# Patient Record
Sex: Male | Born: 1955
Health system: Southern US, Community
[De-identification: ages and names within clinical notes are randomized; demographics above are authoritative.]

## PROBLEM LIST (undated history)

## (undated) DIAGNOSIS — J449 Chronic obstructive pulmonary disease, unspecified: Secondary | ICD-10-CM

## (undated) DIAGNOSIS — R7303 Prediabetes: Secondary | ICD-10-CM

## (undated) DIAGNOSIS — J189 Pneumonia, unspecified organism: Secondary | ICD-10-CM

## (undated) DIAGNOSIS — Z87442 Personal history of urinary calculi: Secondary | ICD-10-CM

## (undated) DIAGNOSIS — N200 Calculus of kidney: Secondary | ICD-10-CM

## (undated) DIAGNOSIS — T7840XA Allergy, unspecified, initial encounter: Secondary | ICD-10-CM

## (undated) DIAGNOSIS — G4733 Obstructive sleep apnea (adult) (pediatric): Secondary | ICD-10-CM

## (undated) DIAGNOSIS — D649 Anemia, unspecified: Secondary | ICD-10-CM

## (undated) DIAGNOSIS — G4730 Sleep apnea, unspecified: Secondary | ICD-10-CM

## (undated) DIAGNOSIS — N281 Cyst of kidney, acquired: Secondary | ICD-10-CM

## (undated) HISTORY — DX: Anemia, unspecified: D64.9

## (undated) HISTORY — DX: Allergy, unspecified, initial encounter: T78.40XA

## (undated) HISTORY — DX: Sleep apnea, unspecified: G47.30

## (undated) HISTORY — PX: TONSILLECTOMY: SUR1361

---

## 1994-04-28 HISTORY — PX: UVULOPALATOPHARYNGOPLASTY: SHX827

## 1998-04-07 ENCOUNTER — Ambulatory Visit: Admission: RE | Admit: 1998-04-07 | Discharge: 1998-04-07 | Payer: Self-pay | Admitting: *Deleted

## 1998-06-08 ENCOUNTER — Ambulatory Visit (HOSPITAL_COMMUNITY): Admission: RE | Admit: 1998-06-08 | Discharge: 1998-06-09 | Payer: Self-pay | Admitting: *Deleted

## 1998-09-29 ENCOUNTER — Ambulatory Visit: Admission: RE | Admit: 1998-09-29 | Discharge: 1998-09-29 | Payer: Self-pay | Admitting: *Deleted

## 1999-09-02 ENCOUNTER — Encounter: Payer: Self-pay | Admitting: Family Medicine

## 1999-09-02 ENCOUNTER — Encounter: Admission: RE | Admit: 1999-09-02 | Discharge: 1999-09-02 | Payer: Self-pay | Admitting: Family Medicine

## 2000-01-27 HISTORY — PX: OTHER SURGICAL HISTORY: SHX169

## 2000-02-24 ENCOUNTER — Encounter: Payer: Self-pay | Admitting: Emergency Medicine

## 2000-02-24 ENCOUNTER — Observation Stay (HOSPITAL_COMMUNITY): Admission: EM | Admit: 2000-02-24 | Discharge: 2000-02-25 | Payer: Self-pay | Admitting: Urology

## 2000-04-19 ENCOUNTER — Emergency Department (HOSPITAL_COMMUNITY): Admission: EM | Admit: 2000-04-19 | Discharge: 2000-04-19 | Payer: Self-pay | Admitting: Emergency Medicine

## 2000-04-20 ENCOUNTER — Emergency Department (HOSPITAL_COMMUNITY): Admission: EM | Admit: 2000-04-20 | Discharge: 2000-04-20 | Payer: Self-pay | Admitting: Emergency Medicine

## 2002-07-06 ENCOUNTER — Encounter: Payer: Self-pay | Admitting: Family Medicine

## 2002-07-06 ENCOUNTER — Encounter: Admission: RE | Admit: 2002-07-06 | Discharge: 2002-07-06 | Payer: Self-pay | Admitting: Family Medicine

## 2011-02-24 ENCOUNTER — Encounter: Payer: 59 | Attending: Family Medicine | Admitting: *Deleted

## 2011-02-24 ENCOUNTER — Encounter: Payer: Self-pay | Admitting: *Deleted

## 2011-02-24 DIAGNOSIS — Z713 Dietary counseling and surveillance: Secondary | ICD-10-CM | POA: Insufficient documentation

## 2011-02-24 NOTE — Patient Instructions (Addendum)
Goals:  1500-1600 calories per day choosing more whole grains, lean protein, low fat dairy, and fruits/veggies.  Eat 3 meals and 2 snacks a day, Avoid meal skipping.   Increase protein rich foods to all meals/snacks.   Aim for >30 min of physical activity daily (as able).  Limit sugar-sweetened beverages, concentrated sweets, and high fat/fried foods.

## 2011-02-24 NOTE — Progress Notes (Signed)
  Medical Nutrition Therapy:  Appt start time: 1100 end time:  1200.  Assessment:  Obesity/Wt Mgmt.  Pt works night shift 3 days/wk and reports daily meal skipping d/t decreased appetite since starting Nexium.  Getting over pneumonia and slight wheezing noted in office.  Pt reports many recent dietary changes, though still consume excessive CHO as sweet tea and cranberry juice on a daily basis.  Excessive sodium intake also noted through frozen/processed foods and meals away from home.  Reports previous exercise of 20-30 min daily, but none since developing pneumonia.  Some pain (2/5) noted at this time.  MEDICATIONS: Dulera, Nexium, Nasonex   DIETARY INTAKE:  Usual eating pattern includes 2 meals and 1 snack per day.  Avoids spicy foods and very high fat/fried foods d/t GERD.   24-hr recall:  B (8-8:30 AM): Wheetabix cereal w/ 2% milk, small banana; sweet tea or cj Snk ( AM): None - sleeping  L ( PM): Lean Cuisine OR pb sandwiches, (prev = 12" steak and cheese - [employer] Snk ( PM): graham crackers; swt tea, water, or cj w/ water D ( PM): Brown rice cup Snk ( PM): none Beverages: cranberry juice w/ water, sweet tea   Usual physical activity: None  Estimated energy needs: 1500-1600 calories 185-200 g carbohydrates 110-120 g protein 35 g fat  Progress Towards Goal(s):  In progress.   Nutritional Diagnosis:  [place]-3.3 Morbid obesity related to history of excessive CHO and fat intake as evidenced by patient reported dietary intake history, 24-hr recall, and a BMI of 41.9 kg/m^2.    Intervention/Goals:  1500-1600 calories per day choosing more whole grains, lean protein, low fat dairy, and fruits/veggies.  Eat 3 meals and 2 snacks a day, Avoid meal skipping.   Increase protein rich foods to all meals/snacks.   Aim for >30 min of physical activity daily (as able).  Limit sugar-sweetened beverages, concentrated sweets, and high fat/fried foods.  Monitoring/Evaluation:  Dietary intake,  exercise, and body weight in 3 month(s).

## 2011-02-25 ENCOUNTER — Encounter: Payer: Self-pay | Admitting: *Deleted

## 2011-03-11 ENCOUNTER — Encounter (HOSPITAL_COMMUNITY): Payer: Self-pay | Admitting: Emergency Medicine

## 2011-03-11 ENCOUNTER — Emergency Department (HOSPITAL_COMMUNITY): Payer: 59

## 2011-03-11 ENCOUNTER — Emergency Department (HOSPITAL_COMMUNITY)
Admission: EM | Admit: 2011-03-11 | Discharge: 2011-03-11 | Disposition: A | Discharge status: Home / Self Care | Payer: 59 | Attending: Emergency Medicine | Admitting: Emergency Medicine

## 2011-03-11 DIAGNOSIS — J4489 Other specified chronic obstructive pulmonary disease: Secondary | ICD-10-CM | POA: Insufficient documentation

## 2011-03-11 DIAGNOSIS — R109 Unspecified abdominal pain: Secondary | ICD-10-CM | POA: Insufficient documentation

## 2011-03-11 DIAGNOSIS — J984 Other disorders of lung: Secondary | ICD-10-CM | POA: Insufficient documentation

## 2011-03-11 DIAGNOSIS — N200 Calculus of kidney: Secondary | ICD-10-CM | POA: Insufficient documentation

## 2011-03-11 DIAGNOSIS — G4730 Sleep apnea, unspecified: Secondary | ICD-10-CM | POA: Insufficient documentation

## 2011-03-11 DIAGNOSIS — R911 Solitary pulmonary nodule: Secondary | ICD-10-CM

## 2011-03-11 DIAGNOSIS — J449 Chronic obstructive pulmonary disease, unspecified: Secondary | ICD-10-CM | POA: Insufficient documentation

## 2011-03-11 DIAGNOSIS — R110 Nausea: Secondary | ICD-10-CM | POA: Insufficient documentation

## 2011-03-11 DIAGNOSIS — Z87442 Personal history of urinary calculi: Secondary | ICD-10-CM | POA: Insufficient documentation

## 2011-03-11 LAB — URINALYSIS, ROUTINE W REFLEX MICROSCOPIC
Bilirubin Urine: NEGATIVE
Glucose, UA: NEGATIVE mg/dL
Ketones, ur: NEGATIVE mg/dL
Leukocytes, UA: NEGATIVE
Nitrite: NEGATIVE
Protein, ur: NEGATIVE mg/dL
Specific Gravity, Urine: 1.02 (ref 1.005–1.030)
Urobilinogen, UA: 0.2 mg/dL (ref 0.0–1.0)
pH: 5.5 (ref 5.0–8.0)

## 2011-03-11 LAB — URINE MICROSCOPIC-ADD ON

## 2011-03-11 LAB — POCT I-STAT, CHEM 8
BUN: 13 mg/dL (ref 6–23)
Calcium, Ion: 1.11 mmol/L — ABNORMAL LOW (ref 1.12–1.32)
Chloride: 109 mEq/L (ref 96–112)

## 2011-03-11 MED ORDER — ONDANSETRON HCL 4 MG/2ML IJ SOLN
4.0000 mg | Freq: Once | INTRAMUSCULAR | Status: AC
  Administered 2011-03-11: 4 mg via INTRAVENOUS
  Filled 2011-03-11: qty 2

## 2011-03-11 MED ORDER — OXYCODONE-ACETAMINOPHEN 5-325 MG PO TABS
2.0000 | ORAL_TABLET | Freq: Once | ORAL | Status: AC
  Administered 2011-03-11: 2 via ORAL
  Filled 2011-03-11: qty 2

## 2011-03-11 MED ORDER — OXYCODONE-ACETAMINOPHEN 5-325 MG PO TABS: 2.0000 | ORAL_TABLET | ORAL | Status: AC | PRN

## 2011-03-11 MED ORDER — KETOROLAC TROMETHAMINE 30 MG/ML IJ SOLN
30.0000 mg | Freq: Once | INTRAMUSCULAR | Status: AC
  Administered 2011-03-11: 30 mg via INTRAVENOUS
  Filled 2011-03-11: qty 1

## 2011-03-11 MED ORDER — TAMSULOSIN HCL 0.4 MG PO CAPS: 0.4000 mg | ORAL_CAPSULE | Freq: Every day | ORAL | Status: DC

## 2011-03-11 NOTE — ED Provider Notes (Signed)
History     CSN: [id number] Arrival date & time: 03/11/2011  1:41 AM   First MD Initiated Contact with Patient 03/11/11 0153      Chief Complaint  Patient presents with  . Flank Pain   HPI History provided by the patient. Patient presents with complaints of acute onset left lower flank and groin pain similar to previous kidney stone symptoms. 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 Patient works as a Buyer, retail. Pain then became acutely more severe about 30 minutes prior to coming into the ED. She reports slight nausea but denies any vomiting. No fever, chills, sweats, constipation, diarrhea. Patient denies any dysuria or hematuria. Patient has no other significant medical history.   Past Medical History  Diagnosis Date  . Sleep apnea   . Obesity, Class III, BMI 40-49.9 (morbid obesity)   . COPD (chronic obstructive pulmonary disease)   . Kidney stones   . Sleep apnea     Past Surgical History  Procedure Date  . Rhinoplasty     Family History  Problem Relation Age of Onset  . Cancer Other   . Obesity Other     History  Substance Use Topics  . Smoking status: Former Smoker    Types: Cigarettes  . Smokeless tobacco: Not on file  . Alcohol Use: No      Review of Systems  Constitutional: Negative for fever and chills.  Respiratory: Negative for cough and shortness of breath.   Cardiovascular: Negative for chest pain.  Gastrointestinal: Negative for nausea, vomiting, abdominal pain, diarrhea and constipation.  Genitourinary: Positive for flank pain. Negative for dysuria, frequency and hematuria.  Skin: Negative for rash.  All other systems reviewed and are negative.    Allergies  Review of patient's allergies indicates no known allergies.  Home Medications   Current Outpatient Rx  Name Route Sig Dispense Refill  . ESOMEPRAZOLE MAGNESIUM 40 MG PO CPDR Oral Take 40 mg by mouth daily before breakfast.      . MOMETASONE FUROATE 50 MCG/ACT NA SUSP Nasal Place 2 sprays into the nose daily.     . MOMETASONE FURO-FORMOTEROL FUM 100-5 MCG/ACT IN AERO Inhalation Inhale 2 puffs into the lungs 2 (two) times daily.       BP 111/56  Pulse 63  Temp(Src) 98.5 F (36.9 C) (Oral)  Resp 18  SpO2 98%  Physical Exam  Nursing note and vitals reviewed. Constitutional: He is oriented to person, place, and time. He appears well-developed and well-nourished. No distress.       Uncomfortable appearing  HENT:  Head: Normocephalic and atraumatic.  Cardiovascular: Normal rate and regular rhythm.   No murmur heard. Pulmonary/Chest: Effort normal and breath sounds normal. He has no wheezes. He has no rales.  Abdominal: Soft. Bowel sounds are normal. There is tenderness in the left lower quadrant. There is no rebound, no guarding and no CVA tenderness.  Neurological: He is alert and oriented to person, place, and time.  Skin: Skin is warm. No rash noted.  Psychiatric: He has a normal mood and affect.    ED Course  Procedures (including critical care time)  Labs Reviewed  POCT I-STAT, CHEM 8 - Abnormal; Notable for the following:    Glucose, Bld 157 (*)    Calcium, Ion 1.11 (*)    All other components within normal limits  URINALYSIS, ROUTINE W REFLEX MICROSCOPIC  I-STAT, CHEM 8    Results for orders placed during  the hospital encounter of 03/11/11  URINALYSIS, ROUTINE W REFLEX MICROSCOPIC      Component Value Range   Color, Urine YELLOW  YELLOW    Appearance CLEAR  CLEAR    Specific Gravity, Urine 1.020  1.005 - 1.030    pH 5.5  5.0 - 8.0    Glucose, UA NEGATIVE  NEGATIVE (mg/dL)   Hgb urine dipstick LARGE (*) NEGATIVE    Bilirubin Urine NEGATIVE  NEGATIVE    Ketones, ur NEGATIVE  NEGATIVE (mg/dL)   Protein, ur NEGATIVE  NEGATIVE (mg/dL)   Urobilinogen, UA 0.2  0.0 - 1.0 (mg/dL)   Nitrite NEGATIVE  NEGATIVE    Leukocytes, UA NEGATIVE  NEGATIVE   POCT I-STAT, CHEM 8      Component Value Range    Sodium 144  135 - 145 (mEq/L)   Potassium 3.7  3.5 - 5.1 (mEq/L)   Chloride 109  96 - 112 (mEq/L)   BUN 13  6 - 23 (mg/dL)   Creatinine, Ser 1.61  0.50 - 1.35 (mg/dL)   Glucose, Bld 096 (*) 70 - 99 (mg/dL)   Calcium, Ion 0.45 (*) 1.12 - 1.32 (mmol/L)   TCO2 25  0 - 100 (mmol/L)   Hemoglobin 13.6  13.0 - 17.0 (g/dL)   HCT 40.9  81.1 - 91.4 (%)  URINE MICROSCOPIC-ADD ON      Component Value Range   Squamous Epithelial / LPF FEW (*) RARE    WBC, UA 0-2  <3 (WBC/hpf)   RBC / HPF 21-50  <3 (RBC/hpf)   Bacteria, UA FEW (*) RARE    Urine-Other MUCOUS PRESENT       Ct Abdomen Pelvis Wo Contrast  03/11/2011  *RADIOLOGY REPORT*  Clinical Data: Left-sided flank pain; difficulty urinating.  CT ABDOMEN AND PELVIS WITHOUT CONTRAST  Technique:  Multidetector CT imaging of the abdomen and pelvis was performed following the standard protocol without intravenous contrast.  Comparison: None.  Findings: Minimal bibasilar atelectasis is noted.  Scattered small peripheral nodules are noted at the left lung base; these measure up to 7 mm in size (images 1, 3 and 12 of 25).  Some of this may reflect nodular atelectasis.  The liver and spleen are unremarkable in appearance.  The gallbladder is within normal limits.  The pancreas and adrenal glands are unremarkable.  There is mild left-sided hydronephrosis, with left-sided perinephric stranding and fluid, and prominence of the left ureter to the level of an obstructing 6 x 3 mm stone in the distal left ureter, 3 cm proximal to the left vesicoureteral junction.  Diffuse stranding is noted along the course of the left ureter.  Given the degree of soft tissue stranding, underlying pyelonephritis cannot be excluded.  A 2.1 cm hypodensity within the interpole region of the right kidney likely reflects a cyst.  The right kidney is otherwise unremarkable in appearance.  No nonobstructing renal stones are seen on either side.  No free fluid is identified.  The small bowel is  unremarkable in appearance.  The stomach is within normal limits.  No acute vascular abnormalities are seen.  There is nonspecific mild diffuse haziness within the proximal mesentery; no significant mesenteric lymphadenopathy is seen.  The appendix is normal in caliber, without evidence for appendicitis.  The colon is relatively decompressed and is unremarkable in appearance.  The bladder is mildly distended; mild soft tissue stranding about the bladder raises question for cystitis.  The prostate remains normal in size, with minimal calcification.  No inguinal  lymphadenopathy is seen.  No acute osseous abnormalities are identified.  IMPRESSION:  1.  Mild left-sided hydronephrosis, with left-sided perinephric stranding and fluid, and an obstructing 6 x 3 mm stone in the distal left ureter, 3 cm proximal to the left vesicoureteral junction. 2.  Given the degree of soft tissue stranding along the left ureter and about the kidney, underlying pyelonephritis and ureteritis cannot be excluded. 3.  Suspect cystitis, given mild soft tissue stranding about the bladder. 4.  Right renal cyst noted. 5.  Nonspecific mild diffuse haziness within the proximal mesentery; no evidence of lymphadenopathy or mass. 6.  Small peripheral nodules noted at the left lung base, measuring up to 7 mm in size.  If the patient is at high risk for bronchogenic carcinoma, follow-up chest CT at 3-6 months is recommended.  If the patient is at low risk for bronchogenic carcinoma, follow-up chest CT at 6-12 months is recommended.  This recommendation follows the consensus statement: Guidelines for Management of Small Pulmonary Nodules Detected on CT Scans: A Statement from the Fleischner Society as published in Radiology 2005; 237:395-400. Online at: DietDisorder.cz.  Original Report Authenticated By: Tonia Ghent, M.D.     1. Kidney stone   2. Lung nodule       MDM  Patient seen and evaluated. In no  acute distress. Will begin workup to rule out possible kidney stone.  Patient feeling better after Toradol. He has been resting comfortably and asleep. CT scan shows mild left hydronephrosis. There is a distal 6 x 3 mm stone in the left ureter.     Angus Seller, [place] 03/11/11 [phone]

## 2011-03-11 NOTE — ED Notes (Signed)
Alert, interactive, calm, intermittantly resting, "pain returning", pain med given, urine sent. Pending disposition & urine results.

## 2011-03-11 NOTE — ED Provider Notes (Signed)
Medical screening examination/treatment/procedure(s) were performed by non-physician practitioner and as supervising physician I was immediately available for consultation/collaboration.   Lyanne Co, MD 03/11/11 [phone]

## 2011-03-11 NOTE — ED Notes (Signed)
Back from CT

## 2011-03-11 NOTE — ED Notes (Signed)
PT. REPORTS LEFT FLANK PAIN WITH NAUSEA ONSET THIS EVENING , DENIES DYSURIA OR HEMATURIA .  NO DIARRHEA ,FEVER OR CHILLS.

## 2011-03-13 LAB — URINE CULTURE: Culture  Setup Time: 201211140735

## 2011-05-27 ENCOUNTER — Ambulatory Visit: Payer: 59 | Admitting: *Deleted

## 2011-12-04 ENCOUNTER — Encounter (HOSPITAL_COMMUNITY): Payer: Self-pay

## 2011-12-04 ENCOUNTER — Emergency Department (INDEPENDENT_AMBULATORY_CARE_PROVIDER_SITE_OTHER)
Admission: EM | Admit: 2011-12-04 | Discharge: 2011-12-04 | Disposition: A | Discharge status: Home / Self Care | Payer: 59 | Source: Home / Self Care | Attending: Emergency Medicine | Admitting: Emergency Medicine

## 2011-12-04 DIAGNOSIS — J40 Bronchitis, not specified as acute or chronic: Secondary | ICD-10-CM

## 2011-12-04 DIAGNOSIS — H109 Unspecified conjunctivitis: Secondary | ICD-10-CM

## 2011-12-04 MED ORDER — MOMETASONE FUROATE 50 MCG/ACT NA SUSP: 2.0000 | Freq: Every day | NASAL | Status: DC

## 2011-12-04 MED ORDER — DOXYCYCLINE HYCLATE 100 MG PO CAPS: 100.0000 mg | ORAL_CAPSULE | Freq: Two times a day (BID) | ORAL | Status: AC

## 2011-12-04 MED ORDER — TOBRAMYCIN 0.3 % OP SOLN: 1.0000 [drp] | Freq: Four times a day (QID) | OPHTHALMIC | Status: AC

## 2011-12-04 NOTE — ED Provider Notes (Signed)
History     CSN: [id number]  Arrival date & time 12/04/11  1101   First MD Initiated Contact with Patient 12/04/11 1106      Chief Complaint  Patient presents with  . Sore Throat  . URI  . Eye Problem    (Consider location/radiation/quality/duration/timing/severity/associated sxs/prior treatment) HPI Comments: Patient presents urgent care this morning complaining of a sore throat nasal congestion drainage and redness of both of his eyes he is been having a productive cough of green yellowish sputum for several days now. Also experiencing some nasal congestion and minimal pressure on both maxillary sinuses secretions from his nose have also look greenish in character. Have been taking Zyrtec without any improvement.  Patient is a 56 y.o. male presenting with pharyngitis, URI, and eye problem. The history is provided by the patient.  Sore Throat This is a new problem. The current episode started more than 2 days ago. The problem occurs constantly. The problem has been gradually worsening. Pertinent negatives include no chest pain, no abdominal pain, no headaches and no shortness of breath. He has tried nothing for the symptoms. The treatment provided no relief.  URI The primary symptoms include sore throat and cough. Primary symptoms do not include fever, fatigue, headaches, abdominal pain, myalgias, arthralgias or rash.  The sore throat is not accompanied by trouble swallowing.  Symptoms associated with the illness include congestion. The illness is not associated with chills or sinus pressure.  Eye Problem  Pertinent negatives include no weakness.    Past Medical History  Diagnosis Date  . Sleep apnea   . Obesity, Class III, BMI 40-49.9 (morbid obesity)   . COPD (chronic obstructive pulmonary disease)   . Kidney stones   . Sleep apnea     Past Surgical History  Procedure Date  . Rhinoplasty     Family History  Problem Relation Age of Onset  . Cancer Other   . Obesity  Other     History  Substance Use Topics  . Smoking status: Former Smoker    Types: Cigarettes  . Smokeless tobacco: Not on file  . Alcohol Use: No      Review of Systems  Constitutional: Negative for fever, chills, activity change, appetite change and fatigue.  HENT: Positive for congestion and sore throat. Negative for mouth sores, trouble swallowing, dental problem, voice change and sinus pressure.   Respiratory: Positive for cough. Negative for apnea and shortness of breath.   Cardiovascular: Negative for chest pain.  Gastrointestinal: Negative for abdominal pain.  Musculoskeletal: Negative for myalgias and arthralgias.  Skin: Negative for rash.  Neurological: Negative for weakness and headaches.    Allergies  Review of patient's allergies indicates no known allergies.  Home Medications   Current Outpatient Rx  Name Route Sig Dispense Refill  . MOMETASONE FURO-FORMOTEROL FUM 100-5 MCG/ACT IN AERO Inhalation Inhale 2 puffs into the lungs 2 (two) times daily.     Marland Kitchen DOXYCYCLINE HYCLATE 100 MG PO CAPS Oral Take 1 capsule (100 mg total) by mouth 2 (two) times daily. 20 capsule 0  . ESOMEPRAZOLE MAGNESIUM 40 MG PO CPDR Oral Take 40 mg by mouth daily before breakfast.     . MOMETASONE FUROATE 50 MCG/ACT NA SUSP Nasal Place 2 sprays into the nose daily. 17 g 1  . TAMSULOSIN HCL 0.4 MG PO CAPS Oral Take 1 capsule (0.4 mg total) by mouth daily. 15 capsule 0  . TOBRAMYCIN SULFATE 0.3 % OP SOLN Both Eyes Place 1 drop into  both eyes every 6 (six) hours. 5 mL 0    BP 130/82  Pulse 88  Temp 98.7 F (37.1 C) (Oral)  Resp 20  SpO2 97%  Physical Exam  Vitals reviewed. Constitutional: He appears well-developed and well-nourished.  HENT:  Head: Normocephalic.  Mouth/Throat: Uvula is midline. Posterior oropharyngeal erythema present. No oropharyngeal exudate, posterior oropharyngeal edema or tonsillar abscesses.  Eyes: EOM are normal. Pupils are equal, round, and reactive to light.  No foreign bodies found. Right eye exhibits no discharge. Left eye exhibits no discharge. Right conjunctiva is injected. Right conjunctiva has no hemorrhage. Left conjunctiva is injected. Left conjunctiva has no hemorrhage. No scleral icterus.  Cardiovascular: Normal rate and regular rhythm.   Pulmonary/Chest: Effort normal and breath sounds normal. No respiratory distress. He has no wheezes. He has no rales. He exhibits no tenderness.    ED Course  Procedures (including critical care time)  Labs Reviewed - No data to display No results found.   1. Conjunctivitis   2. Bronchitis       MDM  Patient describes that he has been told by his primary care Dr. that he has mild COPD. Reports productive phlegm and with a clinical CONJUNCTIVITIS. Patient has been prescribed ophthalmic tobramycin along with a doxycycline prescription patient also requested a mometasone nasal spray refill. Advise patient about symptoms that should warrant further evaluation or she is to return        Jimmie Molly, MD 12/04/11 2046

## 2011-12-04 NOTE — ED Notes (Signed)
C/o sore throat, nasal congestion, redness and drainage both eyes and productive cough of green sputum.  Also reports yellowish-green nasal secretions.  Taking zyrtec without improvement.

## 2012-05-06 ENCOUNTER — Other Ambulatory Visit: Payer: Self-pay | Admitting: Physician Assistant

## 2012-05-06 NOTE — Telephone Encounter (Signed)
Chart pulled at [facility] [phone]

## 2012-05-10 ENCOUNTER — Other Ambulatory Visit: Payer: Self-pay | Admitting: Physician Assistant

## 2012-11-11 ENCOUNTER — Ambulatory Visit (INDEPENDENT_AMBULATORY_CARE_PROVIDER_SITE_OTHER): Payer: 59 | Admitting: Emergency Medicine

## 2012-11-11 VITALS — BP 107/75 | HR 73 | Temp 98.2°F | Resp 18 | Ht 69.5 in | Wt 292.0 lb

## 2012-11-11 DIAGNOSIS — N200 Calculus of kidney: Secondary | ICD-10-CM

## 2012-11-11 DIAGNOSIS — N3090 Cystitis, unspecified without hematuria: Secondary | ICD-10-CM

## 2012-11-11 DIAGNOSIS — N3000 Acute cystitis without hematuria: Secondary | ICD-10-CM

## 2012-11-11 LAB — POCT URINALYSIS DIPSTICK
Bilirubin, UA: NEGATIVE
Ketones, UA: NEGATIVE
pH, UA: 7

## 2012-11-11 LAB — POCT UA - MICROSCOPIC ONLY

## 2012-11-11 MED ORDER — PHENAZOPYRIDINE HCL 200 MG PO TABS: 200.0000 mg | ORAL_TABLET | Freq: Three times a day (TID) | ORAL | Status: DC | PRN

## 2012-11-11 MED ORDER — TAMSULOSIN HCL 0.4 MG PO CAPS
0.4000 mg | ORAL_CAPSULE | Freq: Every day | ORAL | Status: DC
Start: 2012-11-11 — End: 2014-07-29

## 2012-11-11 MED ORDER — CIPROFLOXACIN HCL 500 MG PO TABS: 500.0000 mg | ORAL_TABLET | Freq: Two times a day (BID) | ORAL | Status: DC

## 2012-11-11 NOTE — Progress Notes (Signed)
[facility] [address] [place] [address] [phone]  Date:  11/11/2012   Name:  Zachary Jacobs   DOB:  [birth year]   MRN:  [id number]  PCP:  No PCP Per Patient    Chief Complaint: Abdominal Pain and Urinary Retention   History of Present Illness:  Zachary Jacobs is a 57 y.o. very pleasant male patient who presents with the following:  History of kidney stones two times in the past.  Developed pain in the left flank Saturday that has been persistent until Tuesday when he passed two stones.  Now has persistent pain and aches and dark urine.  No fever or chills today.  Nauseated.  Concerned that he might have an infection.  No improvement with over the counter medications or other home remedies. Denies other complaint or health concern today.   There are no active problems to display for this patient.   Past Medical History  Diagnosis Date  . Sleep apnea   . Obesity, Class III, BMI 40-49.9 (morbid obesity)   . COPD (chronic obstructive pulmonary disease)   . Kidney stones   . Sleep apnea   . Sleep apnea     Past Surgical History  Procedure Laterality Date  . Rhinoplasty      History  Substance Use Topics  . Smoking status: Former Smoker    Types: Cigarettes  . Smokeless tobacco: Not on file  . Alcohol Use: No    Family History  Problem Relation Age of Onset  . Cancer Other   . Obesity Other   . Cancer Mother   . Heart disease Father     No Known Allergies  Medication list has been reviewed and updated.  Current Outpatient Prescriptions on File Prior to Visit  Medication Sig Dispense Refill  . esomeprazole (NEXIUM) 40 MG capsule Take 40 mg by mouth daily before breakfast.       . mometasone (NASONEX) 50 MCG/ACT nasal spray Place 2 sprays into the nose daily.  17 g  1  . mometasone-formoterol (DULERA) 100-5 MCG/ACT AERO Inhale 2 puffs into the lungs 2 (two) times daily.       . Tamsulosin HCl (FLOMAX) 0.4 MG CAPS Take 1  capsule (0.4 mg total) by mouth daily.  15 capsule  0   No current facility-administered medications on file prior to visit.    Review of Systems:  As per HPI, otherwise negative.    Physical Examination: Filed Vitals:   11/11/12 1421  BP: 107/75  Pulse: 73  Temp: 98.2 F (36.8 C)  Resp: 18   Filed Vitals:   11/11/12 1421  Height: 5' 9.5" (1.765 m)  Weight: 292 lb (132.45 kg)   Body mass index is 42.52 kg/(m^2). Ideal Body Weight: Weight in (lb) to have BMI = 25: 171.4  GEN: WDWN, NAD, Non-toxic, A & O x 3 HEENT: Atraumatic, Normocephalic. Neck supple. No masses, No LAD. Ears and Nose: No external deformity. CV: RRR, No M/G/R. No JVD. No thrill. No extra heart sounds. PULM: CTA B, no wheezes, crackles, rhonchi. No retractions. No resp. distress. No accessory muscle use. ABD: S, NT, ND, +BS. No rebound. No HSM. EXTR: No c/c/e NEURO Normal gait.  PSYCH: Normally interactive. Conversant. Not depressed or anxious appearing.  Calm demeanor.    Assessment and Plan: Cystitis cipro Pyridium   Signed,  Phillips Odor, MD   Results for orders placed in visit on 11/11/12  POCT URINALYSIS DIPSTICK  Result Value Range   Color, UA orange     Clarity, UA turbin     Glucose, UA neg     Bilirubin, UA neg     Ketones, UA neg     Spec Grav, UA 1.020     Blood, UA mod     pH, UA 7.0     Protein, UA 100     Urobilinogen, UA 0.2     Nitrite, UA neg     Leukocytes, UA large (3+)    POCT UA - MICROSCOPIC ONLY      Result Value Range   WBC, Ur, HPF, POC tntc     RBC, urine, microscopic tntc     Bacteria, U Microscopic 2+     Mucus, UA mod     Epithelial cells, urine per micros 0-2     Crystals, Ur, HPF, POC neg     Casts, Ur, LPF, POC neg     Yeast, UA neg

## 2012-11-11 NOTE — Addendum Note (Signed)
Addended by: Carmelina Dane on: 11/11/2012 03:17 PM   Modules accepted: Orders

## 2012-11-11 NOTE — Patient Instructions (Addendum)
Urinary Tract Infection  Urinary tract infections (UTIs) can develop anywhere along your urinary tract. Your urinary tract is your body's drainage system for removing wastes and extra water. Your urinary tract includes two kidneys, two ureters, a bladder, and a urethra. Your kidneys are a pair of bean-shaped organs. Each kidney is about the size of your fist. They are located below your ribs, one on each side of your spine.  CAUSES  Infections are caused by microbes, which are microscopic organisms, including fungi, viruses, and bacteria. These organisms are so small that they can only be seen through a microscope. Bacteria are the microbes that most commonly cause UTIs.  SYMPTOMS   Symptoms of UTIs may vary by age and gender of the patient and by the location of the infection. Symptoms in young women typically include a frequent and intense urge to urinate and a painful, burning feeling in the bladder or urethra during urination. Older women and men are more likely to be tired, shaky, and weak and have muscle aches and abdominal pain. A fever may mean the infection is in your kidneys. Other symptoms of a kidney infection include pain in your back or sides below the ribs, nausea, and vomiting.  DIAGNOSIS  To diagnose a UTI, your caregiver will ask you about your symptoms. Your caregiver also will ask to provide a urine sample. The urine sample will be tested for bacteria and white blood cells. White blood cells are made by your body to help fight infection.  TREATMENT   Typically, UTIs can be treated with medication. Because most UTIs are caused by a bacterial infection, they usually can be treated with the use of antibiotics. The choice of antibiotic and length of treatment depend on your symptoms and the type of bacteria causing your infection.  HOME CARE INSTRUCTIONS   If you were prescribed antibiotics, take them exactly as your caregiver instructs you. Finish the medication even if you feel better after you  have only taken some of the medication.   Drink enough water and fluids to keep your urine clear or pale yellow.   Avoid caffeine, tea, and carbonated beverages. They tend to irritate your bladder.   Empty your bladder often. Avoid holding urine for long periods of time.   Empty your bladder before and after sexual intercourse.   After a bowel movement, women should cleanse from front to back. Use each tissue only once.  SEEK MEDICAL CARE IF:    You have back pain.   You develop a fever.   Your symptoms do not begin to resolve within 3 days.  SEEK IMMEDIATE MEDICAL CARE IF:    You have severe back pain or lower abdominal pain.   You develop chills.   You have nausea or vomiting.   You have continued burning or discomfort with urination.  MAKE SURE YOU:    Understand these instructions.   Will watch your condition.   Will get help right away if you are not doing well or get worse.  Document Released: 01/22/2005 Document Revised: 10/14/2011 Document Reviewed: 05/23/2011  [employer] Patient Information 2014 [employer]

## 2013-06-26 ENCOUNTER — Ambulatory Visit (INDEPENDENT_AMBULATORY_CARE_PROVIDER_SITE_OTHER): Payer: 59 | Admitting: Emergency Medicine

## 2013-06-26 VITALS — BP 136/74 | HR 68 | Temp 98.1°F | Resp 18 | Ht 69.0 in | Wt 300.6 lb

## 2013-06-26 DIAGNOSIS — H66009 Acute suppurative otitis media without spontaneous rupture of ear drum, unspecified ear: Secondary | ICD-10-CM

## 2013-06-26 MED ORDER — AMOXICILLIN-POT CLAVULANATE 875-125 MG PO TABS: 1.0000 | ORAL_TABLET | Freq: Two times a day (BID) | ORAL | Status: DC

## 2013-06-26 MED ORDER — ESOMEPRAZOLE MAGNESIUM 40 MG PO CPDR: 40.0000 mg | DELAYED_RELEASE_CAPSULE | Freq: Every day | ORAL | Status: DC

## 2013-06-26 MED ORDER — PSEUDOEPHEDRINE-GUAIFENESIN ER 60-600 MG PO TB12: 1.0000 | ORAL_TABLET | Freq: Two times a day (BID) | ORAL | Status: DC

## 2013-06-26 MED ORDER — MOMETASONE FURO-FORMOTEROL FUM 100-5 MCG/ACT IN AERO: 2.0000 | INHALATION_SPRAY | Freq: Two times a day (BID) | RESPIRATORY_TRACT | Status: DC

## 2013-06-26 MED ORDER — PSEUDOEPHEDRINE-GUAIFENESIN ER 60-600 MG PO TB12: 1.0000 | ORAL_TABLET | Freq: Two times a day (BID) | ORAL | Status: AC

## 2013-06-26 MED ORDER — ACETAMINOPHEN-CODEINE #3 300-30 MG PO TABS: 1.0000 | ORAL_TABLET | ORAL | Status: DC | PRN

## 2013-06-26 NOTE — Patient Instructions (Signed)
Otitis Media With Effusion Otitis media with effusion is the presence of fluid in the middle ear. This is a common problem in children, which often follows ear infections. It may be present for weeks or longer after the infection. Unlike an acute ear infection, otitis media with effusion refers only to fluid behind the ear drum and not infection. Children with repeated ear and sinus infections and allergy problems are the most likely to get otitis media with effusion. CAUSES  The most frequent cause of the fluid buildup is dysfunction of the eustachian tubes. These are the tubes that drain fluid in the ears to the to the back of the nose (nasopharynx). SYMPTOMS   The main symptom of this condition is hearing loss. As a result, you or your child may:  Listen to the TV at a loud volume.  Not respond to questions.  Ask "what" often when spoken to.  Mistake or confuse on sound or word for another.  There may be a sensation of fullness or pressure but usually not pain. DIAGNOSIS   Your health care provider will diagnose this condition by examining you or your child's ears.  Your health care provider may test the pressure in you or your child's ear with a tympanometer.  A hearing test may be conducted if the problem persists. TREATMENT   Treatment depends on the duration and the effects of the effusion.  Antibiotics, decongestants, nose drops, and cortisone-type drugs (tablets or nasal spray) may not be helpful.  Children with persistent ear effusions may have delayed language or behavioral problems. Children at risk for developmental delays in hearing, learning, and speech may require referral to a specialist earlier than children not at risk.  You or your child's health care provider may suggest a referral to an ear, nose, and throat surgeon for treatment. The following may help restore normal hearing:  Drainage of fluid.  Placement of ear tubes (tympanostomy tubes).  Removal of  adenoids (adenoidectomy). HOME CARE INSTRUCTIONS   Avoid second hand smoke.  Infants who are breast fed are less likely to have this condition.  Avoid feeding infants while laying flat.  Avoid known environmental allergens.  Avoid people who are sick. SEEK MEDICAL CARE IF:   Hearing is not better in 3 months.  Hearing is worse.  Ear pain.  Drainage from the ear.  Dizziness. MAKE SURE YOU:   Understand these instructions.  Will watch your condition.  Will get help right away if you are not doing well or get worse. Document Released: 05/22/2004 Document Revised: 02/02/2013 Document Reviewed: 11/09/2012 Exit[employer] Patient Information 2014 [employer].  

## 2013-06-26 NOTE — Progress Notes (Signed)
[facility] [address], [place] [address] [phone]  Date:  06/26/2013   Name:  Zachary Jacobs   DOB:  17-Jun-1955   MRN:  [id number]  PCP:  No PCP Per Patient    Chief Complaint: Otalgia   History of Present Illness:  Zachary Jacobs is a 58 y.o. very pleasant male patient who presents with the following:  Ill with nasal congestion clear in nature.  No fever or chills.  No cough, wheezing or shortness of breath.  Yesterday developed pain in the left ear.  No improvement with over the counter medications or other home remedies. Denies other complaint or health concern today.   There are no active problems to display for this patient.   Past Medical History  Diagnosis Date  . Sleep apnea   . Obesity, Class III, BMI 40-49.9 (morbid obesity)   . COPD (chronic obstructive pulmonary disease)   . Kidney stones   . Sleep apnea   . Sleep apnea     Past Surgical History  Procedure Laterality Date  . Rhinoplasty      History  Substance Use Topics  . Smoking status: Former Smoker    Types: Cigarettes  . Smokeless tobacco: Not on file  . Alcohol Use: No    Family History  Problem Relation Age of Onset  . Cancer Other   . Obesity Other   . Cancer Mother   . Heart disease Father     No Known Allergies  Medication list has been reviewed and updated.  Current Outpatient Prescriptions on File Prior to Visit  Medication Sig Dispense Refill  . esomeprazole (NEXIUM) 40 MG capsule Take 40 mg by mouth daily before breakfast.       . mometasone (NASONEX) 50 MCG/ACT nasal spray Place 2 sprays into the nose daily.  17 g  1  . mometasone-formoterol (DULERA) 100-5 MCG/ACT AERO Inhale 2 puffs into the lungs 2 (two) times daily.       . tamsulosin (FLOMAX) 0.4 MG CAPS Take 1 capsule (0.4 mg total) by mouth daily.  15 capsule  0   No current facility-administered medications on file prior to visit.    Review of Systems:  As per HPI, otherwise  negative.    Physical Examination: Filed Vitals:   06/26/13 1343  BP: 136/74  Pulse: 68  Temp: 98.1 F (36.7 C)  Resp: 18   Filed Vitals:   06/26/13 1343  Height: 5\' 9"  (1.753 m)  Weight: 300 lb 9.6 oz (136.351 kg)   Body mass index is 44.37 kg/(m^2). Ideal Body Weight: Weight in (lb) to have BMI = 25: 168.9  GEN: WDWN, NAD, Non-toxic, A & O x 3 HEENT: Atraumatic, Normocephalic. Neck supple. No masses, No LAD. Ears and Nose: No external deformity.  Right OM CV: RRR, No M/G/R. No JVD. No thrill. No extra heart sounds. PULM: CTA B, no wheezes, crackles, rhonchi. No retractions. No resp. distress. No accessory muscle use. ABD: S, NT, ND, +BS. No rebound. No HSM. EXTR: No c/c/e NEURO Normal gait.  PSYCH: Normally interactive. Conversant. Not depressed or anxious appearing.  Calm demeanor.    Assessment and Plan: Right acute otitis medica augmentin muicnex d tyl #3  Signed,  Ellison Carwin, MD

## 2013-12-27 ENCOUNTER — Ambulatory Visit (INDEPENDENT_AMBULATORY_CARE_PROVIDER_SITE_OTHER): Payer: 59 | Admitting: Internal Medicine

## 2013-12-27 ENCOUNTER — Ambulatory Visit (INDEPENDENT_AMBULATORY_CARE_PROVIDER_SITE_OTHER): Payer: 59

## 2013-12-27 VITALS — BP 130/76 | HR 64 | Temp 97.9°F | Resp 16 | Ht 69.0 in | Wt 294.8 lb

## 2013-12-27 DIAGNOSIS — L259 Unspecified contact dermatitis, unspecified cause: Secondary | ICD-10-CM

## 2013-12-27 DIAGNOSIS — S96819A Strain of other specified muscles and tendons at ankle and foot level, unspecified foot, initial encounter: Secondary | ICD-10-CM

## 2013-12-27 DIAGNOSIS — M79672 Pain in left foot: Secondary | ICD-10-CM

## 2013-12-27 DIAGNOSIS — L03114 Cellulitis of left upper limb: Secondary | ICD-10-CM

## 2013-12-27 DIAGNOSIS — M79609 Pain in unspecified limb: Secondary | ICD-10-CM

## 2013-12-27 DIAGNOSIS — S93499A Sprain of other ligament of unspecified ankle, initial encounter: Secondary | ICD-10-CM

## 2013-12-27 DIAGNOSIS — S86012A Strain of left Achilles tendon, initial encounter: Secondary | ICD-10-CM

## 2013-12-27 DIAGNOSIS — IMO0002 Reserved for concepts with insufficient information to code with codable children: Secondary | ICD-10-CM

## 2013-12-27 LAB — POCT CBC
Granulocyte percent: 62.6 %G (ref 37–80)
HEMATOCRIT: 45.1 % (ref 43.5–53.7)
HEMOGLOBIN: 14.7 g/dL (ref 14.1–18.1)
LYMPH, POC: 2.5 (ref 0.6–3.4)
MCH: 30.7 pg (ref 27–31.2)
MCHC: 32.7 g/dL (ref 31.8–35.4)
MCV: 94.1 fL (ref 80–97)
MID (cbc): 1.1 — AB (ref 0–0.9)
MPV: 7.3 fL (ref 0–99.8)
POC Granulocyte: 6 (ref 2–6.9)
POC LYMPH PERCENT: 26.2 %L (ref 10–50)
POC MID %: 11.2 %M (ref 0–12)
Platelet Count, POC: 262 10*3/uL (ref 142–424)
RBC: 4.79 M/uL (ref 4.69–6.13)
RDW, POC: 14 %
WBC: 9.6 10*3/uL (ref 4.6–10.2)

## 2013-12-27 MED ORDER — DOXYCYCLINE HYCLATE 100 MG PO TABS: 100.0000 mg | ORAL_TABLET | Freq: Two times a day (BID) | ORAL | Status: DC

## 2013-12-27 MED ORDER — CLOBETASOL PROPIONATE 0.05 % EX OINT: TOPICAL_OINTMENT | CUTANEOUS | Status: DC

## 2013-12-27 MED ORDER — IBUPROFEN 600 MG PO TABS: 600.0000 mg | ORAL_TABLET | Freq: Three times a day (TID) | ORAL | Status: DC | PRN

## 2013-12-27 NOTE — Patient Instructions (Signed)
Achilles Tendinitis Achilles tendinitis is inflammation of the tough, cord-like band that attaches the lower muscles of your leg to your heel (Achilles tendon). It is usually caused by overusing the tendon and joint involved.  CAUSES Achilles tendinitis can happen because of:  A sudden increase in exercise or activity (such as running).  Doing the same exercises or activities (such as jumping) over and over.  Not warming up calf muscles before exercising.  Exercising in shoes that are worn out or not made for exercise.  Having arthritis or a bone growth on the back of the heel bone. This can rub against the tendon and hurt the tendon. SIGNS AND SYMPTOMS The most common symptoms are:  Pain in the back of the leg, just above the heel. The pain usually gets worse with exercise and better with rest.  Stiffness or soreness in the back of the leg, especially in the morning.  Swelling of the skin over the Achilles tendon.  Trouble standing on tiptoe. Sometimes, an Achilles tendon tears (ruptures). Symptoms of an Achilles tendon rupture can include:  Sudden, severe pain in the back of the leg.  Trouble putting weight on the foot or walking normally. DIAGNOSIS Achilles tendinitis will be diagnosed based on symptoms and a physical examination. An X-ray may be done to check if another condition is causing your symptoms. An MRI may be ordered if your health care provider suspects you may have completely torn your tendon, which is called an Achilles tendon rupture.  TREATMENT  Achilles tendinitis usually gets better over time. It can take weeks to months to heal completely. Treatment focuses on treating the symptoms and helping the injury heal. HOME CARE INSTRUCTIONS   Rest your Achilles tendon and avoid activities that cause pain.  Apply ice to the injured area:  Put ice in a plastic bag.  Place a towel between your skin and the bag.  Leave the ice on for 20 minutes, 2-3 times a  day  Try to avoid using the tendon (other than gentle range of motion) while the tendon is painful. Do not resume use until instructed by your health care provider. Then begin use gradually. Do not increase use to the point of pain. If pain does develop, decrease use and continue the above measures. Gradually increase activities that do not cause discomfort until you achieve normal use.  Do exercises to make your calf muscles stronger and more flexible. Your health care provider or physical therapist can recommend exercises for you to do.  Wrap your ankle with an elastic bandage or other wrap. This can help keep your tendon from moving too much. Your health care provider will show you how to wrap your ankle correctly.  Only take over-the-counter or prescription medicines for pain, discomfort, or fever as directed by your health care provider. SEEK MEDICAL CARE IF:   Your pain and swelling increase or pain is uncontrolled with medicines.  You develop new, unexplained symptoms or your symptoms get worse.  You are unable to move your toes or foot.  You develop warmth and swelling in your foot.  You have an unexplained temperature. MAKE SURE YOU:   Understand these instructions.  Will watch your condition.  Will get help right away if you are not doing well or get worse. Document Released: 01/22/2005 Document Revised: 02/02/2013 Document Reviewed: 11/24/2012 Exit[employer] Patient Information 2015 [employer]. This information is not intended to replace advice given to you by your health care provider. Make sure you discuss   any questions you have with your health care provider. Contact Dermatitis Contact dermatitis is a reaction to certain substances that touch the skin. Contact dermatitis can be either irritant contact dermatitis or allergic contact dermatitis. Irritant contact dermatitis does not require previous exposure to the substance for a reaction to occur.Allergic contact dermatitis  only occurs if you have been exposed to the substance before. Upon a repeat exposure, your body reacts to the substance.  CAUSES  Many substances can cause contact dermatitis. Irritant dermatitis is most commonly caused by repeated exposure to mildly irritating substances, such as:  Makeup.  Soaps.  Detergents.  Bleaches.  Acids.  Metal salts, such as nickel. Allergic contact dermatitis is most commonly caused by exposure to:  Poisonous plants.  Chemicals (deodorants, shampoos).  Jewelry.  Latex.  Neomycin in triple antibiotic cream.  Preservatives in products, including clothing. SYMPTOMS  The area of skin that is exposed may develop:  Dryness or flaking.  Redness.  Cracks.  Itching.  Pain or a burning sensation.  Blisters. With allergic contact dermatitis, there may also be swelling in areas such as the eyelids, mouth, or genitals.  DIAGNOSIS  Your caregiver can usually tell what the problem is by doing a physical exam. In cases where the cause is uncertain and an allergic contact dermatitis is suspected, a patch skin test may be performed to help determine the cause of your dermatitis. TREATMENT Treatment includes protecting the skin from further contact with the irritating substance by avoiding that substance if possible. Barrier creams, powders, and gloves may be helpful. Your caregiver may also recommend:  Steroid creams or ointments applied 2 times daily. For best results, soak the rash area in cool water for 20 minutes. Then apply the medicine. Cover the area with a plastic wrap. You can store the steroid cream in the refrigerator for a "chilly" effect on your rash. That may decrease itching. Oral steroid medicines may be needed in more severe cases.  Antibiotics or antibacterial ointments if a skin infection is present.  Antihistamine lotion or an antihistamine taken by mouth to ease itching.  Lubricants to keep moisture in your skin.  Burow's solution  to reduce redness and soreness or to dry a weeping rash. Mix one packet or tablet of solution in 2 cups cool water. Dip a clean washcloth in the mixture, wring it out a bit, and put it on the affected area. Leave the cloth in place for 30 minutes. Do this as often as possible throughout the day.  Taking several cornstarch or baking soda baths daily if the area is too large to cover with a washcloth. Harsh chemicals, such as alkalis or acids, can cause skin damage that is like a burn. You should flush your skin for 15 to 20 minutes with cold water after such an exposure. You should also seek immediate medical care after exposure. Bandages (dressings), antibiotics, and pain medicine may be needed for severely irritated skin.  HOME CARE INSTRUCTIONS  Avoid the substance that caused your reaction.  Keep the area of skin that is affected away from hot water, soap, sunlight, chemicals, acidic substances, or anything else that would irritate your skin.  Do not scratch the rash. Scratching may cause the rash to become infected.  You may take cool baths to help stop the itching.  Only take over-the-counter or prescription medicines as directed by your caregiver.  See your caregiver for follow-up care as directed to make sure your skin is healing properly. [place]  IF:   Your condition is not better after 3 days of treatment.  You seem to be getting worse.  You see signs of infection such as swelling, tenderness, redness, soreness, or warmth in the affected area.  You have any problems related to your medicines. Document Released: 04/11/2000 Document Revised: 07/07/2011 Document Reviewed: 09/17/2010 [facility] Patient Information 2015 [place], [place]. This information is not intended to replace advice given to you by your health care provider. Make sure you discuss any questions you have with your health care provider. Cellulitis Cellulitis is an infection of the skin and the tissue beneath  it. The infected area is usually red and tender. Cellulitis occurs most often in the arms and lower legs.  CAUSES  Cellulitis is caused by bacteria that enter the skin through cracks or cuts in the skin. The most common types of bacteria that cause cellulitis are staphylococci and streptococci. SIGNS AND SYMPTOMS   Redness and warmth.  Swelling.  Tenderness or pain.  Fever. DIAGNOSIS  Your health care provider can usually determine what is wrong based on a physical exam. Blood tests may also be done. TREATMENT  Treatment usually involves taking an antibiotic medicine. HOME CARE INSTRUCTIONS   Take your antibiotic medicine as directed by your health care provider. Finish the antibiotic even if you start to feel better.  Keep the infected arm or leg elevated to reduce swelling.  Apply a warm cloth to the affected area up to 4 times per day to relieve pain.  Take medicines only as directed by your health care provider.  Keep all follow-up visits as directed by your health care provider. SEEK MEDICAL CARE IF:   You notice red streaks coming from the infected area.  Your red area gets larger or turns dark in color.  Your bone or joint underneath the infected area becomes painful after the skin has healed.  Your infection returns in the same area or another area.  You notice a swollen bump in the infected area.  You develop new symptoms.  You have a fever. SEEK IMMEDIATE MEDICAL CARE IF:   You feel very sleepy.  You develop vomiting or diarrhea.  You have a general ill feeling (malaise) with muscle aches and pains. MAKE SURE YOU:   Understand these instructions.  Will watch your condition.  Will get help right away if you are not doing well or get worse. Document Released: 01/22/2005 Document Revised: 08/29/2013 Document Reviewed: 06/30/2011 [facility] Patient Information 2015 [place], [place]. This information is not intended to replace advice given to you by your  health care provider. Make sure you discuss any questions you have with your health care provider.

## 2013-12-27 NOTE — Progress Notes (Signed)
   Subjective:    [name], male    DOB: [birth year], 57 y.o.   MRN: [id number]  HPI    Review of Systems     Objective:   Physical Exam        Assessment & Plan:

## 2013-12-27 NOTE — Progress Notes (Signed)
Subjective:    [name], male    DOB: 05-Apr-1956, 58 y.o.   MRN: [id number]  HPI 58 year old male complains of rash on left arm. He has had the rash for about of week . He has tried calamine lotion and benadrly with no improvement. He also complains of knot in back of left foot. Not too painful unless it hits against something. Knot appeared after injury, still has full rom. Rash on left arm at antecubital fossa is itchy and painful, also itchy rash on neck. Started after working in yard. Has felt feverish  Review of Systems     Objective:   Physical Exam  Constitutional: He is oriented to person, place, and time. He appears well-nourished. No distress.  HENT:  Head: Normocephalic and atraumatic.  Eyes: EOM are normal. Pupils are equal, round, and reactive to light. No scleral icterus.  Neck: Normal range of motion.  Pulmonary/Chest: Effort normal.  Musculoskeletal:       Left foot: He exhibits tenderness, bony tenderness and swelling. He exhibits normal range of motion, normal capillary refill, no crepitus, no deformity and no laceration.       Feet:  Painful to toe walk  Neurological: He is alert and oriented to person, place, and time. He exhibits normal muscle tone. Coordination normal.  Skin: Rash noted. No abrasion, no ecchymosis, no lesion and no petechiae noted. Rash is maculopapular. Rash is not pustular. There is erythema.     Rash has become indurated and tender at elbow  Psychiatric: He has a normal mood and affect. His behavior is normal. Judgment and thought content normal.   [facility] reading (PRIMARY) by  Dr.Lanny Lipkin.large heel spurs, massive osteophytes  CBC normal Results for orders placed in visit on 12/27/13  POCT CBC      Result Value Ref Range   WBC 9.6  4.6 - 10.2 K/uL   Lymph, poc 2.5  0.6 - 3.4   POC LYMPH PERCENT 26.2  10 - 50 %L   MID (cbc) 1.1 (*) 0 - 0.9   POC MID % 11.2  0 - 12 %M   POC Granulocyte 6.0  2 - 6.9   Granulocyte  percent 62.6  37 - 80 %G   RBC 4.79  4.69 - 6.13 M/uL   Hemoglobin 14.7  14.1 - 18.1 g/dL   HCT, POC 36.6  44.0 - 53.7 %   MCV 94.1  80 - 97 fL   MCH, POC 30.7  27 - 31.2 pg   MCHC 32.7  31.8 - 35.4 g/dL   RDW, POC 34.7     Platelet Count, POC 262  142 - 424 K/uL   MPV 7.3  0 - 99.8 fL          Assessment & Plan:  Contact dermatitis with early cellulitis Clobetasol/doxycycline bid  Achilles strain with tendonitis and heel spurs Camwalker

## 2014-07-29 ENCOUNTER — Ambulatory Visit (INDEPENDENT_AMBULATORY_CARE_PROVIDER_SITE_OTHER): Payer: 59

## 2014-07-29 ENCOUNTER — Ambulatory Visit (INDEPENDENT_AMBULATORY_CARE_PROVIDER_SITE_OTHER): Payer: 59 | Admitting: Family Medicine

## 2014-07-29 VITALS — BP 140/74 | HR 66 | Temp 99.3°F | Resp 16 | Ht 70.0 in | Wt 291.0 lb

## 2014-07-29 DIAGNOSIS — J9801 Acute bronchospasm: Secondary | ICD-10-CM

## 2014-07-29 DIAGNOSIS — R509 Fever, unspecified: Secondary | ICD-10-CM

## 2014-07-29 DIAGNOSIS — J441 Chronic obstructive pulmonary disease with (acute) exacerbation: Secondary | ICD-10-CM

## 2014-07-29 LAB — POCT INFLUENZA A/B
Influenza A, POC: NEGATIVE
Influenza B, POC: NEGATIVE

## 2014-07-29 MED ORDER — ALBUTEROL SULFATE (2.5 MG/3ML) 0.083% IN NEBU
2.5000 mg | INHALATION_SOLUTION | Freq: Once | RESPIRATORY_TRACT | Status: AC
  Administered 2014-07-29: 2.5 mg via RESPIRATORY_TRACT

## 2014-07-29 MED ORDER — IPRATROPIUM BROMIDE 0.02 % IN SOLN
0.5000 mg | Freq: Once | RESPIRATORY_TRACT | Status: AC
  Administered 2014-07-29: 0.5 mg via RESPIRATORY_TRACT

## 2014-07-29 MED ORDER — DOXYCYCLINE HYCLATE 100 MG PO TABS: 100.0000 mg | ORAL_TABLET | Freq: Two times a day (BID) | ORAL | Status: DC

## 2014-07-29 MED ORDER — ALBUTEROL SULFATE HFA 108 (90 BASE) MCG/ACT IN AERS: 2.0000 | INHALATION_SPRAY | Freq: Four times a day (QID) | RESPIRATORY_TRACT | Status: DC | PRN

## 2014-07-29 MED ORDER — MOMETASONE FURO-FORMOTEROL FUM 100-5 MCG/ACT IN AERO: 2.0000 | INHALATION_SPRAY | Freq: Two times a day (BID) | RESPIRATORY_TRACT | Status: DC

## 2014-07-29 MED ORDER — PREDNISONE 20 MG PO TABS: ORAL_TABLET | ORAL | Status: DC

## 2014-07-29 NOTE — Progress Notes (Signed)
[facility] [address] [place] [address] [phone]  Date:  07/29/2014   Name:  Zachary Jacobs   DOB:  11-19-1955   MRN:  [id number]  PCP:  No PCP Per Patient    Chief Complaint: Wheezing and Cough   History of Present Illness:  Zachary Jacobs is a 59 y.o. very pleasant male patient who presents with the following:  Here today with illness.  He was at work about 4 days ago- he noted sudden on set of wheezing.  They gave him an albuterol and atrovent treatment and he felt better.  However since then he has noted peroeds of wheezing, bronchospasm. He has not felt achy or flu like at home. Did take ibuprofen last night  He has had some cough- mostly productive of thin, white mucus.  He is staying well hydrated.    He uses dulera but been out for about one month.  He does have COPD He is a former smoker- quit in 1985.      There are no active problems to display for this patient.   Past Medical History  Diagnosis Date  . Sleep apnea   . Obesity, Class III, BMI 40-49.9 (morbid obesity)   . COPD (chronic obstructive pulmonary disease)   . Kidney stones   . Sleep apnea   . Sleep apnea     Past Surgical History  Procedure Laterality Date  . Rhinoplasty      History  Substance Use Topics  . Smoking status: Former Smoker    Types: Cigarettes  . Smokeless tobacco: Not on file  . Alcohol Use: No    Family History  Problem Relation Age of Onset  . Cancer Other   . Obesity Other   . Cancer Mother   . Heart disease Father     No Known Allergies  Medication list has been reviewed and updated.  Current Outpatient Prescriptions on File Prior to Visit  Medication Sig Dispense Refill  . esomeprazole (NEXIUM) 40 MG capsule Take 1 capsule (40 mg total) by mouth daily before breakfast. 30 capsule 5  . ibuprofen (ADVIL,MOTRIN) 600 MG tablet Take 1 tablet (600 mg total) by mouth every 8 (eight) hours as needed. 30 tablet 0  . mometasone  (NASONEX) 50 MCG/ACT nasal spray Place 2 sprays into the nose daily. 17 g 1  . mometasone-formoterol (DULERA) 100-5 MCG/ACT AERO Inhale 2 puffs into the lungs 2 (two) times daily. 13 g 12   No current facility-administered medications on file prior to visit.    Review of Systems:  As per HPI- otherwise negative. Using some albuterol that he had at home- admits it is about 58 years old  Physical Examination: Filed Vitals:   07/29/14 1439  BP: 140/74  Pulse: 66  Temp: 99.3 F (37.4 C)  Resp: 16   Filed Vitals:   07/29/14 1439  Height: 5\' 10"  (1.778 m)  Weight: 291 lb (131.997 kg)   Body mass index is 41.75 kg/(m^2). Ideal Body Weight: Weight in (lb) to have BMI = 25: 173.9  GEN: WDWN, NAD, Non-toxic, A & O x 3, obese HEENT: Atraumatic, Normocephalic. Neck supple. No masses, No LAD. Bilateral TM wnl, oropharynx normal.  PEERL,EOMI.   Ears and Nose: No external deformity. CV: RRR, No M/G/R. No JVD. No thrill. No extra heart sounds. PULM:  No retractions. No resp. distress. No accessory muscle use.  Bilateral wheezes and decreased air movement EXTR: No c/c/e NEURO Normal  gait.  PSYCH: Normally interactive. Conversant. Not depressed or anxious appearing.  Calm demeanor.   Duoneb:improved his wheezing and air flow  [facility] reading (PRIMARY) by  Dr. Lorelei Pont. CXR: scarring lower lungs  Results for orders placed or performed in visit on 07/29/14  POCT Influenza A/B  Result Value Ref Range   Influenza A, POC Negative    Influenza B, POC Negative     Assessment and Plan: Bronchospasm - Plan: DG Chest 2 View, albuterol (PROVENTIL) (2.5 MG/3ML) 0.083% nebulizer solution 2.5 mg, ipratropium (ATROVENT) nebulizer solution 0.5 mg, albuterol (PROVENTIL HFA;VENTOLIN HFA) 108 (90 BASE) MCG/ACT inhaler  Low grade fever - Plan: DG Chest 2 View, POCT Influenza A/B  COPD exacerbation - Plan: mometasone-formoterol (DULERA) 100-5 MCG/ACT AERO, doxycycline (VIBRA-TABS) 100 MG tablet, predniSONE  (DELTASONE) 20 MG tablet  Treat for asthma/ COPD excerebration as above.  He will seek care if not better soon  Signed Lamar Blinks, MD

## 2014-07-29 NOTE — Patient Instructions (Signed)
Go back on your dulera Start doxycycline (abx) twice a day Prednisone for 6 days Albuterol as needed Let us know if you are not better soon!

## 2014-07-30 MED ORDER — MOMETASONE FURO-FORMOTEROL FUM 100-5 MCG/ACT IN AERO: 2.0000 | INHALATION_SPRAY | Freq: Two times a day (BID) | RESPIRATORY_TRACT | Status: DC

## 2014-07-30 NOTE — Addendum Note (Signed)
Addended by: Orion Crook on: 07/30/2014 11:52 AM   Modules accepted: Orders

## 2014-07-31 ENCOUNTER — Telehealth: Payer: Self-pay

## 2014-07-31 NOTE — Telephone Encounter (Signed)
PA for [facility] sent to [facility].

## 2014-08-09 ENCOUNTER — Telehealth: Payer: Self-pay | Admitting: Family Medicine

## 2014-08-09 MED ORDER — BUDESONIDE-FORMOTEROL FUMARATE 80-4.5 MCG/ACT IN AERO: 2.0000 | INHALATION_SPRAY | Freq: Two times a day (BID) | RESPIRATORY_TRACT | Status: DC

## 2014-08-09 NOTE — Telephone Encounter (Signed)
Spoke with pt, advised Symbicort is ok to take. He needs a Rx for Symbicort since his insurance will pay for this.

## 2014-08-09 NOTE — Telephone Encounter (Signed)
Meds ordered this encounter  Medications  . budesonide-formoterol (SYMBICORT) 80-4.5 MCG/ACT inhaler    Sig: Inhale 2 puffs into the lungs 2 (two) times daily.    Dispense:  1 Inhaler    Refill:  12    Order Specific Question:  Supervising Provider    Answer:  DOOLITTLE, ROBERT P [[id number]

## 2014-08-09 NOTE — Telephone Encounter (Signed)
Patient states that he is switching from [place] to [place] due to his insurance. Patient wants to know if this is a good choice in medication (he was given 4 to choose from and he picked Symbacort). Please advise.   [phone]

## 2014-08-09 NOTE — Telephone Encounter (Signed)
Pt.notified

## 2014-10-08 ENCOUNTER — Ambulatory Visit (INDEPENDENT_AMBULATORY_CARE_PROVIDER_SITE_OTHER): Payer: 59 | Admitting: Family Medicine

## 2014-10-08 VITALS — BP 140/82 | HR 75 | Temp 99.2°F | Resp 18 | Ht 70.0 in | Wt 292.4 lb

## 2014-10-08 DIAGNOSIS — L0201 Cutaneous abscess of face: Secondary | ICD-10-CM

## 2014-10-08 DIAGNOSIS — L03211 Cellulitis of face: Secondary | ICD-10-CM | POA: Diagnosis not present

## 2014-10-08 MED ORDER — AMOXICILLIN-POT CLAVULANATE 875-125 MG PO TABS: 1.0000 | ORAL_TABLET | Freq: Two times a day (BID) | ORAL | Status: DC

## 2014-10-08 NOTE — Progress Notes (Signed)
[name], male   DOB: 1955-10-15, 59 y.o.   MRN: [id number]   This chart was scribed for Robyn Haber, MD by [facility], medical scribe at [facility].The patient was seen in exam room 09 and the patient's care was started at 4:10 PM.  [name] MRN: [id number], DOB: [birth year], 59 y.o. Date of Encounter: 10/08/2014  Primary Physician: No PCP Per Patient  Chief Complaint:  Chief Complaint  Patient presents with   Facial Swelling    Right side jaw, inside mouth gum swelling   HPI:  Zachary Jacobs is a 59 y.o. male who presents to [facility] complaining of right sided facial swelling, onset two days ago. The area is very tender. He is working at the [employer] in [employer].  Past Medical History  Diagnosis Date   Sleep apnea    Obesity, Class III, BMI 40-49.9 (morbid obesity)    COPD (chronic obstructive pulmonary disease)    Kidney stones    Sleep apnea    Sleep apnea     Home Meds: Prior to Admission medications   Medication Sig Start Date End Date Taking? Authorizing Provider  albuterol (PROVENTIL HFA;VENTOLIN HFA) 108 (90 BASE) MCG/ACT inhaler Inhale 2 puffs into the lungs every 6 (six) hours as needed for wheezing or shortness of breath. 07/29/14  Yes Gay Filler Copland, MD  budesonide-formoterol (SYMBICORT) 80-4.5 MCG/ACT inhaler Inhale 2 puffs into the lungs 2 (two) times daily. 08/09/14  Yes Chelle Jeffery, PA-C  fluticasone (FLONASE) 50 MCG/ACT nasal spray Place 2 sprays into both nostrils daily.   Yes Historical Provider, MD  mometasone (NASONEX) 50 MCG/ACT nasal spray Place 2 sprays into the nose daily. 12/04/11  Yes Rosana Hoes, MD  doxycycline (VIBRA-TABS) 100 MG tablet Take 1 tablet (100 mg total) by mouth 2 (two) times daily. Patient not taking: Reported on 10/08/2014 07/29/14   Darreld Mclean, MD  esomeprazole (NEXIUM) 40 MG capsule Take 1 capsule (40 mg total) by mouth daily  before breakfast. Patient not taking: Reported on 10/08/2014 06/26/13   Roselee Culver, MD  ibuprofen (ADVIL,MOTRIN) 600 MG tablet Take 1 tablet (600 mg total) by mouth every 8 (eight) hours as needed. Patient not taking: Reported on 10/08/2014 12/27/13   Orma Flaming, MD  predniSONE (DELTASONE) 20 MG tablet Take 2 pills a day for 3 days, then 1 pill a day for 3 days Patient not taking: Reported on 10/08/2014 07/29/14   Darreld Mclean, MD   Allergies: No Known Allergies  History   Social History   Marital Status: Married    Spouse Name: N/A   Number of Children: N/A   Years of Education: N/A   Occupational History   Not on file.   Social History Main Topics   Smoking status: Former Smoker    Types: Cigarettes   Smokeless tobacco: Not on file   Alcohol Use: No   Drug Use: No   Sexual Activity: Yes   Other Topics Concern   Not on file   Social History Narrative   Review of Systems: Constitutional: negative for chills, fever, night sweats, weight changes, or fatigue  HEENT: negative for vision changes, hearing loss, congestion, rhinorrhea, ST, epistaxis, or sinus pressure. Positive for facial swelling. Cardiovascular: negative for chest pain or palpitations Respiratory: negative for hemoptysis, wheezing, shortness of breath, or cough Abdominal: negative for abdominal pain, nausea, vomiting, diarrhea, or constipation Dermatological:  negative for rash Neurologic: negative for headache, dizziness, or syncope All other systems reviewed and are otherwise negative with the exception to those above and in the HPI.  Physical Exam: Blood pressure 140/82, pulse 75, temperature 99.2 F (37.3 C), temperature source Oral, resp. rate 18, height 5\' 10"  (1.778 m), weight 292 lb 6.4 oz (132.632 kg), SpO2 97 %., Body mass index is 41.96 kg/(m^2). General: Well developed, well nourished, in no acute distress. Head: Normocephalic, atraumatic, eyes without discharge, sclera  non-icteric, nares are without discharge. Bilateral auditory canals clear, TM's are without perforation, pearly grey and translucent with reflective cone of light bilaterally. Oral cavity moist, posterior pharynx without exudate, erythema, peritonsillar abscess, or post nasal drip. 1 cm area of inudartion, erythema and tenderness. In the cheek of the right side just lateral to the 2 nd lower molar. Neck: Supple. No thyromegaly. Full ROM. No lymphadenopathy. Lungs: Clear bilaterally to auscultation without wheezes, rales, or rhonchi. Breathing is unlabored. Heart: RRR with S1 S2. No murmurs, rubs, or gallops appreciated. Abdomen: Soft, non-tender, non-distended with normoactive bowel sounds. No hepatomegaly. No rebound/guarding. No obvious abdominal masses. Msk:  Strength and tone normal for age. Extremities/Skin: Warm and dry. No clubbing or cyanosis. No edema. No rashes or suspicious lesions. Neuro: Alert and oriented X 3. Moves all extremities spontaneously. Gait is normal. CNII-XII grossly in tact. Psych:  Responds to questions appropriately with a normal affect.   Labs:  ASSESSMENT AND PLAN:  59 y.o. year old male with   This chart was scribed in my presence and reviewed by me personally.    ICD-9-CM ICD-10-CM   1. Cellulitis and abscess of face 682.0 L03.211 amoxicillin-clavulanate (AUGMENTIN) 875-125 MG per tablet    L02.01      Signed, Robyn Haber, MD   Signed, Robyn Haber, MD 10/08/2014 4:10 PM

## 2014-10-08 NOTE — Patient Instructions (Signed)

## 2015-03-02 ENCOUNTER — Ambulatory Visit (INDEPENDENT_AMBULATORY_CARE_PROVIDER_SITE_OTHER): Payer: 59 | Admitting: Internal Medicine

## 2015-03-02 VITALS — BP 114/66 | HR 81 | Temp 99.5°F | Resp 16 | Ht 69.0 in | Wt 286.6 lb

## 2015-03-02 DIAGNOSIS — N390 Urinary tract infection, site not specified: Secondary | ICD-10-CM

## 2015-03-02 DIAGNOSIS — R8281 Pyuria: Secondary | ICD-10-CM

## 2015-03-02 DIAGNOSIS — R509 Fever, unspecified: Secondary | ICD-10-CM | POA: Diagnosis not present

## 2015-03-02 DIAGNOSIS — R319 Hematuria, unspecified: Secondary | ICD-10-CM

## 2015-03-02 DIAGNOSIS — D72829 Elevated white blood cell count, unspecified: Secondary | ICD-10-CM

## 2015-03-02 DIAGNOSIS — R109 Unspecified abdominal pain: Secondary | ICD-10-CM

## 2015-03-02 LAB — POCT URINALYSIS DIP (MANUAL ENTRY)
Bilirubin, UA: NEGATIVE
GLUCOSE UA: NEGATIVE
Ketones, POC UA: NEGATIVE
Nitrite, UA: NEGATIVE
Spec Grav, UA: 1.025
UROBILINOGEN UA: 0.2
pH, UA: 6

## 2015-03-02 LAB — POC MICROSCOPIC URINALYSIS (UMFC)

## 2015-03-02 LAB — POCT CBC
Granulocyte percent: 83.5 %G — AB (ref 37–80)
HCT, POC: 42.6 % — AB (ref 43.5–53.7)
Hemoglobin: 14.8 g/dL (ref 14.1–18.1)
LYMPH, POC: 2.1 (ref 0.6–3.4)
MCH, POC: 31.7 pg — AB (ref 27–31.2)
MCHC: 34.8 g/dL (ref 31.8–35.4)
MCV: 91 fL (ref 80–97)
MID (cbc): 0.5 (ref 0–0.9)
MPV: 7.1 fL (ref 0–99.8)
POC GRANULOCYTE: 13.3 — AB (ref 2–6.9)
POC LYMPH %: 13.3 % (ref 10–50)
POC MID %: 3.2 % (ref 0–12)
Platelet Count, POC: 241 10*3/uL (ref 142–424)
RBC: 4.68 M/uL — AB (ref 4.69–6.13)
RDW, POC: 13.8 %
WBC: 15.9 10*3/uL — AB (ref 4.6–10.2)

## 2015-03-02 LAB — COMPREHENSIVE METABOLIC PANEL
ALK PHOS: 80 U/L (ref 40–115)
ALT: 16 U/L (ref 9–46)
AST: 17 U/L (ref 10–35)
Albumin: 3.8 g/dL (ref 3.6–5.1)
BUN: 15 mg/dL (ref 7–25)
CO2: 26 mmol/L (ref 20–31)
Calcium: 8.7 mg/dL (ref 8.6–10.3)
Chloride: 103 mmol/L (ref 98–110)
Creat: 1.71 mg/dL — ABNORMAL HIGH (ref 0.70–1.33)
Glucose, Bld: 99 mg/dL (ref 65–99)
POTASSIUM: 4.3 mmol/L (ref 3.5–5.3)
Sodium: 141 mmol/L (ref 135–146)
TOTAL PROTEIN: 7.6 g/dL (ref 6.1–8.1)
Total Bilirubin: 1.4 mg/dL — ABNORMAL HIGH (ref 0.2–1.2)

## 2015-03-02 MED ORDER — TAMSULOSIN HCL 0.4 MG PO CAPS: 0.4000 mg | ORAL_CAPSULE | Freq: Every day | ORAL | Status: DC

## 2015-03-02 MED ORDER — SULFAMETHOXAZOLE-TRIMETHOPRIM 800-160 MG PO TABS: 1.0000 | ORAL_TABLET | Freq: Two times a day (BID) | ORAL | Status: DC

## 2015-03-02 MED ORDER — CEFTRIAXONE SODIUM 1 G IJ SOLR
1.0000 g | Freq: Once | INTRAMUSCULAR | Status: AC
  Administered 2015-03-02: 1 g via INTRAMUSCULAR

## 2015-03-02 NOTE — Progress Notes (Signed)
Subjective:  This chart was scribed for Zachary Lin, MD by Thea Alken, ED Scribe. This patient was seen in room 12 and the patient's care was started at 8:43 AM.   [name], male    DOB: 1956/04/20, 59 y.o.   MRN: [id number]  HPI   Chief Complaint  Patient presents with  . Fever    102.5 on 03/01/15  . very mild lower left side pain    03/01/15  . dark cloudy urine    x 3 days   HPI Comments: AADYN BUCHHEIT is a 59 y.o. male who presents to the [facility] complaining of mild low side pain and fever. Pt states symptoms started yesterday with fever and arthralgias. He's also had mild congestion, loose stools and cloudy urine. Also mentions difficulty restarting urine stream once stopped.   He has hx of kidney stones. No hx of gout.  He denies cough,emesis, dysuria, and diarrhea.  There are no active problems to display for this patient. doesn't have routine care  Past Medical History  Diagnosis Date  . Sleep apnea   . Obesity, Class III, BMI 40-49.9 (morbid obesity) ([place])   . COPD (chronic obstructive pulmonary disease) ([place])   . Kidney stones   . Sleep apnea   . Sleep apnea    Prior to Admission medications   Medication Sig Start Date End Date Taking? Authorizing Provider  albuterol (PROVENTIL HFA;VENTOLIN HFA) 108 (90 BASE) MCG/ACT inhaler Inhale 2 puffs into the lungs every 6 (six) hours as needed for wheezing or shortness of breath. 07/29/14  Yes Gay Filler Copland, MD  budesonide-formoterol (SYMBICORT) 80-4.5 MCG/ACT inhaler Inhale 2 puffs into the lungs 2 (two) times daily. 08/09/14  Yes Chelle Jeffery, PA-C  fluticasone (FLONASE) 50 MCG/ACT nasal spray Place 2 sprays into both nostrils daily.   Yes Historical Provider, MD  ibuprofen (ADVIL,MOTRIN) 600 MG tablet Take 1 tablet (600 mg total) by mouth every 8 (eight) hours as needed. 12/27/13  Yes Orma Flaming, MD  mometasone (NASONEX) 50 MCG/ACT nasal spray Place 2 sprays into the  nose daily. 12/04/11  Yes Rosana Hoes, MD  esomeprazole (NEXIUM) 40 MG capsule Take 1 capsule (40 mg total) by mouth daily before breakfast. Patient not taking: Reported on 10/08/2014 06/26/13   Roselee Culver, MD    Review of Systems  Constitutional: Positive for fever and chills.  HENT: Positive for congestion.   Respiratory: Negative for cough.   Gastrointestinal: Negative for vomiting and diarrhea.  Genitourinary: Positive for flank pain. Negative for dysuria.  Musculoskeletal: Positive for myalgias and arthralgias.   Objective:   Physical Exam  Constitutional: He is oriented to person, place, and time. He appears well-developed and well-nourished. No distress.  Appears uncomfortable  HENT:  Head: Normocephalic and atraumatic.  Eyes: Conjunctivae and EOM are normal. Pupils are equal, round, and reactive to light.  Neck: Neck supple.  Cardiovascular: Normal rate, regular rhythm, normal heart sounds and intact distal pulses.   No murmur heard. Pulmonary/Chest: Effort normal and breath sounds normal.  Abdominal: Soft. Bowel sounds are normal. He exhibits no mass.  Obese abdomen that is non tender to palpation. Mildly tender in left flank.   Genitourinary:  Prostate small, soft and symmetrical  Musculoskeletal: Normal range of motion. He exhibits no edema.  Neurological: He is alert and oriented to person, place, and time. No cranial nerve deficit.  Skin: Skin is warm and dry.  Psychiatric: He has a  normal mood and affect. His behavior is normal.  Nursing note and vitals reviewed.  Filed Vitals:   03/02/15 0826  BP: 114/66  Pulse: 81  Temp: 99.5 F (37.5 C)  TempSrc: Oral  Resp: 16  Height: 5\' 9"  (1.753 m)  Weight: 286 lb 9.6 oz (130.001 kg)  SpO2: 98%   Results for orders placed or performed in visit on 03/02/15  POCT urinalysis dipstick  Result Value Ref Range   Color, UA orange (A) yellow   Clarity, UA cloudy (A) clear   Glucose, UA negative negative   Bilirubin,  UA negative negative   Ketones, POC UA negative negative   Spec Grav, UA 1.025    Blood, UA moderate (A) negative   pH, UA 6.0    Protein Ur, POC =100 (A) negative   Urobilinogen, UA 0.2    Nitrite, UA Negative Negative   Leukocytes, UA small (1+) (A) Negative  POCT Microscopic Urinalysis ([facility])  Result Value Ref Range   WBC,UR,HPF,POC Many (A) None WBC/hpf   RBC,UR,HPF,POC Many (A) None RBC/hpf   Bacteria Many (A) None, Too numerous to count   Mucus Present (A) Absent   Epithelial Cells, UR Per Microscopy None None, Too numerous to count cells/hpf  POCT CBC  Result Value Ref Range   WBC 15.9 (A) 4.6 - 10.2 K/uL   Lymph, poc 2.1 0.6 - 3.4   POC LYMPH PERCENT 13.3 10 - 50 %L   MID (cbc) 0.5 0 - 0.9   POC MID % 3.2 0 - 12 %M   POC Granulocyte 13.3 (A) 2 - 6.9   Granulocyte percent 83.5 (A) 37 - 80 %G   RBC 4.68 (A) 4.69 - 6.13 M/uL   Hemoglobin 14.8 14.1 - 18.1 g/dL   HCT, POC 42.6 (A) 43.5 - 53.7 %   MCV 91.0 80 - 97 fL   MCH, POC 31.7 (A) 27 - 31.2 pg   MCHC 34.8 31.8 - 35.4 g/dL   RDW, POC 13.8 %   Platelet Count, POC 241 142 - 424 K/uL   MPV 7.1 0 - 99.8 fL    Assessment & Plan:   1. Flank pain   2. Pyuria   3. Hematuria   4. Fever, unspecified fever cause    this process suggests early acute pyelonephritis  Orders Placed This Encounter  Procedures  . Urine culture  . PSA  . Comprehensive metabolic panel  . POCT urinalysis dipstick  . POCT Microscopic Urinalysis ([facility])  . POCT CBC    Meds ordered this encounter  Medications  . cefTRIAXone (ROCEPHIN) injection 1 g    Sig:     Order Specific Question:  Antibiotic Indication:    Answer:  UTI  . tamsulosin (FLOMAX) 0.4 MG CAPS capsule    Sig: Take 1 capsule (0.4 mg total) by mouth daily.    Dispense:  15 capsule    Refill:  0  . sulfamethoxazole-trimethoprim (BACTRIM DS,SEPTRA DS) 800-160 MG tablet    Sig: Take 1 tablet by mouth 2 (two) times daily.    Dispense:  20 tablet    Refill:  0  close  f/u  By signing my name below, I, Raven Small, attest that this documentation has been prepared under the direction and in the presence of Zachary Lin, MD.  Electronically Signed: Thea Alken, ED Scribe. 03/02/2015. 9:24 AM.  I have completed the patient encounter in its entirety as documented by the scribe, with editing by me where necessary. Robert P.  Laney Pastor, M.D.

## 2015-03-03 LAB — PSA: PSA: 0.34 ng/mL (ref <=4.00)

## 2015-03-04 LAB — URINE CULTURE: Colony Count: 50000

## 2015-03-07 ENCOUNTER — Other Ambulatory Visit: Payer: Self-pay | Admitting: Family Medicine

## 2015-03-07 ENCOUNTER — Ambulatory Visit (INDEPENDENT_AMBULATORY_CARE_PROVIDER_SITE_OTHER): Payer: 59 | Admitting: Emergency Medicine

## 2015-03-07 ENCOUNTER — Other Ambulatory Visit: Payer: Self-pay | Admitting: Urology

## 2015-03-07 ENCOUNTER — Other Ambulatory Visit: Payer: Self-pay | Admitting: Radiology

## 2015-03-07 ENCOUNTER — Other Ambulatory Visit: Payer: Self-pay | Admitting: Emergency Medicine

## 2015-03-07 ENCOUNTER — Ambulatory Visit
Admission: RE | Admit: 2015-03-07 | Discharge: 2015-03-07 | Disposition: A | Discharge status: Home / Self Care | Payer: 59 | Source: Ambulatory Visit | Attending: Emergency Medicine | Admitting: Emergency Medicine

## 2015-03-07 VITALS — BP 108/64 | HR 67 | Temp 98.3°F | Resp 16 | Ht 69.0 in | Wt 281.6 lb

## 2015-03-07 DIAGNOSIS — N1330 Unspecified hydronephrosis: Secondary | ICD-10-CM

## 2015-03-07 DIAGNOSIS — N10 Acute tubulo-interstitial nephritis: Secondary | ICD-10-CM | POA: Diagnosis not present

## 2015-03-07 DIAGNOSIS — R109 Unspecified abdominal pain: Secondary | ICD-10-CM

## 2015-03-07 DIAGNOSIS — N200 Calculus of kidney: Secondary | ICD-10-CM

## 2015-03-07 LAB — POCT CBC
Granulocyte percent: 82.6 %G — AB (ref 37–80)
HCT, POC: 42.5 % — AB (ref 43.5–53.7)
HEMOGLOBIN: 14.4 g/dL (ref 14.1–18.1)
LYMPH, POC: 2.5 (ref 0.6–3.4)
MCH, POC: 31.1 pg (ref 27–31.2)
MCHC: 33.9 g/dL (ref 31.8–35.4)
MCV: 91.9 fL (ref 80–97)
MID (CBC): 0.4 (ref 0–0.9)
MPV: 8.3 fL (ref 0–99.8)
POC Granulocyte: 14 — AB (ref 2–6.9)
POC LYMPH PERCENT: 14.9 %L (ref 10–50)
POC MID %: 2.5 % (ref 0–12)
Platelet Count, POC: 237 10*3/uL (ref 142–424)
RBC: 4.62 M/uL — AB (ref 4.69–6.13)
RDW, POC: 13.7 %
WBC: 16.9 10*3/uL — AB (ref 4.6–10.2)

## 2015-03-07 NOTE — Progress Notes (Signed)
Subjective:  [name], male    DOB: 1955/08/24  Age: 59 y.o. MRN: [id number]  CC: Fever; Chills; Anorexia; and Flank Pain   HPI Zachary Jacobs presents  with fever and chills. He was seen Friday for what was presumed to be a pyelonephritis and treated with Septra. He's continued to have fever chills and fatigue and poor appetite. He has no nausea vomiting or stool change. He said that his urine has in fact improved and that was very cloudy before and now is clear. He has no visible blood in his urine. He does have a history of kidney stones. He said on Friday the pain and flank felt like kidney stone but now it does not been taking his medication conscientiously and his had no meaningful improvement  History Zachary Jacobs has a past medical history of Sleep apnea; Obesity, Class III, BMI 40-49.9 (morbid obesity) ([place]); COPD (chronic obstructive pulmonary disease) ([place]); Kidney stones; Sleep apnea; and Sleep apnea.   He has past surgical history that includes Rhinoplasty.   His  family history includes Cancer in his mother and other; Heart disease in his father; Obesity in his other.  He   reports that he has quit smoking. His smoking use included Cigarettes. He does not have any smokeless tobacco history on file. He reports that he does not drink alcohol or use illicit drugs.  Outpatient Prescriptions Prior to Visit  Medication Sig Dispense Refill  . albuterol (PROVENTIL HFA;VENTOLIN HFA) 108 (90 BASE) MCG/ACT inhaler Inhale 2 puffs into the lungs every 6 (six) hours as needed for wheezing or shortness of breath. 1 Inhaler 6  . budesonide-formoterol (SYMBICORT) 80-4.5 MCG/ACT inhaler Inhale 2 puffs into the lungs 2 (two) times daily. 1 Inhaler 12  . fluticasone (FLONASE) 50 MCG/ACT nasal spray Place 2 sprays into both nostrils daily.    Marland Kitchen ibuprofen (ADVIL,MOTRIN) 600 MG tablet Take 1 tablet (600 mg total) by mouth every 8 (eight) hours as needed. 30 tablet 0  .  mometasone (NASONEX) 50 MCG/ACT nasal spray Place 2 sprays into the nose daily. 17 g 1  . sulfamethoxazole-trimethoprim (BACTRIM DS,SEPTRA DS) 800-160 MG tablet Take 1 tablet by mouth 2 (two) times daily. 20 tablet 0  . tamsulosin (FLOMAX) 0.4 MG CAPS capsule Take 1 capsule (0.4 mg total) by mouth daily. 15 capsule 0  . esomeprazole (NEXIUM) 40 MG capsule Take 1 capsule (40 mg total) by mouth daily before breakfast. (Patient not taking: Reported on 10/08/2014) 30 capsule 5   No facility-administered medications prior to visit.    Social History   Social History  . Marital Status: Married    Spouse Name: N/A  . Number of Children: N/A  . Years of Education: N/A   Social History Main Topics  . Smoking status: Former Smoker    Types: Cigarettes  . Smokeless tobacco: None  . Alcohol Use: No  . Drug Use: No  . Sexual Activity: Yes   Other Topics Concern  . None   Social History Narrative     Review of Systems  Constitutional: Positive for fever and chills. Negative for appetite change.  HENT: Negative for congestion, ear pain, postnasal drip, sinus pressure and sore throat.   Eyes: Negative for pain and redness.  Respiratory: Negative for cough, shortness of breath and wheezing.   Cardiovascular: Negative for leg swelling.  Gastrointestinal: Negative for nausea, vomiting, abdominal pain, diarrhea, constipation and blood in stool.  Endocrine: Negative for polyuria.  Genitourinary: Positive for  dysuria. Negative for urgency, frequency and flank pain.  Musculoskeletal: Negative for gait problem.  Skin: Negative for rash.  Neurological: Negative for weakness and headaches.  Psychiatric/Behavioral: Negative for confusion and decreased concentration. The patient is not nervous/anxious.     Objective:  BP 108/64 mmHg  Pulse 67  Temp(Src) 98.3 F (36.8 C) (Oral)  Resp 16  Ht 5\' 9"  (1.753 m)  Wt 281 lb 9.6 oz (127.733 kg)  BMI 41.57 kg/m2  SpO2 97%  Physical Exam    Constitutional: He is oriented to person, place, and time. He appears well-developed and well-nourished. No distress.  HENT:  Head: Normocephalic and atraumatic.  Right Ear: External ear normal.  Left Ear: External ear normal.  Nose: Nose normal.  Eyes: Conjunctivae and EOM are normal. Pupils are equal, round, and reactive to light. No scleral icterus.  Neck: Normal range of motion. Neck supple. No tracheal deviation present.  Cardiovascular: Normal rate, regular rhythm and normal heart sounds.   Pulmonary/Chest: Effort normal. No respiratory distress. He has no wheezes. He has no rales.  Abdominal: He exhibits no mass. There is no tenderness. There is no rebound and no guarding.  Musculoskeletal: He exhibits no edema.  Lymphadenopathy:    He has no cervical adenopathy.  Neurological: He is alert and oriented to person, place, and time. Coordination normal.  Skin: Skin is warm and dry. No rash noted.  Psychiatric: He has a normal mood and affect. His behavior is normal.      Assessment & Plan:   Zachary Jacobs was seen today for fever, chills, anorexia and flank pain.  Diagnoses and all orders for this visit:  Acute pyelonephritis -     POCT CBC -     Cancel: CT RENAL STONE STUDY; Future -     CT Abdomen Pelvis Wo Contrast; Future  Flank pain -     Cancel: CT RENAL STONE STUDY; Future -     CT Abdomen Pelvis Wo Contrast; Future  Renal stone   I have discontinued Zachary Jacobs's esomeprazole. I am also having him maintain his mometasone, ibuprofen, fluticasone, albuterol, budesonide-formoterol, tamsulosin, and sulfamethoxazole-trimethoprim.  No orders of the defined types were placed in this encounter.   His CT scan showed he had 2.3 cm stone in his right collecting system and proximal ureter area and he also has multiple other smaller stones. Given the elevation in his white count and an fail to improve I spoke with her urologist and he was advised to go to [facility]  directly from the CAT scan her for immediate follow-up  Appropriate red flag conditions were discussed with the patient as well as actions that should be taken.  Patient expressed his understanding.  Follow-up: Return if symptoms worsen or fail to improve.  Roselee Culver, MD   Results for orders placed or performed in visit on 03/07/15  POCT CBC  Result Value Ref Range   WBC 16.9 (A) 4.6 - 10.2 K/uL   Lymph, poc 2.5 0.6 - 3.4   POC LYMPH PERCENT 14.9 10 - 50 %L   MID (cbc) 0.4 0 - 0.9   POC MID % 2.5 0 - 12 %M   POC Granulocyte 14.0 (A) 2 - 6.9   Granulocyte percent 82.6 (A) 37 - 80 %G   RBC 4.62 (A) 4.69 - 6.13 M/uL   Hemoglobin 14.4 14.1 - 18.1 g/dL   HCT, POC 42.5 (A) 43.5 - 53.7 %   MCV 91.9 80 - 97 fL  MCH, POC 31.1 27 - 31.2 pg   MCHC 33.9 31.8 - 35.4 g/dL   RDW, POC 13.7 %   Platelet Count, POC 237 142 - 424 K/uL   MPV 8.3 0 - 99.8 fL

## 2015-03-07 NOTE — Patient Instructions (Signed)

## 2015-03-08 ENCOUNTER — Ambulatory Visit (HOSPITAL_COMMUNITY)
Admission: RE | Admit: 2015-03-08 | Discharge: 2015-03-08 | Disposition: A | Discharge status: Home / Self Care | Payer: 59 | Source: Ambulatory Visit | Attending: Interventional Radiology | Admitting: Interventional Radiology

## 2015-03-08 ENCOUNTER — Other Ambulatory Visit: Payer: Self-pay | Admitting: Urology

## 2015-03-08 ENCOUNTER — Ambulatory Visit (HOSPITAL_COMMUNITY)
Admission: RE | Admit: 2015-03-08 | Discharge: 2015-03-08 | Disposition: A | Discharge status: Home / Self Care | Payer: 59 | Source: Ambulatory Visit | Attending: Urology | Admitting: Urology

## 2015-03-08 ENCOUNTER — Encounter (HOSPITAL_COMMUNITY): Payer: Self-pay

## 2015-03-08 DIAGNOSIS — N132 Hydronephrosis with renal and ureteral calculous obstruction: Secondary | ICD-10-CM | POA: Diagnosis present

## 2015-03-08 DIAGNOSIS — Z6841 Body Mass Index (BMI) 40.0 and over, adult: Secondary | ICD-10-CM | POA: Insufficient documentation

## 2015-03-08 DIAGNOSIS — K760 Fatty (change of) liver, not elsewhere classified: Secondary | ICD-10-CM | POA: Diagnosis not present

## 2015-03-08 DIAGNOSIS — J449 Chronic obstructive pulmonary disease, unspecified: Secondary | ICD-10-CM | POA: Diagnosis not present

## 2015-03-08 DIAGNOSIS — N1330 Unspecified hydronephrosis: Secondary | ICD-10-CM

## 2015-03-08 DIAGNOSIS — Z87891 Personal history of nicotine dependence: Secondary | ICD-10-CM | POA: Diagnosis not present

## 2015-03-08 LAB — CBC WITH DIFFERENTIAL/PLATELET
Basophils Absolute: 0 10*3/uL (ref 0.0–0.1)
Basophils Relative: 0 %
EOS ABS: 0.1 10*3/uL (ref 0.0–0.7)
EOS PCT: 1 %
HCT: 38.9 % — ABNORMAL LOW (ref 39.0–52.0)
Hemoglobin: 13.1 g/dL (ref 13.0–17.0)
Lymphocytes Relative: 10 %
Lymphs Abs: 1.4 10*3/uL (ref 0.7–4.0)
MCH: 31.4 pg (ref 26.0–34.0)
MCHC: 33.7 g/dL (ref 30.0–36.0)
MCV: 93.3 fL (ref 78.0–100.0)
MONO ABS: 2.1 10*3/uL — AB (ref 0.1–1.0)
Monocytes Relative: 15 %
NEUTROS PCT: 74 %
Neutro Abs: 10.3 10*3/uL — ABNORMAL HIGH (ref 1.7–7.7)
PLATELETS: 319 10*3/uL (ref 150–400)
RBC: 4.17 MIL/uL — AB (ref 4.22–5.81)
RDW: 13.3 % (ref 11.5–15.5)
WBC: 13.9 10*3/uL — AB (ref 4.0–10.5)

## 2015-03-08 LAB — BASIC METABOLIC PANEL
Anion gap: 11 (ref 5–15)
BUN: 16 mg/dL (ref 6–20)
CALCIUM: 8.4 mg/dL — AB (ref 8.9–10.3)
CHLORIDE: 102 mmol/L (ref 101–111)
CO2: 21 mmol/L — ABNORMAL LOW (ref 22–32)
CREATININE: 1.64 mg/dL — AB (ref 0.61–1.24)
GFR calc non Af Amer: 45 mL/min — ABNORMAL LOW (ref >60)
GFR, EST AFRICAN AMERICAN: 52 mL/min — AB (ref >60)
Glucose, Bld: 114 mg/dL — ABNORMAL HIGH (ref 65–99)
Potassium: 3.9 mmol/L (ref 3.5–5.1)
SODIUM: 134 mmol/L — AB (ref 135–145)

## 2015-03-08 LAB — PROTIME-INR
INR: 1.18 (ref 0.00–1.49)
PROTHROMBIN TIME: 15.2 s (ref 11.6–15.2)

## 2015-03-08 MED ORDER — MEPERIDINE HCL 25 MG/ML IJ SOLN
INTRAMUSCULAR | Status: AC | PRN
  Administered 2015-03-08: 50 mg via INTRAVENOUS

## 2015-03-08 MED ORDER — MIDAZOLAM HCL 2 MG/2ML IJ SOLN
INTRAMUSCULAR | Status: AC
  Filled 2015-03-08: qty 6

## 2015-03-08 MED ORDER — CIPROFLOXACIN IN D5W 400 MG/200ML IV SOLN
INTRAVENOUS | Status: AC
  Administered 2015-03-08: 400 mg via INTRAVENOUS
  Filled 2015-03-08: qty 200

## 2015-03-08 MED ORDER — MEPERIDINE HCL 100 MG/ML IJ SOLN
INTRAMUSCULAR | Status: AC
  Filled 2015-03-08: qty 1

## 2015-03-08 MED ORDER — HYDROCODONE-ACETAMINOPHEN 5-325 MG PO TABS
1.0000 | ORAL_TABLET | ORAL | Status: DC | PRN
  Administered 2015-03-08: 1 via ORAL
  Filled 2015-03-08: qty 1

## 2015-03-08 MED ORDER — MIDAZOLAM HCL 2 MG/2ML IJ SOLN
INTRAMUSCULAR | Status: AC | PRN
  Administered 2015-03-08 (×5): 1 mg via INTRAVENOUS

## 2015-03-08 MED ORDER — FENTANYL CITRATE (PF) 100 MCG/2ML IJ SOLN
INTRAMUSCULAR | Status: AC | PRN
  Administered 2015-03-08: 25 ug via INTRAVENOUS
  Administered 2015-03-08: 50 ug via INTRAVENOUS

## 2015-03-08 MED ORDER — LIDOCAINE HCL 1 % IJ SOLN
INTRAMUSCULAR | Status: AC
  Filled 2015-03-08: qty 20

## 2015-03-08 MED ORDER — IOHEXOL 300 MG/ML  SOLN
20.0000 mL | Freq: Once | INTRAMUSCULAR | Status: DC | PRN
  Administered 2015-03-08: 20 mL
  Filled 2015-03-08: qty 20

## 2015-03-08 MED ORDER — CIPROFLOXACIN IN D5W 400 MG/200ML IV SOLN
400.0000 mg | Freq: Once | INTRAVENOUS | Status: AC
  Administered 2015-03-08: 400 mg via INTRAVENOUS

## 2015-03-08 MED ORDER — SODIUM CHLORIDE 0.9 % IV SOLN
INTRAVENOUS | Status: DC
  Administered 2015-03-08: 08:00:00 via INTRAVENOUS

## 2015-03-08 MED ORDER — FENTANYL CITRATE (PF) 100 MCG/2ML IJ SOLN
INTRAMUSCULAR | Status: AC
  Filled 2015-03-08: qty 4

## 2015-03-08 NOTE — H&P (Signed)
Chief Complaint: Patient was seen in consultation today for placement of left nephrostomy tube at the request of [place]  Referring Physician(s): MacDiarmid,Scott  History of Present Illness: Zachary Jacobs is a 59 y.o. male with left flank pain. He is found to have a large left renal stone causing obstructive uropathy and hydronephrosis. He was given a few days of po abx as he has had an elevated WBC and some chills. He was seen by urology and is referred for (L)PCN, with plans for eventual treatment of the stone. He feels ok, though he still reports some chills at least once a day. Has been NPO this am PMHx, meds, labs, imaging reviewed  Past Medical History  Diagnosis Date  . Sleep apnea   . Obesity, Class III, BMI 40-49.9 (morbid obesity) ([place])   . COPD (chronic obstructive pulmonary disease) ([place])   . Kidney stones   . Sleep apnea   . Sleep apnea     Past Surgical History  Procedure Laterality Date  . Rhinoplasty    . Cystoscopy w/ ureteral stent placement  2005    Allergies: Review of patient's allergies indicates no known allergies.  Medications: Prior to Admission medications   Medication Sig Start Date End Date Taking? Authorizing Provider  budesonide-formoterol (SYMBICORT) 80-4.5 MCG/ACT inhaler Inhale 2 puffs into the lungs 2 (two) times daily. 08/09/14  Yes Chelle Jeffery, PA-C  ibuprofen (ADVIL,MOTRIN) 600 MG tablet Take 1 tablet (600 mg total) by mouth every 8 (eight) hours as needed. 12/27/13  Yes Orma Flaming, MD  mometasone (NASONEX) 50 MCG/ACT nasal spray Place 2 sprays into the nose daily. 12/04/11  Yes Rosana Hoes, MD  sulfamethoxazole-trimethoprim (BACTRIM DS,SEPTRA DS) 800-160 MG tablet Take 1 tablet by mouth 2 (two) times daily. 03/02/15  Yes Leandrew Koyanagi, MD  tamsulosin (FLOMAX) 0.4 MG CAPS capsule Take 1 capsule (0.4 mg total) by mouth daily. 03/02/15  Yes Leandrew Koyanagi, MD  albuterol (PROVENTIL HFA;VENTOLIN HFA) 108 (90 BASE)  MCG/ACT inhaler Inhale 2 puffs into the lungs every 6 (six) hours as needed for wheezing or shortness of breath. 07/29/14   Gay Filler Copland, MD  fluticasone (FLONASE) 50 MCG/ACT nasal spray Place 2 sprays into both nostrils daily.    Historical Provider, MD     Family History  Problem Relation Age of Onset  . Cancer Other   . Obesity Other   . Cancer Mother   . Heart disease Father     Social History   Social History  . Marital Status: Married    Spouse Name: N/A  . Number of Children: N/A  . Years of Education: N/A   Social History Main Topics  . Smoking status: Former Smoker    Types: Cigarettes  . Smokeless tobacco: None  . Alcohol Use: No  . Drug Use: No  . Sexual Activity: Yes   Other Topics Concern  . None   Social History Narrative     Review of Systems: A 12 point ROS discussed and pertinent positives are indicated in the HPI above.  All other systems are negative.  Review of Systems  Vital Signs: BP 134/83 mmHg  Pulse 70  Temp(Src) 99 F (37.2 C) (Oral)  Resp 18  Ht 5\' 9"  (1.753 m)  Wt 281 lb 9 oz (127.716 kg)  BMI 41.56 kg/m2  SpO2 98%  Physical Exam  Constitutional: He is oriented to person, place, and time. He appears well-developed and well-nourished. No distress.  HENT:  Head:  Normocephalic.  Mouth/Throat: Oropharynx is clear and moist.  Neck: Normal range of motion. No tracheal deviation present.  Cardiovascular: Normal rate, regular rhythm and normal heart sounds.   Pulmonary/Chest: Breath sounds normal. No respiratory distress.  Abdominal: Soft. He exhibits no distension and no mass. There is no tenderness.  Neurological: He is alert and oriented to person, place, and time.  Skin: Skin is warm and dry. He is not diaphoretic.  Psychiatric: He has a normal mood and affect. Judgment normal.    Mallampati Score:  MD Evaluation Airway: WNL Heart: WNL Abdomen: WNL Chest/ Lungs: WNL ASA  Classification: 2 Mallampati/Airway Score:  Two  Imaging: Ct Abdomen Pelvis Wo Contrast  03/07/2015  CLINICAL DATA:  Patient with left lower quadrant pain for 1 week. Fever. EXAM: CT ABDOMEN AND PELVIS WITHOUT CONTRAST TECHNIQUE: Multidetector CT imaging of the abdomen and pelvis was performed following the standard protocol without IV contrast. COMPARISON:  CT abdomen pelvis 03/11/2011 FINDINGS: Lower chest: Normal heart size. No consolidative or nodular pulmonary opacities. No pleural effusion. Hepatobiliary: Liver is diffusely low in attenuation. Gallbladder is unremarkable. Unchanged prominent porta hepatic lymph node. Pancreas: Unremarkable Spleen: Unremarkable Adrenals/Urinary Tract: Normal adrenal glands. The left kidney is enlarged and there is fullness of the renal collecting system. Within the left renal collecting system/proximal ureter there is a 2.3 cm stone (image 33; series 3). Additionally there are multiple adjacent stones within the inferior aspect of the left renal collecting system measuring up to 1.4 cm. There is a small amount of left perinephric fat stranding. The ureters are normal in caliber. There is gas within the urinary bladder. There is a 2.4 cm cyst within the inferior pole of the right kidney. Stomach/Bowel: No abnormal bowel wall thickening or evidence for bowel obstruction. No free fluid or free intraperitoneal air. Vascular/Lymphatic: Normal caliber abdominal aorta. Multiple prominent left periaortic lymph nodes. Other: Central dystrophic calcifications in the prostate. Musculoskeletal: No aggressive or acute appearing osseous lesions. Small fat containing ventral abdominal wall hernia (image 59; series 3). Lumbar spine degenerative changes. IMPRESSION: There is a large stone within the left renal collecting system resulting in fullness of the left renal collecting system as well as enlargement of the left kidney and surrounding perinephric fat stranding. This stone likely results in a mild obstructive process.  Superimposed upstream infectious process is not excluded. Given overall appearance of the left kidney, consider dedicated evaluation with both pre and post contrast-enhanced imaging to exclude the possibility of underlying renal mass. Multiple prominent sub cm left para-aortic lymph nodes are nonspecific however likely reactive in etiology given the process involving the left kidney. Additional stones within the inferior pole of the left kidney. Small amount of gas within the urinary bladder, likely secondary to prior instrumentation. Hepatic steatosis. Electronically Signed   By: Lovey Newcomer M.D.   On: 03/07/2015 12:03    Labs:  CBC:  Recent Labs  03/02/15 0941 03/07/15 0941 03/08/15 0750  WBC 15.9* 16.9* 13.9*  HGB 14.8 14.4 13.1  HCT 42.6* 42.5* 38.9*  PLT  --   --  319    COAGS:  Recent Labs  03/08/15 0750  INR 1.18    BMP:  Recent Labs  03/02/15 0940 03/08/15 0750  NA 141 134*  K 4.3 3.9  CL 103 102  CO2 26 21*  GLUCOSE 99 114*  BUN 15 16  CALCIUM 8.7 8.4*  CREATININE 1.71* 1.64*  GFRNONAA  --  45*  GFRAA  --  52*  LIVER FUNCTION TESTS:  Recent Labs  03/02/15 0940  BILITOT 1.4*  AST 17  ALT 16  ALKPHOS 80  PROT 7.6  ALBUMIN 3.8    Assessment and Plan: (L)hydronephrosis from obstructing renal stone For (L)PCN Risks and Benefits discussed with the patient including, but not limited to infection, bleeding, significant bleeding causing loss or decrease in renal function or damage to adjacent structures.  Discussed possibility that procedure may induce fevers, chills, transient sepsis syndrome which may require admission for IV abx and observation. All of the patient's questions were answered, patient is agreeable to proceed. Consent signed and in chart.   Thank you for this interesting consult.  I greatly enjoyed meeting ERICSON EBRON and look forward to participating in their care.  A copy of this report was sent to the requesting provider on  this date.  SignedAscencion Dike 03/08/2015, 8:55 AM   I spent a total of 20 minutes in face to face in clinical consultation, greater than 50% of which was counseling/coordinating care for (L) percutaneous nephrostomy tube placement

## 2015-03-08 NOTE — Discharge Instructions (Signed)
Percutaneous Nephrostomy, Care After  Call [facility] [phone] for any questions or problems. Refer to this sheet in the next few weeks. These instructions provide you with information on caring for yourself after your procedure. Your health care provider may also give you more specific instructions. Your treatment has been planned according to current medical practices, but problems sometimes occur. Call your health care provider if you have any problems or questions after your procedure. WHAT TO EXPECT AFTER THE PROCEDURE You will need to remain lying down for several hours. HOME CARE INSTRUCTIONS  Your nephrostomy tube is connected to a leg bag or bedside drainage bag. Always keep the tubing, the leg bag, or the bedside drainage bags below the level of the kidney so that the urine drains freely.  During the day, if you are connecting the nephrostomy tube to a leg bag, be sure there are no kinks in the tubing and that the urine is draining freely.  At night, you may want to connect the nephrostomy tube or the leg bag to a larger bedside drainage bag.  Change the dressing as often as directed by your health care provider, or if it becomes wet.  Gently remove the tapes and dressing from around the nephrostomy tube. Be careful not to pull on the tube while removing the dressing.  Wash the skin around the tube, rinse well, and dry.  Place two split drain sponges in and around the tube exit site.  Place tape around edge of the dressing.  Secure the nephrostomy tubing. Remember to make certain that the nephrostomy tube does not kink or become pinched closed. It can be useful to wrap any exposed tubing going from the nephrostomy tube to any of the connecting tubes to either the leg bag or drainage bag with an elastic bandage.  Every three weeks, replace the leg bag, drainage bag, and any extension tubing connected to your nephrostomy tube. Your health care provider will explain how  to change the drainage bag and extension tubing. SEEK MEDICAL CARE IF:  You experience any problems with any of the valves or tubing.  You have persistent pain or soreness in your back.  You have a fever or chills. SEEK IMMEDIATE MEDICAL CARE IF:  You have abdominal pain during the first week.  You have a new appearance of blood in your urine.  You have back pain that is not relieved by your medicine.  You have drainage, redness, swelling, or pain at the tube insertion site.  You have decreased urine output.  Your nephrostomy tube comes out.   This information is not intended to replace advice given to you by your health care provider. Make sure you discuss any questions you have with your health care provider.   Document Released: 12/06/2003 Document Revised: 05/05/2014 Document Reviewed: 12/09/2012 Elsevier Interactive Patient Education 2016 Elsevier Inc.    Moderate Conscious Sedation, Adult Sedation is the use of medicines to promote relaxation and relieve discomfort and anxiety. Moderate conscious sedation is a type of sedation. Under moderate conscious sedation you are less alert than normal but are still able to respond to instructions or stimulation. Moderate conscious sedation is used during short medical and dental procedures. It is milder than deep sedation or general anesthesia and allows you to return to your regular activities sooner. LET [facility] CARE PROVIDER KNOW ABOUT:   Any allergies you have.  All medicines you are taking, including vitamins, herbs, eye drops, creams, and over-the-counter medicines.  Use  of steroids (by mouth or creams).  Previous problems you or members of your family have had with the use of anesthetics.  Any blood disorders you have.  Previous surgeries you have had.  Medical conditions you have.  Possibility of pregnancy, if this applies.  Use of cigarettes, alcohol, or illegal drugs. RISKS AND COMPLICATIONS Generally,  this is a safe procedure. However, as with any procedure, problems can occur. Possible problems include:  Oversedation.  Trouble breathing on your own. You may need to have a breathing tube until you are awake and breathing on your own.  Allergic reaction to any of the medicines used for the procedure. BEFORE THE PROCEDURE  You may have blood tests done. These tests can help show how well your kidneys and liver are working. They can also show how well your blood clots.  A physical exam will be done.  Only take medicines as directed by your health care provider. You may need to stop taking medicines (such as blood thinners, aspirin, or nonsteroidal anti-inflammatory drugs) before the procedure.   Do not eat or drink at least 6 hours before the procedure or as directed by your health care provider.  Arrange for a responsible adult, family member, or friend to take you home after the procedure. He or she should stay with you for at least 24 hours after the procedure, until the medicine has worn off. PROCEDURE   An intravenous (IV) catheter will be inserted into one of your veins. Medicine will be able to flow directly into your body through this catheter. You may be given medicine through this tube to help prevent pain and help you relax.  The medical or dental procedure will be done. AFTER THE PROCEDURE  You will stay in a recovery area until the medicine has worn off. Your blood pressure and pulse will be checked.   Depending on the procedure you had, you may be allowed to go home when you can tolerate liquids and your pain is under control.   This information is not intended to replace advice given to you by your health care provider. Make sure you discuss any questions you have with your health care provider.   Document Released: 01/07/2001 Document Revised: 05/05/2014 Document Reviewed: 12/20/2012 Elsevier Interactive Patient Education [employer].

## 2015-03-08 NOTE — Progress Notes (Signed)
Hep B titers have been low, so have been being go ing through series of vaccinations.

## 2015-03-08 NOTE — Progress Notes (Signed)
Ascencion Dike PA in to assess patient prior to discharge home. Prescription given to wife for pain meds. Patient had one vicodin here with relief prior to d/c home. All instructions reviewed regarding dressing care and flushing of tube. Wife demonstrated how to flush tube. Supplies given. VSS. MD phone number given to patient for any problems or concerns. D/c to car via wheelchair.

## 2015-03-08 NOTE — Procedures (Signed)
L perc nephrostomy placed under fluoro Purulent aspiration from collecting system, sample sent for GS, C&S No complication Min blood loss. See complete dictation in [facility].

## 2015-03-09 ENCOUNTER — Other Ambulatory Visit: Payer: Self-pay | Admitting: Urology

## 2015-03-11 LAB — URINE CULTURE

## 2015-03-13 ENCOUNTER — Other Ambulatory Visit: Payer: Self-pay | Admitting: Urology

## 2015-03-13 DIAGNOSIS — N1330 Unspecified hydronephrosis: Secondary | ICD-10-CM

## 2015-03-13 NOTE — Patient Instructions (Addendum)
Zachary Jacobs  03/13/2015   Your procedure is scheduled on: 03/20/2015    Report to [facility] Main  Entrance take [place]  elevators to 3rd floor to  [place] at    [place] AM.  Call this number if you have problems the morning of surgery [phone]   Remember: ONLY 1 PERSON MAY GO WITH YOU TO SHORT STAY TO GET  READY MORNING OF YOUR SURGERY.  Do not eat food or drink liquids :After Midnight.     Take these medicines the morning of surgery with A SIP OF WATER:   Albuterol Inhaler and bring, Symbicort Inhaler and bring, Flomax                                You may not have any metal on your body including hair pins and              piercings  Do not wear jewelry, lotions, powders or perfumes, deodorant                      Men may shave face and neck.   Do not bring valuables to the hospital. [place].  Contacts, dentures or bridgework may not be worn into surgery.  Leave suitcase in the car. After surgery it may be brought to your room.         Special Instructions: coughing and deep breathing exercises, leg exercises               Please read over the following fact sheets you were given: _____________________________________________________________________             [facility] - Preparing for Surgery Before surgery, you can play an important role.  Because skin is not sterile, your skin needs to be as free of germs as possible.  You can reduce the number of germs on your skin by washing with CHG (chlorahexidine gluconate) soap before surgery.  CHG is an antiseptic cleaner which kills germs and bonds with the skin to continue killing germs even after washing. Please DO NOT use if you have an allergy to CHG or antibacterial soaps.  If your skin becomes reddened/irritated stop using the CHG and inform your nurse when you arrive at Short Stay. Do not shave (including legs and underarms) for  at least 48 hours prior to the first CHG shower.  You may shave your face/neck. Please follow these instructions carefully:  1.  Shower with CHG Soap the night before surgery and the  morning of Surgery.  2.  If you choose to wash your hair, wash your hair first as usual with your  normal  shampoo.  3.  After you shampoo, rinse your hair and body thoroughly to remove the  shampoo.                           4.  Use CHG as you would any other liquid soap.  You can apply chg directly  to the skin and wash                       Gently with a scrungie or  clean washcloth.  5.  Apply the CHG Soap to your body ONLY FROM THE NECK DOWN.   Do not use on face/ open                           Wound or open sores. Avoid contact with eyes, ears mouth and genitals (private parts).                       Wash face,  Genitals (private parts) with your normal soap.             6.  Wash thoroughly, paying special attention to the area where your surgery  will be performed.  7.  Thoroughly rinse your body with warm water from the neck down.  8.  DO NOT shower/wash with your normal soap after using and rinsing off  the CHG Soap.                9.  Pat yourself dry with a clean towel.            10.  Wear clean pajamas.            11.  Place clean sheets on your bed the night of your first shower and do not  sleep with pets. Day of Surgery : Do not apply any lotions/deodorants the morning of surgery.  Please wear clean clothes to the hospital/surgery center.  FAILURE TO FOLLOW THESE INSTRUCTIONS MAY RESULT IN THE CANCELLATION OF YOUR SURGERY PATIENT SIGNATURE_________________________________  NURSE SIGNATURE__________________________________  ________________________________________________________________________  WHAT IS A BLOOD TRANSFUSION? Blood Transfusion Information  A transfusion is the replacement of blood or some of its parts. Blood is made up of multiple cells which provide different functions.  Red  blood cells carry oxygen and are used for blood loss replacement.  White blood cells fight against infection.  Platelets control bleeding.  Plasma helps clot blood.  Other blood products are available for specialized needs, such as hemophilia or other clotting disorders. BEFORE THE TRANSFUSION  Who gives blood for transfusions?   Healthy volunteers who are fully evaluated to make sure their blood is safe. This is blood bank blood. Transfusion therapy is the safest it has ever been in the practice of medicine. Before blood is taken from a donor, a complete history is taken to make sure that person has no history of diseases nor engages in risky social behavior (examples are intravenous drug use or sexual activity with multiple partners). The donor's travel history is screened to minimize risk of transmitting infections, such as malaria. The donated blood is tested for signs of infectious diseases, such as HIV and hepatitis. The blood is then tested to be sure it is compatible with you in order to minimize the chance of a transfusion reaction. If you or a relative donates blood, this is often done in anticipation of surgery and is not appropriate for emergency situations. It takes many days to process the donated blood. RISKS AND COMPLICATIONS Although transfusion therapy is very safe and saves many lives, the main dangers of transfusion include:  1. Getting an infectious disease. 2. Developing a transfusion reaction. This is an allergic reaction to something in the blood you were given. Every precaution is taken to prevent this. The decision to have a blood transfusion has been considered carefully by your caregiver before blood is given. Blood is not given unless the benefits outweigh the risks. AFTER THE TRANSFUSION  Right after receiving a blood transfusion, you will usually feel much better and more energetic. This is especially true if your red blood cells have gotten low (anemic). The  transfusion raises the level of the red blood cells which carry oxygen, and this usually causes an energy increase.  The nurse administering the transfusion will monitor you carefully for complications. HOME CARE INSTRUCTIONS  No special instructions are needed after a transfusion. You may find your energy is better. Speak with your caregiver about any limitations on activity for underlying diseases you may have. SEEK MEDICAL CARE IF:   Your condition is not improving after your transfusion.  You develop redness or irritation at the intravenous (IV) site. SEEK IMMEDIATE MEDICAL CARE IF:  Any of the following symptoms occur over the next 12 hours:  Shaking chills.  You have a temperature by mouth above 102 F (38.9 C), not controlled by medicine.  Chest, back, or muscle pain.  People around you feel you are not acting correctly or are confused.  Shortness of breath or difficulty breathing.  Dizziness and fainting.  You get a rash or develop hives.  You have a decrease in urine output.  Your urine turns a dark color or changes to pink, red, or brown. Any of the following symptoms occur over the next 10 days:  You have a temperature by mouth above 102 F (38.9 C), not controlled by medicine.  Shortness of breath.  Weakness after normal activity.  The white part of the eye turns yellow (jaundice).  You have a decrease in the amount of urine or are urinating less often.  Your urine turns a dark color or changes to pink, red, or brown. Document Released: 04/11/2000 Document Revised: 07/07/2011 Document Reviewed: 11/29/2007 ExitCare Patient Information 2014 [place].  _______________________________________________________________________  Incentive Spirometer  An incentive spirometer is a tool that can help keep your lungs clear and active. This tool measures how well you are filling your lungs with each breath. Taking long deep breaths may help reverse or  decrease the chance of developing breathing (pulmonary) problems (especially infection) following:  A long period of time when you are unable to move or be active. BEFORE THE PROCEDURE   If the spirometer includes an indicator to show your best effort, your nurse or respiratory therapist will set it to a desired goal.  If possible, sit up straight or lean slightly forward. Try not to slouch.  Hold the incentive spirometer in an upright position. INSTRUCTIONS FOR USE  3. Sit on the edge of your bed if possible, or sit up as far as you can in bed or on a chair. 4. Hold the incentive spirometer in an upright position. 5. Breathe out normally. 6. Place the mouthpiece in your mouth and seal your lips tightly around it. 7. Breathe in slowly and as deeply as possible, raising the piston or the ball toward the top of the column. 8. Hold your breath for 3-5 seconds or for as long as possible. Allow the piston or ball to fall to the bottom of the column. 9. Remove the mouthpiece from your mouth and breathe out normally. 10. Rest for a few seconds and repeat Steps 1 through 7 at least 10 times every 1-2 hours when you are awake. Take your time and take a few normal breaths between deep breaths. 11. The spirometer may include an indicator to show your best effort. Use the indicator as a goal to work toward during each repetition. 12. After  each set of 10 deep breaths, practice coughing to be sure your lungs are clear. If you have an incision (the cut made at the time of surgery), support your incision when coughing by placing a pillow or rolled up towels firmly against it. Once you are able to get out of bed, walk around indoors and cough well. You may stop using the incentive spirometer when instructed by your caregiver.  RISKS AND COMPLICATIONS  Take your time so you do not get dizzy or light-headed.  If you are in pain, you may need to take or ask for pain medication before doing incentive  spirometry. It is harder to take a deep breath if you are having pain. AFTER USE  Rest and breathe slowly and easily.  It can be helpful to keep track of a log of your progress. Your caregiver can provide you with a simple table to help with this. If you are using the spirometer at home, follow these instructions: [place] IF:   You are having difficultly using the spirometer.  You have trouble using the spirometer as often as instructed.  Your pain medication is not giving enough relief while using the spirometer.  You develop fever of 100.5 F (38.1 C) or higher. SEEK IMMEDIATE MEDICAL CARE IF:   You cough up bloody sputum that had not been present before.  You develop fever of 102 F (38.9 C) or greater.  You develop worsening pain at or near the incision site. MAKE SURE YOU:   Understand these instructions.  Will watch your condition.  Will get help right away if you are not doing well or get worse. Document Released: 08/25/2006 Document Revised: 07/07/2011 Document Reviewed: 10/26/2006 [facility] Patient Information 2014 [place], [place].   ________________________________________________________________________

## 2015-03-15 ENCOUNTER — Encounter (HOSPITAL_COMMUNITY)
Admission: RE | Admit: 2015-03-15 | Discharge: 2015-03-15 | Disposition: A | Discharge status: Home / Self Care | Payer: 59 | Source: Ambulatory Visit | Attending: Urology | Admitting: Urology

## 2015-03-15 ENCOUNTER — Encounter (HOSPITAL_COMMUNITY): Payer: Self-pay

## 2015-03-15 DIAGNOSIS — Z01812 Encounter for preprocedural laboratory examination: Secondary | ICD-10-CM | POA: Diagnosis present

## 2015-03-15 HISTORY — DX: Pneumonia, unspecified organism: J18.9

## 2015-03-15 LAB — CBC
HCT: 42.6 % (ref 39.0–52.0)
Hemoglobin: 13.9 g/dL (ref 13.0–17.0)
MCH: 31 pg (ref 26.0–34.0)
MCHC: 32.6 g/dL (ref 30.0–36.0)
MCV: 94.9 fL (ref 78.0–100.0)
PLATELETS: 450 10*3/uL — AB (ref 150–400)
RBC: 4.49 MIL/uL (ref 4.22–5.81)
RDW: 13.3 % (ref 11.5–15.5)
WBC: 11.6 10*3/uL — AB (ref 4.0–10.5)

## 2015-03-15 LAB — COMPREHENSIVE METABOLIC PANEL
ALBUMIN: 3.2 g/dL — AB (ref 3.5–5.0)
ALT: 27 U/L (ref 17–63)
AST: 26 U/L (ref 15–41)
Alkaline Phosphatase: 115 U/L (ref 38–126)
Anion gap: 9 (ref 5–15)
BUN: 14 mg/dL (ref 6–20)
CHLORIDE: 103 mmol/L (ref 101–111)
CO2: 25 mmol/L (ref 22–32)
CREATININE: 1.06 mg/dL (ref 0.61–1.24)
Calcium: 8.7 mg/dL — ABNORMAL LOW (ref 8.9–10.3)
GFR calc non Af Amer: >60 mL/min (ref >60)
Glucose, Bld: 94 mg/dL (ref 65–99)
Potassium: 4.2 mmol/L (ref 3.5–5.1)
SODIUM: 137 mmol/L (ref 135–145)
Total Bilirubin: 0.4 mg/dL (ref 0.3–1.2)
Total Protein: 8.6 g/dL — ABNORMAL HIGH (ref 6.5–8.1)

## 2015-03-19 MED ORDER — CEFAZOLIN SODIUM 10 G IJ SOLR
3.0000 g | INTRAMUSCULAR | Status: AC
  Administered 2015-03-20: 3 g via INTRAVENOUS
  Filled 2015-03-19 (×2): qty 3000

## 2015-03-20 ENCOUNTER — Ambulatory Visit (HOSPITAL_COMMUNITY)
Admission: RE | Admit: 2015-03-20 | Discharge: 2015-03-21 | Disposition: A | Discharge status: Home / Self Care | Payer: 59 | Source: Ambulatory Visit | Attending: Urology | Admitting: Urology

## 2015-03-20 ENCOUNTER — Ambulatory Visit (HOSPITAL_COMMUNITY): Payer: 59

## 2015-03-20 ENCOUNTER — Ambulatory Visit (HOSPITAL_COMMUNITY): Payer: 59 | Admitting: Certified Registered"

## 2015-03-20 ENCOUNTER — Encounter (HOSPITAL_COMMUNITY)
Admission: RE | Disposition: A | Discharge status: Home / Self Care | Payer: Self-pay | Source: Ambulatory Visit | Attending: Urology

## 2015-03-20 ENCOUNTER — Encounter (HOSPITAL_COMMUNITY): Payer: Self-pay

## 2015-03-20 DIAGNOSIS — J449 Chronic obstructive pulmonary disease, unspecified: Secondary | ICD-10-CM | POA: Insufficient documentation

## 2015-03-20 DIAGNOSIS — G4730 Sleep apnea, unspecified: Secondary | ICD-10-CM | POA: Insufficient documentation

## 2015-03-20 DIAGNOSIS — N200 Calculus of kidney: Secondary | ICD-10-CM | POA: Diagnosis present

## 2015-03-20 DIAGNOSIS — Z87442 Personal history of urinary calculi: Secondary | ICD-10-CM | POA: Insufficient documentation

## 2015-03-20 DIAGNOSIS — N281 Cyst of kidney, acquired: Secondary | ICD-10-CM | POA: Insufficient documentation

## 2015-03-20 DIAGNOSIS — K219 Gastro-esophageal reflux disease without esophagitis: Secondary | ICD-10-CM | POA: Insufficient documentation

## 2015-03-20 DIAGNOSIS — E039 Hypothyroidism, unspecified: Secondary | ICD-10-CM | POA: Diagnosis not present

## 2015-03-20 DIAGNOSIS — Z87891 Personal history of nicotine dependence: Secondary | ICD-10-CM | POA: Insufficient documentation

## 2015-03-20 HISTORY — PX: NEPHROLITHOTOMY: SHX5134

## 2015-03-20 SURGERY — NEPHROLITHOTOMY PERCUTANEOUS
Anesthesia: General | Site: Flank | Laterality: Left

## 2015-03-20 MED ORDER — ONDANSETRON HCL 4 MG/2ML IJ SOLN
INTRAMUSCULAR | Status: AC
  Filled 2015-03-20: qty 2

## 2015-03-20 MED ORDER — ONDANSETRON HCL 4 MG/2ML IJ SOLN: 4.0000 mg | Freq: Once | INTRAMUSCULAR | Status: DC | PRN

## 2015-03-20 MED ORDER — BACITRACIN ZINC 500 UNIT/GM EX OINT
TOPICAL_OINTMENT | CUTANEOUS | Status: AC
  Filled 2015-03-20: qty 28.35

## 2015-03-20 MED ORDER — LIDOCAINE HCL (CARDIAC) 20 MG/ML IV SOLN
INTRAVENOUS | Status: AC
  Filled 2015-03-20: qty 5

## 2015-03-20 MED ORDER — LIDOCAINE HCL (CARDIAC) 20 MG/ML IV SOLN
INTRAVENOUS | Status: DC | PRN
  Administered 2015-03-20: 50 mg via INTRAVENOUS

## 2015-03-20 MED ORDER — LACTATED RINGERS IV SOLN: INTRAVENOUS | Status: DC

## 2015-03-20 MED ORDER — ZOLPIDEM TARTRATE 5 MG PO TABS: 5.0000 mg | ORAL_TABLET | Freq: Every evening | ORAL | Status: DC | PRN

## 2015-03-20 MED ORDER — ROCURONIUM BROMIDE 100 MG/10ML IV SOLN
INTRAVENOUS | Status: AC
  Filled 2015-03-20: qty 1

## 2015-03-20 MED ORDER — BACITRACIN ZINC 500 UNIT/GM EX OINT
TOPICAL_OINTMENT | CUTANEOUS | Status: DC | PRN
  Administered 2015-03-20: 1 via TOPICAL

## 2015-03-20 MED ORDER — HYDROMORPHONE HCL 1 MG/ML IJ SOLN: 0.2500 mg | INTRAMUSCULAR | Status: DC | PRN

## 2015-03-20 MED ORDER — BELLADONNA ALKALOIDS-OPIUM 16.2-60 MG RE SUPP: 1.0000 | Freq: Four times a day (QID) | RECTAL | Status: DC | PRN

## 2015-03-20 MED ORDER — CEFAZOLIN SODIUM-DEXTROSE 2-3 GM-% IV SOLR
2.0000 g | Freq: Three times a day (TID) | INTRAVENOUS | Status: AC
  Administered 2015-03-20 – 2015-03-21 (×2): 2 g via INTRAVENOUS
  Filled 2015-03-20 (×3): qty 50

## 2015-03-20 MED ORDER — FENTANYL CITRATE (PF) 250 MCG/5ML IJ SOLN
INTRAMUSCULAR | Status: AC
  Filled 2015-03-20: qty 5

## 2015-03-20 MED ORDER — DEXAMETHASONE SODIUM PHOSPHATE 10 MG/ML IJ SOLN
INTRAMUSCULAR | Status: DC | PRN
  Administered 2015-03-20: 10 mg via INTRAVENOUS

## 2015-03-20 MED ORDER — MIDAZOLAM HCL 5 MG/5ML IJ SOLN
INTRAMUSCULAR | Status: DC | PRN
  Administered 2015-03-20: 2 mg via INTRAVENOUS

## 2015-03-20 MED ORDER — SUGAMMADEX SODIUM 500 MG/5ML IV SOLN
INTRAVENOUS | Status: AC
  Filled 2015-03-20: qty 5

## 2015-03-20 MED ORDER — DEXAMETHASONE SODIUM PHOSPHATE 10 MG/ML IJ SOLN
INTRAMUSCULAR | Status: AC
  Filled 2015-03-20: qty 1

## 2015-03-20 MED ORDER — LACTATED RINGERS IV SOLN
INTRAVENOUS | Status: DC | PRN
  Administered 2015-03-20 (×2): via INTRAVENOUS

## 2015-03-20 MED ORDER — HYDROMORPHONE HCL 2 MG/ML IJ SOLN
INTRAMUSCULAR | Status: AC
  Filled 2015-03-20: qty 1

## 2015-03-20 MED ORDER — FENTANYL CITRATE (PF) 100 MCG/2ML IJ SOLN
INTRAMUSCULAR | Status: DC | PRN
  Administered 2015-03-20: 50 ug via INTRAVENOUS
  Administered 2015-03-20: 100 ug via INTRAVENOUS
  Administered 2015-03-20 (×2): 50 ug via INTRAVENOUS

## 2015-03-20 MED ORDER — HYDROMORPHONE HCL 1 MG/ML IJ SOLN: 0.5000 mg | INTRAMUSCULAR | Status: DC | PRN

## 2015-03-20 MED ORDER — SODIUM CHLORIDE 0.9 % IR SOLN
Status: DC | PRN
  Administered 2015-03-20: 18000 mL

## 2015-03-20 MED ORDER — MEPERIDINE HCL 50 MG/ML IJ SOLN: 6.2500 mg | INTRAMUSCULAR | Status: DC | PRN

## 2015-03-20 MED ORDER — HYDROMORPHONE HCL 1 MG/ML IJ SOLN
INTRAMUSCULAR | Status: DC | PRN
  Administered 2015-03-20 (×2): 1 mg via INTRAVENOUS

## 2015-03-20 MED ORDER — OXYCODONE-ACETAMINOPHEN 5-325 MG PO TABS
1.0000 | ORAL_TABLET | ORAL | Status: DC | PRN
  Administered 2015-03-20: 1 via ORAL
  Filled 2015-03-20: qty 1

## 2015-03-20 MED ORDER — IOHEXOL 300 MG/ML  SOLN
INTRAMUSCULAR | Status: DC | PRN
  Administered 2015-03-20: 25 mL via URETHRAL

## 2015-03-20 MED ORDER — SODIUM CHLORIDE 0.9 % IV SOLN
INTRAVENOUS | Status: DC
  Administered 2015-03-20 (×2): via INTRAVENOUS

## 2015-03-20 MED ORDER — MIDAZOLAM HCL 2 MG/2ML IJ SOLN
INTRAMUSCULAR | Status: AC
  Filled 2015-03-20: qty 2

## 2015-03-20 MED ORDER — ACETAMINOPHEN 325 MG PO TABS: 650.0000 mg | ORAL_TABLET | ORAL | Status: DC | PRN

## 2015-03-20 MED ORDER — PROPOFOL 10 MG/ML IV BOLUS
INTRAVENOUS | Status: AC
  Filled 2015-03-20: qty 20

## 2015-03-20 MED ORDER — ROCURONIUM BROMIDE 100 MG/10ML IV SOLN
INTRAVENOUS | Status: DC | PRN
  Administered 2015-03-20 (×2): 10 mg via INTRAVENOUS
  Administered 2015-03-20: 50 mg via INTRAVENOUS

## 2015-03-20 MED ORDER — ONDANSETRON HCL 4 MG/2ML IJ SOLN: 4.0000 mg | INTRAMUSCULAR | Status: DC | PRN

## 2015-03-20 MED ORDER — PROPOFOL 10 MG/ML IV BOLUS
INTRAVENOUS | Status: DC | PRN
  Administered 2015-03-20: 200 mg via INTRAVENOUS

## 2015-03-20 MED ORDER — SUGAMMADEX SODIUM 500 MG/5ML IV SOLN
INTRAVENOUS | Status: DC | PRN
Start: 2015-03-20 — End: 2015-03-20
  Administered 2015-03-20: 250 mg via INTRAVENOUS

## 2015-03-20 SURGICAL SUPPLY — 56 items
APL ESCP 34 STRL LF DISP (HEMOSTASIS) ×2
APL SKNCLS STERI-STRIP NONHPOA (GAUZE/BANDAGES/DRESSINGS) ×2
APPLICATOR SURGIFLO ENDO (HEMOSTASIS) ×2 IMPLANT
BAG URINE DRAINAGE (UROLOGICAL SUPPLIES) IMPLANT
BASKET ZERO TIP NITINOL 2.4FR (BASKET) IMPLANT
BENZOIN TINCTURE PRP APPL 2/3 (GAUZE/BANDAGES/DRESSINGS) ×3 IMPLANT
BLADE SURG 15 STRL LF DISP TIS (BLADE) ×2 IMPLANT
BLADE SURG 15 STRL SS (BLADE) ×3
BSKT STON RTRVL ZERO TP 2.4FR (BASKET)
CATCHER STONE W/TUBE ADAPTER (UROLOGICAL SUPPLIES) IMPLANT
CATH FOLEY 2W COUNCIL 20FR 5CC (CATHETERS) IMPLANT
CATH FOLEY 2WAY SLVR  5CC 18FR (CATHETERS)
CATH FOLEY 2WAY SLVR 5CC 18FR (CATHETERS) IMPLANT
CATH IMAGER II 65CM (CATHETERS) ×2 IMPLANT
CATH X-FORCE N30 NEPHROSTOMY (TUBING) ×3 IMPLANT
COVER SURGICAL LIGHT HANDLE (MISCELLANEOUS) ×3 IMPLANT
DRAPE C-ARM 42X120 X-RAY (DRAPES) ×3 IMPLANT
DRAPE LINGEMAN PERC (DRAPES) ×3 IMPLANT
DRAPE SURG IRRIG POUCH 19X23 (DRAPES) ×3 IMPLANT
DRSG PAD ABDOMINAL 8X10 ST (GAUZE/BANDAGES/DRESSINGS) ×6 IMPLANT
DRSG TEGADERM 4X4.75 (GAUZE/BANDAGES/DRESSINGS) ×2 IMPLANT
DRSG TEGADERM 8X12 (GAUZE/BANDAGES/DRESSINGS) IMPLANT
FIBER LASER FLEXIVA 1000 (UROLOGICAL SUPPLIES) IMPLANT
FIBER LASER FLEXIVA 200 (UROLOGICAL SUPPLIES) IMPLANT
FIBER LASER FLEXIVA 365 (UROLOGICAL SUPPLIES) IMPLANT
FIBER LASER FLEXIVA 550 (UROLOGICAL SUPPLIES) IMPLANT
FIBER LASER TRAC TIP (UROLOGICAL SUPPLIES) IMPLANT
FLOSEAL 10ML (HEMOSTASIS) ×2 IMPLANT
GAUZE SPONGE 4X4 12PLY STRL (GAUZE/BANDAGES/DRESSINGS) ×3 IMPLANT
GLOVE BIOGEL M 8.0 STRL (GLOVE) ×3 IMPLANT
GOWN STRL REUS W/TWL XL LVL3 (GOWN DISPOSABLE) ×3 IMPLANT
GUIDEWIRE AMPLATZ STIFF 0.35 (WIRE) ×2 IMPLANT
GUIDEWIRE AMPLAZ .035X145 (WIRE) ×6 IMPLANT
GUIDEWIRE STR DUAL SENSOR (WIRE) IMPLANT
HOLDER FOLEY CATH W/STRAP (MISCELLANEOUS) ×3 IMPLANT
KIT BASIN OR (CUSTOM PROCEDURE TRAY) ×3 IMPLANT
MANIFOLD NEPTUNE II (INSTRUMENTS) ×3 IMPLANT
NS IRRIG 1000ML POUR BTL (IV SOLUTION) ×3 IMPLANT
PACK CYSTO (CUSTOM PROCEDURE TRAY) ×3 IMPLANT
PROBE LITHOCLAST ULTRA 3.8X403 (UROLOGICAL SUPPLIES) ×2 IMPLANT
PROBE PNEUMATIC 1.0MMX570MM (UROLOGICAL SUPPLIES) ×2 IMPLANT
SET IRRIG Y TYPE TUR BLADDER L (SET/KITS/TRAYS/PACK) ×3 IMPLANT
SET WARMING FLUID IRRIGATION (MISCELLANEOUS) IMPLANT
SHEATH PEELAWAY SET 9 (SHEATH) ×3 IMPLANT
STENT CONTOUR 6FRX26X.038 (STENTS) ×2 IMPLANT
STONE CATCHER W/TUBE ADAPTER (UROLOGICAL SUPPLIES) ×3 IMPLANT
SURGIFLO W/THROMBIN 8M KIT (HEMOSTASIS) IMPLANT
SUT MNCRL AB 4-0 PS2 18 (SUTURE) ×2 IMPLANT
SUT SILK 2 0 30  PSL (SUTURE)
SUT SILK 2 0 30 PSL (SUTURE) IMPLANT
SYR 20CC LL (SYRINGE) ×6 IMPLANT
SYRINGE 10CC LL (SYRINGE) ×3 IMPLANT
TOWEL OR NON WOVEN STRL DISP B (DISPOSABLE) ×3 IMPLANT
TRAY FOLEY W/METER SILVER 14FR (SET/KITS/TRAYS/PACK) IMPLANT
TRAY FOLEY W/METER SILVER 16FR (SET/KITS/TRAYS/PACK) IMPLANT
TUBING CONNECTING 10 (TUBING) ×6 IMPLANT

## 2015-03-20 NOTE — Anesthesia Procedure Notes (Signed)
Procedure Name: Intubation Date/Time: 03/20/2015 11:20 AM Performed by: Noralyn Pick D Pre-anesthesia Checklist: Patient identified, Emergency Drugs available, Suction available and Patient being monitored Patient Re-evaluated:Patient Re-evaluated prior to inductionOxygen Delivery Method: Circle System Utilized Preoxygenation: Pre-oxygenation with 100% oxygen Intubation Type: IV induction Ventilation: Mask ventilation without difficulty Laryngoscope Size: Mac and 4 Grade View: Grade III Tube type: Oral Tube size: 7.5 mm Number of attempts: 1 Airway Equipment and Method: Stylet and Oral airway Placement Confirmation: ETT inserted through vocal cords under direct vision,  positive ETCO2 and breath sounds checked- equal and bilateral Secured at: 22 cm Tube secured with: Tape Dental Injury: Teeth and Oropharynx as per pre-operative assessment

## 2015-03-20 NOTE — H&P (Signed)
Mr. Arendall is a 59 year old male with a left partial staghorn calculus.   History of Present Illness   Calculus disease: He had a right ureteral stone in 10/01. Dr. Amalia Hailey performed right ureteroscopy and found no stone at the time. A CT scan done in 11/12 revealed no renal calculi present.   Stone analysis: Calcium oxalate monohydrate 50% and calcium oxalate dihydrate 50%.    Right renal cyst: A simple cyst was noted in the right kidney on CT scan in 10/01 and was noted to be unchanged on his CT scan in 11/16.     Interval history:    Past Medical History Problems  1. History of esophageal reflux (Z87.19) 2. History of hypothyroidism (Z86.39) 3. History of sleep apnea (Z87.09)  Surgical History Problems  1. History of No Surgical Problems  Current Meds 1. Dulera 100-5 MCG/ACT Inhalation Aerosol;  Therapy: (Recorded:13Nov2012) to Recorded 2. Nasonex 50 MCG/ACT Nasal Suspension;  Therapy: (Recorded:13Nov2012) to Recorded 3. NexIUM 40 MG Oral Capsule Delayed Release;  Therapy: (Recorded:13Nov2012) to Recorded 4. ZyrTEC Allergy 10 MG Oral Tablet;  Therapy: (Recorded:13Nov2012) to Recorded  Allergies Medication  1. No Known Drug Allergies  Family History Problems  1. Family history of Death In The Family Father 2. Family history of Family Health Status - Mother's Age 20. Family history of Family Health Status Children ___ Living Daughters   1 daughter  Social History Problems  1. Denied: History of Alcohol Use (History) 2. Caffeine Use   1 -2 glasses 3. Former smoker (Z87.891)   1ppd quit in 1985 4. Marital History - Currently Married 5. Occupation:   Respiratory Therapisit  Review of Systems Skin, eye, otolaryngeal, hematologic/lymphatic, cardiovascular, pulmonary, endocrine, gastrointestinal, neurological and psychiatric system(s) were reviewed and pertinent findings if present are noted and are otherwise negative.  Genitourinary: weak urinary stream.   Constitutional: feeling tired (fatigue).  Musculoskeletal: back pain.   Vital Signs  Height: 5 ft 10 in Weight: 280 lb  BMI Calculated: 40.18 BSA Calculated: 2.41 Blood Pressure: 112 / 75 Heart Rate: 73  Physical Exam Constitutional: Well nourished and well developed . No acute distress.   ENT:. The ears and nose are normal in appearance.   Neck: The appearance of the neck is normal and no neck mass is present.   Pulmonary: No respiratory distress and normal respiratory rhythm and effort.   Cardiovascular: Heart rate and rhythm are normal . No peripheral edema.   Lymphatics: The femoral and inguinal nodes are not enlarged or tender.   Abdomen: There is no mass or tenderness.  He has a nephrostomy tube exiting the left flank draining clear urine.  Skin: Normal skin turgor, no visible rash and no visible skin lesions.   Neuro/Psych: Mood and affect are appropriate.       Assessment  We discussed the management of urinary stones. These options include observation, ureteroscopy, shockwave lithotripsy, and PCNL. We discussed which options are relevant to these particular stones. We discussed the natural history of stones as well as the complications of untreated stones and the impact on quality of life without treatment as well as with each of the above listed treatments. We also discussed the efficacy of each treatment in its ability to clear the stone burden. With any of these management options I discussed the signs and symptoms of infection and the need for emergent treatment should these be experienced. For each option we discussed the ability of each procedure to clear the patient of their stone burden.  For observation I described the risks which include but are not limited to silent renal damage, life-threatening infection, need for emergent surgery, failure to pass stone, and pain.    For ureteroscopy I described the risks which include heart attack, stroke,  pulmonary embolus, death, bleeding, infection, damage to contiguous structures, positioning injury, ureteral stricture, ureteral avulsion, ureteral injury, need for ureteral stent, inability to perform ureteroscopy, need for an interval procedure, inability to clear stone burden, stent discomfort and pain.    For shockwave lithotripsy I described the risks which include arrhythmia, kidney contusion, kidney hemorrhage, need for transfusion, long-term risk of diabetes or hypertension, back discomfort, flank ecchymosis, flank abrasion, inability to break up stone, inability to pass stone fragments, Steinstrasse, infection associated with obstructing stones, need for different surgical procedure and possible need for repeat shockwave lithotripsy.    For PCNL I described the risks including heart attack, sure, pulmonary embolus, death, positioning injury, pneumothorax, hydrothorax, need for chest tube, inability to clear stone burden, renal laceration, arterial venous fistula or malformation, need for embolization of kidney, loss of kidney or renal function, need for repeat procedure, need for prolonged nephrostomy tube, ureteral avulsion and fistula.    Because his left renal pelvic stone is ~3 cm in greatest diameter with other stones in the lower pole calyces as well my recommendation has been to proceed with a PCNL. His stone has Hounsfield units of approximately 950. We discussed the fact that there is a high probability of a second look/staged procedure.  Staghorn renal calculus (592.0) (N20.0)   - Left   Plan He will be scheduled for a left percutaneous nephrolithotomy.

## 2015-03-20 NOTE — Anesthesia Postprocedure Evaluation (Signed)
Anesthesia Post Note  Patient: Zachary Jacobs  Procedure(s) Performed: Procedure(s) (LRB): NEPHROLITHOTOMY PERCUTANEOUS (Left)  Patient location during evaluation: PACU Anesthesia Type: General Level of consciousness: awake and alert Pain management: pain level controlled Vital Signs Assessment: post-procedure vital signs reviewed and stable Respiratory status: spontaneous breathing, nonlabored ventilation, respiratory function stable and patient connected to nasal cannula oxygen Cardiovascular status: blood pressure returned to baseline and stable Postop Assessment: No signs of nausea or vomiting Anesthetic complications: no    Last Vitals:  Filed Vitals:   03/20/15 1400 03/20/15 1423  BP: 126/78 137/74  Pulse: 51 52  Temp: 37.1 C 36.5 C  Resp: 15 15    Last Pain:  Filed Vitals:   03/20/15 1730  PainSc: 5                  Tashawn Laswell DAVID

## 2015-03-20 NOTE — Anesthesia Preprocedure Evaluation (Signed)
Anesthesia Evaluation  Patient identified by MRN, date of birth, ID band Patient awake    Reviewed: Allergy & Precautions, NPO status , Patient's Chart, lab work & pertinent test results  Airway Mallampati: II  TM Distance: >3 FB Neck ROM: Full    Dental   Pulmonary sleep apnea , COPD, former smoker,    Pulmonary exam normal        Cardiovascular Normal cardiovascular exam     Neuro/Psych    GI/Hepatic (+) Hepatitis -  Endo/Other    Renal/GU      Musculoskeletal   Abdominal   Peds  Hematology   Anesthesia Other Findings   Reproductive/Obstetrics                             Anesthesia Physical Anesthesia Plan  ASA: III  Anesthesia Plan: General   Post-op Pain Management:    Induction: Intravenous  Airway Management Planned: LMA  Additional Equipment:   Intra-op Plan:   Post-operative Plan: Extubation in OR  Informed Consent: I have reviewed the patients History and Physical, chart, labs and discussed the procedure including the risks, benefits and alternatives for the proposed anesthesia with the patient or authorized representative who has indicated his/her understanding and acceptance.     Plan Discussed with: CRNA and Surgeon  Anesthesia Plan Comments:         Anesthesia Quick Evaluation

## 2015-03-20 NOTE — Progress Notes (Signed)
[name], male   DOB: [birth year], 59 y.o.   MRN: [id number] Night of surgery note  Subjective: The patient is doing well.  No complaints.  Objective: Vital signs in last 24 hours: Temp:  [97.7 F (36.5 C)-98.7 F (37.1 C)] 97.7 F (36.5 C) (11/22 1423) Pulse Rate:  [51-65] 52 (11/22 1423) Resp:  [15-18] 15 (11/22 1423) BP: (120-139)/(63-91) 137/74 mmHg (11/22 1423) SpO2:  [98 %-100 %] 100 % (11/22 1423) Weight:  [122 kg (268 lb 15.4 oz)] 122 kg (268 lb 15.4 oz) (11/22 1430)  Intake/Output this shift: Total I/O In: 1600 [I.V.:1600] Out: 1150 [Urine:1150]  Physical Exam:  General: Alert and oriented. Abdomen: Soft, Nondistended. Incision: Clean with dry, intact dressing. Foley draining blood-tinged urine with no clots.  Lab Results: No results for input(s): HGB, HCT in the last 72 hours.  Assessment/Plan: 1) Continue to monitor 2) Per orders   Caran Storck C. Karsten Ro, [place] C 03/20/2015, 4:30 PM

## 2015-03-20 NOTE — Transfer of Care (Signed)
Immediate Anesthesia Transfer of Care Note  Patient: Zachary Jacobs  Procedure(s) Performed: Procedure(s): NEPHROLITHOTOMY PERCUTANEOUS (Left)  Patient Location: PACU  Anesthesia Type:General  Level of Consciousness: awake, alert  and oriented  Airway & Oxygen Therapy: Patient Spontanous Breathing and Patient connected to face mask oxygen  Post-op Assessment: Report given to RN and Post -op Vital signs reviewed and stable  Post vital signs: Reviewed and stable  Last Vitals:  Filed Vitals:   03/20/15 0849  BP: 120/69  Pulse: 58  Temp: 36.9 C  Resp: 18    Complications: No apparent anesthesia complications

## 2015-03-20 NOTE — Op Note (Addendum)
PATIENT:  Zachary Jacobs  PRE-OPERATIVE DIAGNOSIS: left partial staghorn calculus  POST-OPERATIVE DIAGNOSIS: Same  PROCEDURE: 1. Left antegrade nephrostogram 2. Left percutaneous nephrolithotomy (3 cm) - first part of a staged procedure 3. Left double-J stent placement  SURGEON:  Claybon Jabs  INDICATION: Zachary Jacobs is a 59 year old male who developed left flank pain and was feeling poorly. He was found to have an obstructing partial staghorn calculus involving the lower pole of his left kidney with 2 other stones in the lower pole as well. Because he was obstructed and appeared to have infection he underwent a percutaneous nephrostomy tube placement and the urine was cultured and found to be positive. He was placed on appropriate antibiotics based on sensitivities and is brought to the operating room today for definitive management of his renal calculi.  ANESTHESIA:  General  EBL:  Minimal  DRAINS: 6 [place], 26 centimeter double-J stent in the left ureter and a Foley catheter   LOCAL MEDICATIONS USED:  None  SPECIMEN: Stone given to patient   Description of procedure: After informed consent the patient was taken to the operating room and general anesthesia was then administered. Once fully anesthetizeda Foley catheter was inserted and the patient was moved to the prone position on the operating room table and his left flank as well as nephrostomy catheter were sterilely prepped and draped in standard fashion. An official timeout was then performed.  I initially passed a 0.038 inch guidewire through the previously placed nephrostomy catheter and curled this in the area of the renal pelvis under fluoroscopy. I then placed a angle tip catheter over the guidewire and attempted to pass a guidewire down the ureter but was unsuccessful. I therefore left the guidewire curled in the renal pelvis and passed a coaxial peel-away catheter over the guidewire into the area the renal pelvis  under fluoroscopy and then remove the inner portion and passed a second guidewire into the area the renal pelvis as well. One of the guidewires was secured to the drape as a safety guidewire and the second was used as a working guidewire. I passed the nephrostomy dilating balloon over the guidewire into the area the renal pelvis by fluoroscopy and inflated the balloon. The nephrostomy sheath was then passed over the balloon and into the area of the renal pelvis and the balloon was deflated and removed.  The 27 French rigid nephroscope was then passed under direct vision down the access sheath and into the area the renal pelvis where the stone was identified. I used the Electrical engineer to fragment the stone and remove the fragments. Intermittently I used the 2 prong grasper to grasp fragments and remove these throughout the case until all of the stone in the renal pelvis was gone. I then inspected the lower pole first with the rigid nephroscope and found 2 other stones that I thought were likely the stones in the lower pole seen on his CT scan. These were removed. I then removed the rigid nephroscope and inserted the flexible cystoscope and inspected the upper, middle and lower poles and was unable to identify any remaining stones.  I then passed the rigid nephroscope in to the renal pelvis and identified the ureteropelvic junction and passed an open-ended catheter down the ureter. A guidewire was then passed through this and into the bladder where curl in the bladder was identified by fluoroscopy. The guidewire was left in place and the open-ended catheter removed and a stent was passed  over the guidewire down into the area of the bladder. As I removed the guidewire there was good curl noted in the bladder and in the renal pelvis. The stent positioning was confirmed with fluoroscopy and visually.  Because there was minimal bleeding during the operation and very little bleeding whatsoever at the end I elected  to perform this operation "tubeless" and therefore pulled the sheath back to the level of the renal parenchyma and measured this distance to the end of the nephrostomy sheath. This distance was then used to position the laparoscopic introducer to that same depth and I placed FloSeal in the area of the renal parenchyma and within the nephrostomy tract. This was placed as I removed the nephrostomy sheath. There was no bleeding noted from the nephrostomy site. I therefore closed the skin with a running, subcuticular 4-0 Monocryl suture and applied bacitracin ointment to the wound followed by sterile gauze and an occlusive Tegaderm dressing. The patient was awakened and taken to the recovery room in stable and satisfactory condition. He tolerated the procedure well and there were no intraoperative complications.  PLAN OF CARE: Discharge to home after PACU  PATIENT DISPOSITION:  PACU - hemodynamically stable.  Of note prior to placing the guidewire initially through the nephrostomy catheter at the beginning of the case I did inject full strength Omnipaque contrast through the nephrostomy catheter in order to perform an antegrade nephrostogram.  This revealed a filling defect in the renal pelvis, a decompressed collecting system with free flow down the ureter.

## 2015-03-20 NOTE — Discharge Instructions (Signed)

## 2015-03-21 DIAGNOSIS — N200 Calculus of kidney: Secondary | ICD-10-CM | POA: Diagnosis not present

## 2015-03-21 MED ORDER — OXYCODONE HCL 10 MG PO TABS
10.0000 mg | ORAL_TABLET | ORAL | Status: DC | PRN
Start: 2015-03-21 — End: 2015-06-11

## 2015-03-21 MED ORDER — DOXYCYCLINE HYCLATE 50 MG PO CAPS: 50.0000 mg | ORAL_CAPSULE | Freq: Every day | ORAL | Status: DC

## 2015-03-21 NOTE — Discharge Summary (Signed)
Physician Discharge Summary  [name] MRN: [id number] DOB/AGE: [birth year] 59 y.o.  Admit date: 03/20/2015 Discharge date: 03/21/2015  Admission Diagnoses: left partial staghorn calculus  Discharge Diagnoses:  Active Problems:   Staghorn renal calculus   Discharged Condition: good  Hospital Course: He initially was found to have an obstructing left partial staghorn calculus associated with signs of infection and therefore underwent a left percutaneous nephrostomy tube placement.  There was purulent fluid present and his culture grew coag-negative staph.  He was treated with appropriate antibiotics and brought to the operating room for elective treatment of his stone. He underwent a left percutaneous nephrolithotomy which proceeded without complication and was performed tubeless with a stent in the left ureter.  He did well postoperatively with minimal pain.  A postoperative CT scan was obtained which revealed 3 stones remaining present in calyces that were not accessible at the time of his procedure and we discussed the fact that this would likely require 2 stages in order to clear him of all of his stone material.  Because he did have infected urine and a fairly soft stone I will maintain him on low-dose nightly doxycycline until his second procedure. We discussed his second procedure in detail which will require ureteroscopy as an outpatient.  I went over this procedure with him and how it was performed, its risks and complications, the possible need for a stent after the procedure, the probability of success and the anticipated postoperative course.  His questions were answered and he is electing to proceed so I will schedule him for this.  He is otherwise felt ready for discharge at this time.    Discharge Exam: Blood pressure 115/58, pulse 63, temperature 98.6 F (37 C), temperature source Oral, resp. rate 16, height 5\' 9"  (1.753 m), weight 122 kg (268 lb 15.4 oz),  SpO2 97 %. General appearance: alert, cooperative and no distress  Abdomen is obese and soft. Flank reveals an intact dressing that is dry.  Disposition: 01-Home or Self Care  Discharge Instructions    Discharge patient    Complete by:  As directed             Medication List    STOP taking these medications        doxycycline 100 MG EC tablet  Commonly known as:  DORYX  Replaced by:  doxycycline 50 MG capsule     sulfamethoxazole-trimethoprim 800-160 MG tablet  Commonly known as:  BACTRIM DS,SEPTRA DS      TAKE these medications        albuterol 108 (90 BASE) MCG/ACT inhaler  Commonly known as:  PROVENTIL HFA;VENTOLIN HFA  Inhale 2 puffs into the lungs every 6 (six) hours as needed for wheezing or shortness of breath.     budesonide-formoterol 80-4.5 MCG/ACT inhaler  Commonly known as:  SYMBICORT  Inhale 2 puffs into the lungs 2 (two) times daily.     doxycycline 50 MG capsule  Commonly known as:  VIBRAMYCIN  Take 1 capsule (50 mg total) by mouth daily.     fluticasone 50 MCG/ACT nasal spray  Commonly known as:  FLONASE  Place 2 sprays into both nostrils daily. During allergy season only.     ibuprofen 200 MG tablet  Commonly known as:  ADVIL,MOTRIN  Take 200-600 mg by mouth every 6 (six) hours as needed (pain).     Oxycodone HCl 10 MG Tabs  Take 1 tablet (10 mg total) by mouth every 4 (four) hours  as needed.     tamsulosin 0.4 MG Caps capsule  Commonly known as:  FLOMAX  Take 1 capsule (0.4 mg total) by mouth daily.           Follow-up Information    Follow up with Claybon Jabs, MD On 03/27/2015.   Specialty:  Urology   Why:  For your appiontment at 11:00   Contact information:   [address] [id number] [phone]       Signed: Claybon Jabs 03/21/2015, 7:17 AM

## 2015-03-21 NOTE — Progress Notes (Signed)
Completed  D/C teaching with patient. Answered all questions. Pt will be D.C home with family in stable condition. 

## 2015-03-23 ENCOUNTER — Other Ambulatory Visit: Payer: Self-pay | Admitting: Internal Medicine

## 2015-03-23 ENCOUNTER — Ambulatory Visit (INDEPENDENT_AMBULATORY_CARE_PROVIDER_SITE_OTHER): Payer: 59 | Admitting: Family Medicine

## 2015-03-23 VITALS — BP 120/70 | HR 69 | Temp 98.3°F | Resp 18 | Ht 69.0 in | Wt 274.0 lb

## 2015-03-23 DIAGNOSIS — N200 Calculus of kidney: Secondary | ICD-10-CM

## 2015-03-23 MED ORDER — TAMSULOSIN HCL 0.4 MG PO CAPS: 0.4000 mg | ORAL_CAPSULE | Freq: Every day | ORAL | Status: DC

## 2015-03-23 NOTE — Progress Notes (Signed)
   Subjective:    [name], male    DOB: 1955/11/13, 59 y.o.   MRN: [id number]  HPI This is a pleasant 59 yo male who presents toady for refill of his tamsulosin. He had surgery to remove kidney stones 03/20/15. He was discharged the following day. He brings his discharge summary today. He was to continue his flomax and he is out. He has noticed that his urine stream is not as strong as it was on the flomax. His pain has been very manageable. He has not had any fever or chills. No nausea or vomiting. No cough. Some minor throat irritation. No edema.   Past Medical History  Diagnosis Date  . Obesity, Class III, BMI 40-49.9 (morbid obesity) ([facility])   . Kidney stones   . Sleep apnea     no cpap   . Sleep apnea   . Sleep apnea   . COPD (chronic obstructive pulmonary disease) ([facility])     mild   . Pneumonia     hx of   . Hepatitis     low titer hep B per patient    Past Surgical History  Procedure Laterality Date  . Rhinoplasty      also involved throat surgery   . Cystoscopy w/ ureteral stent placement  2005  . Nephrostomy Left 03/08/15    Tube placed by Radiologist  . Nephrolithotomy Left 03/20/2015    Procedure: NEPHROLITHOTOMY PERCUTANEOUS;  Surgeon: Kathie Rhodes, MD;  Location: [facility];  Service: Urology;  Laterality: Left;   Family History  Problem Relation Age of Onset  . Cancer Other   . Obesity Other   . Cancer Mother   . Heart disease Father    Social History  Substance Use Topics  . Smoking status: Former Smoker    Types: Cigarettes  . Smokeless tobacco: None  . Alcohol Use: No   Review of Systems Per HPI     Objective:   Physical Exam Physical Exam  Constitutional: Oriented to person, place, and time. He appears well-developed and well-nourished.  HENT:  Head: Normocephalic and atraumatic.  Eyes: Conjunctivae are normal.  Neck: Normal range of motion. Neck supple.  Cardiovascular: Normal rate, regular rhythm and normal heart sounds.     Pulmonary/Chest: Effort normal and breath sounds normal.  Musculoskeletal: Normal range of motion.  Neurological: Alert and oriented to person, place, and time.  Skin: Skin is warm and dry.  Psychiatric: Normal mood and affect. Behavior is normal. Judgment and thought content normal.  Vitals reviewed. BP 120/70 mmHg  Pulse 69  Temp(Src) 98.3 F (36.8 C) (Oral)  Resp 18  Ht 5\' 9"  (1.753 m)  Wt 274 lb (124.286 kg)  BMI 40.44 kg/m2  SpO2 98%     Assessment & Plan:  1. Renal stone - tamsulosin (FLOMAX) 0.4 MG CAPS capsule; Take 1 capsule (0.4 mg total) by mouth daily.  Dispense: 30 capsule; Refill: 0 - Encouraged good fluid intake, continued use of incentive spirometry, RTC/ED if he develops fever/chills, any difficulty urinating  [name], FNP-BC  [facility]  03/23/2015 6:21 PM

## 2015-03-27 ENCOUNTER — Other Ambulatory Visit: Payer: Self-pay | Admitting: Internal Medicine

## 2015-03-28 ENCOUNTER — Encounter (HOSPITAL_BASED_OUTPATIENT_CLINIC_OR_DEPARTMENT_OTHER): Payer: Self-pay | Admitting: *Deleted

## 2015-03-28 ENCOUNTER — Other Ambulatory Visit: Payer: Self-pay | Admitting: Urology

## 2015-03-28 NOTE — Progress Notes (Signed)
NPO AFTER MN.  ARRIVE AT 0930. NEEDS KUB.  CURRENT LAB RESULTS IN CHART AND EPIC.   WILL TAKE FLOMAX AND DO SYMBICORT INHALER DOS W/ SIPS OF WATER.

## 2015-03-30 ENCOUNTER — Encounter (HOSPITAL_BASED_OUTPATIENT_CLINIC_OR_DEPARTMENT_OTHER): Payer: Self-pay | Admitting: *Deleted

## 2015-03-30 ENCOUNTER — Ambulatory Visit (HOSPITAL_COMMUNITY): Payer: 59

## 2015-03-30 ENCOUNTER — Encounter (HOSPITAL_BASED_OUTPATIENT_CLINIC_OR_DEPARTMENT_OTHER)
Admission: RE | Disposition: A | Discharge status: Home / Self Care | Payer: Self-pay | Source: Ambulatory Visit | Attending: Urology

## 2015-03-30 ENCOUNTER — Ambulatory Visit (HOSPITAL_BASED_OUTPATIENT_CLINIC_OR_DEPARTMENT_OTHER): Payer: 59 | Admitting: Anesthesiology

## 2015-03-30 ENCOUNTER — Ambulatory Visit (HOSPITAL_BASED_OUTPATIENT_CLINIC_OR_DEPARTMENT_OTHER)
Admission: RE | Admit: 2015-03-30 | Discharge: 2015-03-30 | Disposition: A | Discharge status: Home / Self Care | Payer: 59 | Source: Ambulatory Visit | Attending: Urology | Admitting: Urology

## 2015-03-30 DIAGNOSIS — Z79899 Other long term (current) drug therapy: Secondary | ICD-10-CM | POA: Diagnosis not present

## 2015-03-30 DIAGNOSIS — G4730 Sleep apnea, unspecified: Secondary | ICD-10-CM | POA: Insufficient documentation

## 2015-03-30 DIAGNOSIS — Z87891 Personal history of nicotine dependence: Secondary | ICD-10-CM | POA: Diagnosis not present

## 2015-03-30 DIAGNOSIS — Z7951 Long term (current) use of inhaled steroids: Secondary | ICD-10-CM | POA: Diagnosis not present

## 2015-03-30 DIAGNOSIS — Z6839 Body mass index (BMI) 39.0-39.9, adult: Secondary | ICD-10-CM | POA: Diagnosis not present

## 2015-03-30 DIAGNOSIS — Z87442 Personal history of urinary calculi: Secondary | ICD-10-CM | POA: Insufficient documentation

## 2015-03-30 DIAGNOSIS — K219 Gastro-esophageal reflux disease without esophagitis: Secondary | ICD-10-CM | POA: Diagnosis not present

## 2015-03-30 DIAGNOSIS — J449 Chronic obstructive pulmonary disease, unspecified: Secondary | ICD-10-CM | POA: Insufficient documentation

## 2015-03-30 DIAGNOSIS — N200 Calculus of kidney: Secondary | ICD-10-CM | POA: Insufficient documentation

## 2015-03-30 DIAGNOSIS — E039 Hypothyroidism, unspecified: Secondary | ICD-10-CM | POA: Diagnosis not present

## 2015-03-30 HISTORY — DX: Personal history of urinary calculi: Z87.442

## 2015-03-30 HISTORY — DX: Chronic obstructive pulmonary disease, unspecified: J44.9

## 2015-03-30 HISTORY — PX: HOLMIUM LASER APPLICATION: SHX5852

## 2015-03-30 HISTORY — PX: CYSTOSCOPY/URETEROSCOPY/HOLMIUM LASER/STENT PLACEMENT: SHX6546

## 2015-03-30 HISTORY — DX: Obstructive sleep apnea (adult) (pediatric): G47.33

## 2015-03-30 HISTORY — DX: Cyst of kidney, acquired: N28.1

## 2015-03-30 HISTORY — DX: Calculus of kidney: N20.0

## 2015-03-30 SURGERY — CYSTOSCOPY/URETEROSCOPY/HOLMIUM LASER/STENT PLACEMENT
Anesthesia: General | Site: Ureter | Laterality: Left

## 2015-03-30 MED ORDER — TAMSULOSIN HCL 0.4 MG PO CAPS
0.4000 mg | ORAL_CAPSULE | Freq: Once | ORAL | Status: AC
  Administered 2015-03-30: 0.4 mg via ORAL
  Filled 2015-03-30: qty 1

## 2015-03-30 MED ORDER — PHENAZOPYRIDINE HCL 100 MG PO TABS
ORAL_TABLET | ORAL | Status: AC
  Filled 2015-03-30: qty 2

## 2015-03-30 MED ORDER — PROPOFOL 10 MG/ML IV BOLUS
INTRAVENOUS | Status: AC
  Filled 2015-03-30: qty 40

## 2015-03-30 MED ORDER — LIDOCAINE HCL (CARDIAC) 20 MG/ML IV SOLN
INTRAVENOUS | Status: AC
  Filled 2015-03-30: qty 5

## 2015-03-30 MED ORDER — ACETAMINOPHEN 10 MG/ML IV SOLN
INTRAVENOUS | Status: AC
  Filled 2015-03-30: qty 100

## 2015-03-30 MED ORDER — TAMSULOSIN HCL 0.4 MG PO CAPS
ORAL_CAPSULE | ORAL | Status: AC
  Filled 2015-03-30: qty 1

## 2015-03-30 MED ORDER — SODIUM CHLORIDE 0.9 % IR SOLN
Status: DC | PRN
  Administered 2015-03-30: 1000 mL
  Administered 2015-03-30: 3000 mL

## 2015-03-30 MED ORDER — OXYCODONE HCL 10 MG PO TABS: 10.0000 mg | ORAL_TABLET | ORAL | Status: DC | PRN

## 2015-03-30 MED ORDER — LACTATED RINGERS IV SOLN
INTRAVENOUS | Status: DC
  Administered 2015-03-30 (×2): via INTRAVENOUS
  Filled 2015-03-30: qty 1000

## 2015-03-30 MED ORDER — ONDANSETRON HCL 4 MG/2ML IJ SOLN
INTRAMUSCULAR | Status: DC | PRN
  Administered 2015-03-30: 4 mg via INTRAVENOUS

## 2015-03-30 MED ORDER — FENTANYL CITRATE (PF) 250 MCG/5ML IJ SOLN
INTRAMUSCULAR | Status: AC
  Filled 2015-03-30: qty 5

## 2015-03-30 MED ORDER — DEXAMETHASONE SODIUM PHOSPHATE 10 MG/ML IJ SOLN
INTRAMUSCULAR | Status: DC | PRN
  Administered 2015-03-30: 10 mg via INTRAVENOUS

## 2015-03-30 MED ORDER — PROPOFOL 10 MG/ML IV BOLUS
INTRAVENOUS | Status: DC | PRN
  Administered 2015-03-30: 50 mg via INTRAVENOUS
  Administered 2015-03-30: 250 mg via INTRAVENOUS

## 2015-03-30 MED ORDER — CIPROFLOXACIN IN D5W 400 MG/200ML IV SOLN
400.0000 mg | INTRAVENOUS | Status: AC
  Administered 2015-03-30: 400 mg via INTRAVENOUS
  Filled 2015-03-30: qty 200

## 2015-03-30 MED ORDER — LIDOCAINE HCL (CARDIAC) 20 MG/ML IV SOLN
INTRAVENOUS | Status: DC | PRN
  Administered 2015-03-30: 100 mg via INTRAVENOUS

## 2015-03-30 MED ORDER — DEXAMETHASONE SODIUM PHOSPHATE 10 MG/ML IJ SOLN
INTRAMUSCULAR | Status: AC
  Filled 2015-03-30: qty 1

## 2015-03-30 MED ORDER — MIDAZOLAM HCL 5 MG/5ML IJ SOLN
INTRAMUSCULAR | Status: DC | PRN
  Administered 2015-03-30: 2 mg via INTRAVENOUS

## 2015-03-30 MED ORDER — PHENAZOPYRIDINE HCL 200 MG PO TABS
200.0000 mg | ORAL_TABLET | Freq: Once | ORAL | Status: AC
  Administered 2015-03-30: 200 mg via ORAL
  Filled 2015-03-30: qty 1

## 2015-03-30 MED ORDER — CIPROFLOXACIN IN D5W 400 MG/200ML IV SOLN
INTRAVENOUS | Status: AC
  Filled 2015-03-30: qty 200

## 2015-03-30 MED ORDER — FENTANYL CITRATE (PF) 100 MCG/2ML IJ SOLN
INTRAMUSCULAR | Status: DC | PRN
  Administered 2015-03-30: 50 ug via INTRAVENOUS
  Administered 2015-03-30: 25 ug via INTRAVENOUS
  Administered 2015-03-30: 50 ug via INTRAVENOUS
  Administered 2015-03-30 (×2): 25 ug via INTRAVENOUS

## 2015-03-30 MED ORDER — PHENAZOPYRIDINE HCL 200 MG PO TABS: 200.0000 mg | ORAL_TABLET | Freq: Three times a day (TID) | ORAL | Status: DC | PRN

## 2015-03-30 MED ORDER — ONDANSETRON HCL 4 MG/2ML IJ SOLN
INTRAMUSCULAR | Status: AC
  Filled 2015-03-30: qty 2

## 2015-03-30 MED ORDER — MIDAZOLAM HCL 2 MG/2ML IJ SOLN
INTRAMUSCULAR | Status: AC
  Filled 2015-03-30: qty 2

## 2015-03-30 MED ORDER — GLYCOPYRROLATE 0.2 MG/ML IJ SOLN
INTRAMUSCULAR | Status: AC
  Filled 2015-03-30: qty 1

## 2015-03-30 MED ORDER — ACETAMINOPHEN 10 MG/ML IV SOLN
INTRAVENOUS | Status: DC | PRN
  Administered 2015-03-30: 1000 mg via INTRAVENOUS

## 2015-03-30 MED ORDER — KETOROLAC TROMETHAMINE 30 MG/ML IJ SOLN
INTRAMUSCULAR | Status: AC
  Filled 2015-03-30: qty 1

## 2015-03-30 SURGICAL SUPPLY — 42 items
ADAPTER CATH URET PLST 4-6FR (CATHETERS) IMPLANT
ADPR CATH URET STRL DISP 4-6FR (CATHETERS)
BAG DRAIN URO-CYSTO SKYTR STRL (DRAIN) ×3 IMPLANT
BAG DRN UROCATH (DRAIN) ×2
BASKET LASER NITINOL 1.9FR (BASKET) IMPLANT
BASKET STNLS GEMINI 4WIRE 3FR (BASKET) IMPLANT
BASKET ZERO TIP NITINOL 2.4FR (BASKET) IMPLANT
BSKT STON RTRVL 120 1.9FR (BASKET)
BSKT STON RTRVL GEM 120X11 3FR (BASKET)
BSKT STON RTRVL ZERO TP 2.4FR (BASKET)
CATH INTERMIT  6FR 70CM (CATHETERS) ×1 IMPLANT
CATH URET 5FR 28IN CONE TIP (BALLOONS)
CATH URET 5FR 70CM CONE TIP (BALLOONS) IMPLANT
CLOTH BEACON ORANGE TIMEOUT ST (SAFETY) ×3 IMPLANT
DAKOTA DEVICE ×2 IMPLANT
FIBER LASER FLEXIVA 365 (UROLOGICAL SUPPLIES) IMPLANT
FIBER LASER FLEXIVA 550 (UROLOGICAL SUPPLIES) IMPLANT
FIBER LASER TRAC TIP (UROLOGICAL SUPPLIES) ×1 IMPLANT
GLOVE BIO SURGEON STRL SZ 6.5 (GLOVE) ×1 IMPLANT
GLOVE BIO SURGEON STRL SZ7.5 (GLOVE) ×1 IMPLANT
GLOVE BIO SURGEON STRL SZ8 (GLOVE) ×3 IMPLANT
GLOVE INDICATOR 6.5 STRL GRN (GLOVE) ×1 IMPLANT
GLOVE INDICATOR 7.5 STRL GRN (GLOVE) ×1 IMPLANT
GOWN STRL REUS W/ TWL LRG LVL3 (GOWN DISPOSABLE) ×2 IMPLANT
GOWN STRL REUS W/ TWL XL LVL3 (GOWN DISPOSABLE) ×2 IMPLANT
GOWN STRL REUS W/TWL LRG LVL3 (GOWN DISPOSABLE) ×6
GOWN STRL REUS W/TWL XL LVL3 (GOWN DISPOSABLE) ×4 IMPLANT
GUIDEWIRE 0.038 PTFE COATED (WIRE) IMPLANT
GUIDEWIRE ANG ZIPWIRE 038X150 (WIRE) IMPLANT
GUIDEWIRE STR DUAL SENSOR (WIRE) ×3 IMPLANT
IV NS IRRIG 3000ML ARTHROMATIC (IV SOLUTION) ×5 IMPLANT
KIT BALLIN UROMAX 15FX10 (LABEL) IMPLANT
KIT BALLN UROMAX 15FX4 (MISCELLANEOUS) IMPLANT
KIT BALLN UROMAX 26 75X4 (MISCELLANEOUS)
KIT ROOM TURNOVER WOR (KITS) ×3 IMPLANT
MANIFOLD NEPTUNE II (INSTRUMENTS) IMPLANT
PACK CYSTO (CUSTOM PROCEDURE TRAY) ×3 IMPLANT
SET HIGH PRES BAL DIL (LABEL)
SHEATH ACCESS URETERAL 38CM (SHEATH) ×1 IMPLANT
STENT URET 6FRX24 CONTOUR (STENTS) ×1 IMPLANT
TUBE CONNECTING 12X1/4 (SUCTIONS) ×1 IMPLANT
WATER STERILE IRR 3000ML UROMA (IV SOLUTION) IMPLANT

## 2015-03-30 NOTE — Discharge Instructions (Signed)

## 2015-03-30 NOTE — Op Note (Signed)
PATIENT:  Zachary Jacobs  PRE-OPERATIVE DIAGNOSIS:  Left renal calculi  POST-OPERATIVE DIAGNOSIS: Same  PROCEDURE:  1. Removal of left ureteral stent 2. Left retrograde pyelogram with interpretation 3. Left ureteroscopy, laser lithotripsy and stone extraction. 4. Left double-J stent placement  SURGEON: Claybon Jabs, MD  INDICATION: Mr. Easterwood is a 59 year old male who had a infected staghorn calculus of left kidney. He underwent a PCNL and I was able to remove the majority of the stone but stone fragments remained in the lower pole and he is therefore brought back for the second stage of a two-stage procedure in order to clear all of his remaining stone.  ANESTHESIA:  General  EBL:  Minimal  DRAINS: 6 [place], 24 Center meter double-J stent in the left ureter (with string)  SPECIMEN:  None  DESCRIPTION OF PROCEDURE: The patient was taken to the major OR and placed on the table. General anesthesia was administered and then the patient was moved to the dorsal lithotomy position. The genitalia was sterilely prepped and draped. An official timeout was performed.  Initially the 82 French cystoscope with 30 lens was passed under direct vision into the bladder. The stent was identified exiting the left ureter and was grasped with alligator forceps at the tip and drawn through the urethra out to the tip. I passed a 0.038 inch floppy-tipped guidewire through the stent and into the area the renal pelvis and left this in place and removed the stent. Over the guidewire I passed a 6 [place] open-ended ureteral catheter into the area the renal pelvis and removed the guidewire.  A retrograde pyelogram was performed by injecting full-strength contrast through the open-ended catheter under direct fluoroscopic control. It revealed a normal appearing collecting system with what appeared to be filling defects in the lower pole calyces consistent with the stone seen on preop KUB. I then passed a 0.038  inch floppy-tipped guidewire through the open ended catheter and into the area of the renal pelvis and this was left in place. The ureteral access sheath was then easily passed over the guidewire and the inner portion of the access sheath as well as guidewire were removed and the sheath secured to the drape. I then proceeded with ureteroscopy.  A 6 French dual channel flexible, digital ureteroscope was then passed up through the access sheath into the area the renal pelvis. I then visually inspected each calyx starting in the upper pole and noted no stones in the upper pole or in the middle pole calyces. In the lower pole there were 2 calyces which contained a conglomeration of stones ranging in size. I used a basket to extract the larger sized stones from each of the calyces. Multiple passes were required as there were multiple stones. I got to the point where the stones that were left were 1 mm but there were multiple small pieces so I passed the 200  holmium laser fiber through the scope and essentially turned these remaining stones into "dust". Reinspection revealed no significant calculi within the left kidney so I passed a guidewire through the ureteroscope into the area the renal pelvis and removed the ureteroscope as well as the access sheath. I then backloaded the cystoscope over the guidewire and passed the stent over the guidewire into the area of the renal pelvis. As the guidewire was removed good curl was noted in the renal pelvis. The bladder was drained and the cystoscope was then removed. The patient tolerated the procedure well no intraoperative complications.  I informed the patient that he could remove his stent at home with the tether in 3 days.  PLAN OF CARE: Discharge to home after PACU  PATIENT DISPOSITION:  PACU - hemodynamically stable.

## 2015-03-30 NOTE — Anesthesia Preprocedure Evaluation (Addendum)
Anesthesia Evaluation  Patient identified by MRN, date of birth, ID band Patient awake    Reviewed: Allergy & Precautions, H&P , NPO status , Patient's Chart, lab work & pertinent test results  Airway Mallampati: II  TM Distance: >3 FB Neck ROM: Full    Dental no notable dental hx. (+) Dental Advisory Given, Teeth Intact   Pulmonary sleep apnea , COPD,  COPD inhaler, former smoker,  Had sleep apnea surgery   Pulmonary exam normal breath sounds clear to auscultation       Cardiovascular Exercise Tolerance: Good negative cardio ROS Normal cardiovascular exam Rhythm:regular Rate:Normal     Neuro/Psych negative neurological ROS  negative psych ROS   GI/Hepatic negative GI ROS, Neg liver ROS,   Endo/Other  negative endocrine ROSMorbid obesity  Renal/GU negative Renal ROS  negative genitourinary   Musculoskeletal   Abdominal (+) + obese,   Peds  Hematology negative hematology ROS (+)   Anesthesia Other Findings   Reproductive/Obstetrics negative OB ROS                            Anesthesia Physical Anesthesia Plan  ASA: III  Anesthesia Plan: General   Post-op Pain Management:    Induction: Intravenous  Airway Management Planned: LMA  Additional Equipment:   Intra-op Plan:   Post-operative Plan:   Informed Consent: I have reviewed the patients History and Physical, chart, labs and discussed the procedure including the risks, benefits and alternatives for the proposed anesthesia with the patient or authorized representative who has indicated his/her understanding and acceptance.   Dental Advisory Given  Plan Discussed with: CRNA and Surgeon  Anesthesia Plan Comments:         Anesthesia Quick Evaluation

## 2015-03-30 NOTE — Anesthesia Postprocedure Evaluation (Signed)
Anesthesia Post Note  Patient: Zachary Jacobs  Procedure(s) Performed: Procedure(s) (LRB): LEFT URETEROSCOPY/HOLMIUM LASER LITHOTRIPSY/STENT PLACEMENT, WITH  STONE BASKET EXTRACTION (Left) HOLMIUM LASER APPLICATION (Left)  Patient location during evaluation: PACU Anesthesia Type: General Level of consciousness: awake and alert Pain management: pain level controlled Vital Signs Assessment: post-procedure vital signs reviewed and stable Respiratory status: spontaneous breathing, nonlabored ventilation, respiratory function stable and patient connected to nasal cannula oxygen Cardiovascular status: blood pressure returned to baseline and stable Postop Assessment: no signs of nausea or vomiting Anesthetic complications: no    Last Vitals:  Filed Vitals:   03/30/15 1300 03/30/15 1315  BP: 97/55 108/52  Pulse: 46 49  Temp:    Resp: 9 15    Last Pain: There were no vitals filed for this visit.               Ethyl Vila L

## 2015-03-30 NOTE — Transfer of Care (Signed)
Immediate Anesthesia Transfer of Care Note  Patient: CHASTEN CROMPTON  Procedure(s) Performed: Procedure(s) (LRB): LEFT URETEROSCOPY/HOLMIUM LASER LITHOTRIPSY/STENT PLACEMENT, WITH  STONE BASKET EXTRACTION (Left) HOLMIUM LASER APPLICATION (Left)  Patient Location: PACU  Anesthesia Type: General  Level of Consciousness: awake, oriented, sedated and patient cooperative  Airway & Oxygen Therapy: Patient Spontanous Breathing and Patient connected to face mask oxygen  Post-op Assessment: Report given to PACU RN and Post -op Vital signs reviewed and stable  Post vital signs: Reviewed and stable  Complications: No apparent anesthesia complications

## 2015-03-30 NOTE — Anesthesia Procedure Notes (Addendum)
Procedure Name: LMA Insertion Date/Time: 03/30/2015 11:03 AM Performed by: Justice Rocher Pre-anesthesia Checklist: Patient identified, Emergency Drugs available, Suction available and Patient being monitored Patient Re-evaluated:Patient Re-evaluated prior to inductionOxygen Delivery Method: Circle System Utilized Preoxygenation: Pre-oxygenation with 100% oxygen Intubation Type: IV induction Ventilation: Mask ventilation without difficulty LMA: LMA inserted LMA Size: 5.0 Number of attempts: 1 Airway Equipment and Method: Bite block Placement Confirmation: positive ETCO2 Tube secured with: Tape Dental Injury: Teeth and Oropharynx as per pre-operative assessment  Comments: Grey shoulder wedge support used with yellow gel pillow / headrest with blankets for support

## 2015-03-31 NOTE — Interval H&P Note (Signed)
History and Physical Interval Note:  03/31/2015 10:27 AM   He underwent a left percutaneous nephrolithotomy which proceeded without complication and was performed tubeless with a stent in the left ureter. He did well postoperatively with minimal pain. A postoperative CT scan was obtained which revealed 3 stones remaining present in calyces that were not accessible at the time of his procedure and we discussed the fact that this would likely require 2 stages in order to clear him of all of his stone material. Because he did have infected urine and a fairly soft stone I will maintain him on low-dose nightly doxycycline until his second procedure. We discussed his second procedure in detail which will require ureteroscopy as an outpatient. I went over this procedure with him and how it was performed, its risks and complications, the possible need for a stent after the procedure, the probability of success and the anticipated postoperative course. His questions were answered and he is electing to proceed so I will schedule him for this.  He is otherwise felt ready for discharge at this time.  [name]  has presented today for surgery, with the diagnosis of left renal calculi  The various methods of treatment have been discussed with the patient and family. After consideration of risks, benefits and other options for treatment, the patient has consented to  Procedure(s): LEFT URETEROSCOPY/HOLMIUM LASER LITHOTRIPSY/STENT PLACEMENT, WITH  STONE BASKET EXTRACTION (Left) HOLMIUM LASER APPLICATION (Left) as a surgical intervention .  The patient's history has been reviewed, patient examined, no change in status, stable for surgery.  I have reviewed the patient's chart and labs.  Questions were answered to the patient's satisfaction.     Zachary Jacobs

## 2015-03-31 NOTE — H&P (View-Only) (Signed)
Zachary Jacobs is a 59 year old male with a left partial staghorn calculus.   History of Present Illness   Calculus disease: He had a right ureteral stone in 10/01. Dr. Amalia Hailey performed right ureteroscopy and found no stone at the time. A CT scan done in 11/12 revealed no renal calculi present.   Stone analysis: Calcium oxalate monohydrate 50% and calcium oxalate dihydrate 50%.    Right renal cyst: A simple cyst was noted in the right kidney on CT scan in 10/01 and was noted to be unchanged on his CT scan in 11/16.     Interval history:    Past Medical History Problems  1. History of esophageal reflux (Z87.19) 2. History of hypothyroidism (Z86.39) 3. History of sleep apnea (Z87.09)  Surgical History Problems  1. History of No Surgical Problems  Current Meds 1. Dulera 100-5 MCG/ACT Inhalation Aerosol;  Therapy: (Recorded:13Nov2012) to Recorded 2. Nasonex 50 MCG/ACT Nasal Suspension;  Therapy: (Recorded:13Nov2012) to Recorded 3. NexIUM 40 MG Oral Capsule Delayed Release;  Therapy: (Recorded:13Nov2012) to Recorded 4. ZyrTEC Allergy 10 MG Oral Tablet;  Therapy: (Recorded:13Nov2012) to Recorded  Allergies Medication  1. No Known Drug Allergies  Family History Problems  1. Family history of Death In The Family Father 2. Family history of Family Health Status - Mother's Age 47. Family history of Family Health Status Children ___ Living Daughters   1 daughter  Social History Problems  1. Denied: History of Alcohol Use (History) 2. Caffeine Use   1 -2 glasses 3. Former smoker (Z87.891)   1ppd quit in 1985 4. Marital History - Currently Married 5. Occupation:   Respiratory Therapisit  Review of Systems Skin, eye, otolaryngeal, hematologic/lymphatic, cardiovascular, pulmonary, endocrine, gastrointestinal, neurological and psychiatric system(s) were reviewed and pertinent findings if present are noted and are otherwise negative.  Genitourinary: weak urinary stream.   Constitutional: feeling tired (fatigue).  Musculoskeletal: back pain.   Vital Signs  Height: 5 ft 10 in Weight: 280 lb  BMI Calculated: 40.18 BSA Calculated: 2.41 Blood Pressure: 112 / 75 Heart Rate: 73  Physical Exam Constitutional: Well nourished and well developed . No acute distress.   ENT:. The ears and nose are normal in appearance.   Neck: The appearance of the neck is normal and no neck mass is present.   Pulmonary: No respiratory distress and normal respiratory rhythm and effort.   Cardiovascular: Heart rate and rhythm are normal . No peripheral edema.   Lymphatics: The femoral and inguinal nodes are not enlarged or tender.   Abdomen: There is no mass or tenderness.  He has a nephrostomy tube exiting the left flank draining clear urine.  Skin: Normal skin turgor, no visible rash and no visible skin lesions.   Neuro/Psych: Mood and affect are appropriate.       Assessment  We discussed the management of urinary stones. These options include observation, ureteroscopy, shockwave lithotripsy, and PCNL. We discussed which options are relevant to these particular stones. We discussed the natural history of stones as well as the complications of untreated stones and the impact on quality of life without treatment as well as with each of the above listed treatments. We also discussed the efficacy of each treatment in its ability to clear the stone burden. With any of these management options I discussed the signs and symptoms of infection and the need for emergent treatment should these be experienced. For each option we discussed the ability of each procedure to clear the patient of their stone burden.  For observation I described the risks which include but are not limited to silent renal damage, life-threatening infection, need for emergent surgery, failure to pass stone, and pain.    For ureteroscopy I described the risks which include heart attack, stroke,  pulmonary embolus, death, bleeding, infection, damage to contiguous structures, positioning injury, ureteral stricture, ureteral avulsion, ureteral injury, need for ureteral stent, inability to perform ureteroscopy, need for an interval procedure, inability to clear stone burden, stent discomfort and pain.    For shockwave lithotripsy I described the risks which include arrhythmia, kidney contusion, kidney hemorrhage, need for transfusion, long-term risk of diabetes or hypertension, back discomfort, flank ecchymosis, flank abrasion, inability to break up stone, inability to pass stone fragments, Steinstrasse, infection associated with obstructing stones, need for different surgical procedure and possible need for repeat shockwave lithotripsy.    For PCNL I described the risks including heart attack, sure, pulmonary embolus, death, positioning injury, pneumothorax, hydrothorax, need for chest tube, inability to clear stone burden, renal laceration, arterial venous fistula or malformation, need for embolization of kidney, loss of kidney or renal function, need for repeat procedure, need for prolonged nephrostomy tube, ureteral avulsion and fistula.    Because his left renal pelvic stone is ~3 cm in greatest diameter with other stones in the lower pole calyces as well my recommendation has been to proceed with a PCNL. His stone has Hounsfield units of approximately 950. We discussed the fact that there is a high probability of a second look/staged procedure.  Staghorn renal calculus (592.0) (N20.0)   - Left   Plan He will be scheduled for a left percutaneous nephrolithotomy.

## 2015-04-02 ENCOUNTER — Encounter (HOSPITAL_BASED_OUTPATIENT_CLINIC_OR_DEPARTMENT_OTHER): Payer: Self-pay | Admitting: Urology

## 2015-04-17 ENCOUNTER — Emergency Department (HOSPITAL_COMMUNITY)
Admission: EM | Admit: 2015-04-17 | Discharge: 2015-04-18 | Disposition: A | Discharge status: Home / Self Care | Payer: 59 | Attending: Emergency Medicine | Admitting: Emergency Medicine

## 2015-04-17 ENCOUNTER — Encounter (HOSPITAL_COMMUNITY): Payer: Self-pay | Admitting: *Deleted

## 2015-04-17 DIAGNOSIS — R319 Hematuria, unspecified: Secondary | ICD-10-CM | POA: Insufficient documentation

## 2015-04-17 DIAGNOSIS — Z7951 Long term (current) use of inhaled steroids: Secondary | ICD-10-CM | POA: Diagnosis not present

## 2015-04-17 DIAGNOSIS — Q648 Other specified congenital malformations of urinary system: Secondary | ICD-10-CM | POA: Diagnosis not present

## 2015-04-17 DIAGNOSIS — Z79899 Other long term (current) drug therapy: Secondary | ICD-10-CM | POA: Insufficient documentation

## 2015-04-17 DIAGNOSIS — J449 Chronic obstructive pulmonary disease, unspecified: Secondary | ICD-10-CM | POA: Diagnosis not present

## 2015-04-17 DIAGNOSIS — Z87442 Personal history of urinary calculi: Secondary | ICD-10-CM | POA: Insufficient documentation

## 2015-04-17 DIAGNOSIS — Z87891 Personal history of nicotine dependence: Secondary | ICD-10-CM | POA: Insufficient documentation

## 2015-04-17 DIAGNOSIS — Z9889 Other specified postprocedural states: Secondary | ICD-10-CM | POA: Diagnosis not present

## 2015-04-17 DIAGNOSIS — Z8669 Personal history of other diseases of the nervous system and sense organs: Secondary | ICD-10-CM | POA: Diagnosis not present

## 2015-04-17 NOTE — ED Notes (Signed)
Pt states that he had a blood clot pass through his urethra around 2000pm tonight; pt states that an hour or so later he had bloody urine; pt reports recent Urology procedures but was given the all clear from Urology; pt denies pain or discomfort; pt denies difficulty initiating stream; pt states at the end of the urination he feels like he needs to strain

## 2015-04-18 LAB — CBC WITH DIFFERENTIAL/PLATELET
Basophils Absolute: 0 10*3/uL (ref 0.0–0.1)
Basophils Relative: 0 %
Eosinophils Absolute: 0.6 10*3/uL (ref 0.0–0.7)
Eosinophils Relative: 8 %
HCT: 38.6 % — ABNORMAL LOW (ref 39.0–52.0)
Hemoglobin: 12.5 g/dL — ABNORMAL LOW (ref 13.0–17.0)
Lymphocytes Relative: 22 %
Lymphs Abs: 1.6 10*3/uL (ref 0.7–4.0)
MCH: 30.7 pg (ref 26.0–34.0)
MCHC: 32.4 g/dL (ref 30.0–36.0)
MCV: 94.8 fL (ref 78.0–100.0)
Monocytes Absolute: 1 10*3/uL (ref 0.1–1.0)
Monocytes Relative: 14 %
Neutro Abs: 4.1 10*3/uL (ref 1.7–7.7)
Neutrophils Relative %: 56 %
Platelets: 235 10*3/uL (ref 150–400)
RBC: 4.07 MIL/uL — ABNORMAL LOW (ref 4.22–5.81)
RDW: 13.8 % (ref 11.5–15.5)
WBC: 7.4 10*3/uL (ref 4.0–10.5)

## 2015-04-18 LAB — URINALYSIS, ROUTINE W REFLEX MICROSCOPIC
Glucose, UA: NEGATIVE mg/dL
KETONES UR: 15 mg/dL — AB
NITRITE: POSITIVE — AB
Specific Gravity, Urine: 1.017 (ref 1.005–1.030)
pH: 5.5 (ref 5.0–8.0)

## 2015-04-18 LAB — URINE MICROSCOPIC-ADD ON: BACTERIA UA: NONE SEEN

## 2015-04-18 LAB — BASIC METABOLIC PANEL
Anion gap: 7 (ref 5–15)
BUN: 13 mg/dL (ref 6–20)
CO2: 27 mmol/L (ref 22–32)
Calcium: 8.7 mg/dL — ABNORMAL LOW (ref 8.9–10.3)
Chloride: 107 mmol/L (ref 101–111)
Creatinine, Ser: 1.03 mg/dL (ref 0.61–1.24)
GFR calc Af Amer: >60 mL/min (ref >60)
GFR calc non Af Amer: >60 mL/min (ref >60)
Glucose, Bld: 129 mg/dL — ABNORMAL HIGH (ref 65–99)
Potassium: 3.9 mmol/L (ref 3.5–5.1)
Sodium: 141 mmol/L (ref 135–145)

## 2015-04-18 NOTE — Discharge Instructions (Signed)
Return here as needed.  Follow-up with Dr. Karsten Ro as soon as possible.

## 2015-04-18 NOTE — ED Provider Notes (Signed)
CSN: [id number]     Arrival date & time 04/17/15  2257 History   First MD Initiated Contact with Patient 04/17/15 2359     Chief Complaint  Patient presents with  . Hematuria     (Consider location/radiation/quality/duration/timing/severity/associated sxs/prior Treatment) HPI Patient presents to the emergency department with hematuria that started last night.  The patient states that he passed a clot around 8 PM he states that he later use the bathroom and had bloody urine, states that he recently had multiple urological procedures done the last being on December 2.  Patient states that nothing seems condition better or worse.  Patient has no pain associated with this.  Patient states that he does not have any difficulty urinating, states that during the time he went to the bathroom, he felt like he had to be more than normal, but again no pain.  Patient denies chest pain, shortness of breath, weakness, dizziness, headache, blurred vision, back pain, neck pain, fever, dysuria, incontinence, abdominal pain, or syncope.  Patient states that he did not take any medications prior to arrival.  He states he has not had any problems since his last procedure Past Medical History  Diagnosis Date  . Renal calculus, left   . History of kidney stones   . Renal cyst, right     SIMPLE  . OSA (obstructive sleep apnea)     s/p  surgery 1996  . COPD, mild [facility])    Past Surgical History  Procedure Laterality Date  . Nephrolithotomy Left 03/20/2015    Procedure: NEPHROLITHOTOMY PERCUTANEOUS;  Surgeon: Kathie Rhodes, MD;  Location: [facility];  Service: Urology;  Laterality: Left;  . Cysto/  right ureteroscopy/  stent placement  10/ 2001  . Uvulopalatopharyngoplasty  1996    nasal surgery  . Cystoscopy/ureteroscopy/holmium laser/stent placement Left 03/30/2015    Procedure: LEFT URETEROSCOPY/HOLMIUM LASER LITHOTRIPSY/STENT PLACEMENT, WITH  STONE BASKET EXTRACTION;  Surgeon: Kathie Rhodes, MD;  Location: [facility];  Service: Urology;  Laterality: Left;  . Holmium laser application Left [id number]    Procedure: HOLMIUM LASER APPLICATION;  Surgeon: Kathie Rhodes, MD;  Location: [facility];  Service: Urology;  Laterality: Left;   Family History  Problem Relation Age of Onset  . Cancer Other   . Obesity Other   . Cancer Mother   . Heart disease Father    Social History  Substance Use Topics  . Smoking status: Former Smoker -- 1.00 packs/day for 10 years    Types: Cigarettes    Quit date: 03/28/1987  . Smokeless tobacco: Never Used  . Alcohol Use: No    Review of Systems All other systems negative except as documented in the HPI. All pertinent positives and negatives as reviewed in the HPI.   Allergies  Review of patient's allergies indicates no known allergies.  Home Medications   Prior to Admission medications   Medication Sig Start Date End Date Taking? Authorizing Provider  budesonide-formoterol (SYMBICORT) 80-4.5 MCG/ACT inhaler Inhale 2 puffs into the lungs 2 (two) times daily. 08/09/14  Yes Chelle Jeffery, PA-C  ibuprofen (ADVIL,MOTRIN) 200 MG tablet Take 200-600 mg by mouth every 6 (six) hours as needed (pain).   Yes Historical Provider, MD  phenazopyridine (PYRIDIUM) 200 MG tablet Take 1 tablet (200 mg total) by mouth 3 (three) times daily as needed for pain. 03/30/15  Yes Kathie Rhodes, MD  tamsulosin (FLOMAX) 0.4 MG CAPS capsule Take 1 capsule (0.4 mg total) by mouth daily. 03/23/15  Yes  Elby Beck, FNP  albuterol (PROVENTIL HFA;VENTOLIN HFA) 108 (90 BASE) MCG/ACT inhaler Inhale 2 puffs into the lungs every 6 (six) hours as needed for wheezing or shortness of breath. 07/29/14   Darreld Mclean, MD  doxycycline (VIBRAMYCIN) 50 MG capsule Take 1 capsule (50 mg total) by mouth daily. Patient not taking: Reported on 04/17/2015 03/21/15   Kathie Rhodes, MD  Oxycodone HCl 10 MG TABS Take 1 tablet (10 mg total) by mouth every 4 (four) hours as  needed. Patient not taking: Reported on 04/17/2015 03/21/15   Kathie Rhodes, MD  Oxycodone HCl 10 MG TABS Take 1 tablet (10 mg total) by mouth every 4 (four) hours as needed. 03/30/15   Kathie Rhodes, MD   BP 118/70 mmHg  Pulse 62  Temp(Src) 98.2 F (36.8 C) (Oral)  Resp 18  Ht 5\' 10"  (1.778 m)  Wt 124.739 kg  BMI 39.46 kg/m2  SpO2 99% Physical Exam  Constitutional: He is oriented to person, place, and time. He appears well-developed and well-nourished. No distress.  HENT:  Head: Normocephalic and atraumatic.  Mouth/Throat: Oropharynx is clear and moist.  Eyes: Pupils are equal, round, and reactive to light.  Neck: Normal range of motion. Neck supple.  Cardiovascular: Normal rate, regular rhythm and normal heart sounds.  Exam reveals no gallop and no friction rub.   No murmur heard. Pulmonary/Chest: Effort normal and breath sounds normal. No respiratory distress. He has no wheezes.  Abdominal: Soft. Bowel sounds are normal. He exhibits no distension. There is no tenderness.  Neurological: He is alert and oriented to person, place, and time. He exhibits normal muscle tone. Coordination normal.  Skin: Skin is warm and dry. No rash noted. No erythema.  Psychiatric: He has a normal mood and affect. His behavior is normal.  Nursing note and vitals reviewed.   ED Course  Procedures (including critical care time) Labs Review Labs Reviewed  URINALYSIS, ROUTINE W REFLEX MICROSCOPIC (NOT AT [facility]) - Abnormal; Notable for the following:    Color, Urine RED (*)    APPearance CLOUDY (*)    Hgb urine dipstick LARGE (*)    Bilirubin Urine LARGE (*)    Ketones, ur 15 (*)    Protein, ur >300 (*)    Nitrite POSITIVE (*)    Leukocytes, UA MODERATE (*)    All other components within normal limits  BASIC METABOLIC PANEL - Abnormal; Notable for the following:    Glucose, Bld 129 (*)    Calcium 8.7 (*)    All other components within normal limits  CBC WITH DIFFERENTIAL/PLATELET - Abnormal;  Notable for the following:    RBC 4.07 (*)    Hemoglobin 12.5 (*)    HCT 38.6 (*)    All other components within normal limits  URINE MICROSCOPIC-ADD ON - Abnormal; Notable for the following:    Squamous Epithelial / LPF 0-5 (*)    All other components within normal limits    Imaging Review No results found. I have personally reviewed and evaluated these images and lab results as part of my medical decision-making.  I do not have a clear indication of why the patient has hematuria at this time, we will have him follow-up with his urologist as soon as possible.  Patient is advised return here as needed.  Patient's laboratory testing does not show any significant abnormalities.  I advised the patient the plan and all questions were answered  Dalia Heading, PA-C 04/18/15 0149  Rolland Porter, MD 04/18/15  0200 

## 2015-04-20 LAB — URINE CULTURE: Culture: >=100000

## 2015-04-21 ENCOUNTER — Telehealth (HOSPITAL_COMMUNITY): Payer: Self-pay

## 2015-04-21 NOTE — Telephone Encounter (Signed)
Post ED Visit - Positive Culture Follow-up: Successful Patient Follow-Up  Culture assessed and recommendations reviewed by: []  Elenor Quinones, Pharm.D. []  Heide Guile, Pharm.D., BCPS []  Parks Neptune, Pharm.D. []  Alycia Rossetti, Pharm.D., BCPS []  Carnot-Moon, Pharm.D., BCPS, AAHIVP []  Legrand Como, Pharm.D., BCPS, AAHIVP []  Cassie Stewart, Pharm.D. []  Stephens November, Pharm.DMadilyn Fireman pharm d Positive urine culture  [x]  Patient discharged without antimicrobial prescription and treatment is now indicated []  Organism is resistant to prescribed ED discharge antimicrobial []  Patient with positive blood cultures  Changes discussed with ED provider: Arlean Hopping New antibiotic prescription macrobid 100 mg po bid x 5 days  Pt informed. Med called to [facility] pharmacy on [address]. Left on physician voicemail.  Ileene Musa 04/21/2015, 10:14 AM

## 2015-05-21 DIAGNOSIS — N200 Calculus of kidney: Secondary | ICD-10-CM | POA: Diagnosis not present

## 2015-05-21 DIAGNOSIS — Z0000 Encounter for general adult medical examination without abnormal findings: Secondary | ICD-10-CM | POA: Diagnosis not present

## 2015-05-21 DIAGNOSIS — N390 Urinary tract infection, site not specified: Secondary | ICD-10-CM | POA: Diagnosis not present

## 2015-05-21 DIAGNOSIS — N139 Obstructive and reflux uropathy, unspecified: Secondary | ICD-10-CM | POA: Diagnosis not present

## 2015-05-21 DIAGNOSIS — N281 Cyst of kidney, acquired: Secondary | ICD-10-CM | POA: Diagnosis not present

## 2015-05-23 MED FILL — TAMSULOSIN HCL 0.4 MG CAP: 0.4 | 30 days supply | Qty: 30 | Fill #1

## 2015-05-24 MED FILL — SYMBICORT 80-4.5 MCG INH: 80-4.5 | 30 days supply | Qty: 10 | Fill #3

## 2015-06-11 ENCOUNTER — Ambulatory Visit (INDEPENDENT_AMBULATORY_CARE_PROVIDER_SITE_OTHER): Payer: 59 | Admitting: Family Medicine

## 2015-06-11 VITALS — BP 132/84 | HR 102 | Temp 99.6°F | Resp 20 | Ht 70.0 in | Wt 279.2 lb

## 2015-06-11 DIAGNOSIS — R600 Localized edema: Secondary | ICD-10-CM

## 2015-06-11 DIAGNOSIS — J0100 Acute maxillary sinusitis, unspecified: Secondary | ICD-10-CM | POA: Diagnosis not present

## 2015-06-11 DIAGNOSIS — L98491 Non-pressure chronic ulcer of skin of other sites limited to breakdown of skin: Secondary | ICD-10-CM

## 2015-06-11 MED ORDER — FUROSEMIDE 40 MG PO TABS: 40.0000 mg | ORAL_TABLET | Freq: Every day | ORAL | Status: DC

## 2015-06-11 MED ORDER — AMOXICILLIN-POT CLAVULANATE 875-125 MG PO TABS: 1.0000 | ORAL_TABLET | Freq: Two times a day (BID) | ORAL | Status: DC

## 2015-06-11 MED FILL — AMOX TR-K CLV 875-125 MG TA: 875-125 | 10 days supply | Qty: 20 | Fill #0

## 2015-06-11 MED FILL — FUROSEMIDE 40 MG TABLET: 40 | 90 days supply | Qty: 90 | Fill #0

## 2015-06-11 NOTE — Progress Notes (Signed)
   Subjective:    [name], male    DOB: 1955/12/02, 60 y.o.   MRN: [id number] By signing my name below, I, Zola Button, attest that this documentation has been prepared under the direction and in the presence of Robyn Haber, MD.  Electronically Signed: Zola Button, Medical Scribe. 06/11/2015. 8:15 AM.  HPI HPI Comments: Zachary Jacobs is a 60 y.o. male who presents to the [facility] complaining of an ulcer to his right lower leg that started about a week ago. He has had this intermittently for the past 3 years. He is not currently taking doxycycline.  Patient has also had a cough with sinus symptoms for the past week.  Patient was recently seen in the ED 2 months ago and also had surgery for a kidney stone.  Review of Systems  HENT: Positive for congestion.   Respiratory: Positive for cough.   Skin: Positive for wound.       Objective:   Physical Exam CONSTITUTIONAL: Well developed/well nourished HEAD: Normocephalic/atraumatic EYES: EOM/PERRL ENMT: Mucous membranes moist; mucopurulent discharge both nasal passages. Post uvulectomy. NECK: supple no meningeal signs SPINE: entire spine nontender CV: S1/S2 noted, no murmurs/rubs/gallops noted LUNGS: Expiratory wheezes GU: no cva tenderness NEURO: Pt is awake/alert, moves all extremitiesx4 EXTREMITIES: pulses normal, full ROM SKIN: warm, color normal; 1 cm superficial shin ulceration on the right with surrounding chronic violaceous skin color change. 2+ pedal edema bilaterally PSYCH: no abnormalities of mood noted        Assessment & Plan:   This chart was scribed in my presence and reviewed by me personally.    ICD-9-CM ICD-10-CM   1. Pedal edema 782.3 R60.0 furosemide (LASIX) 40 MG tablet  2. Skin ulcer, limited to breakdown of skin (HCC) 707.9 L98.491 amoxicillin-clavulanate (AUGMENTIN) 875-125 MG tablet  3. Acute maxillary sinusitis, recurrence not specified 461.0 J01.00  amoxicillin-clavulanate (AUGMENTIN) 875-125 MG tablet     Signed, Robyn Haber, MD

## 2015-06-11 NOTE — Patient Instructions (Signed)
Elastics therapy phone number: [phone]  Take the Lasix daily until the majority of the swelling is gone and the leg. Then you can take it as needed in the future. Keep the leg elevated as much as possible.

## 2015-07-05 MED FILL — TAMSULOSIN HCL 0.4 MG CAP: 0.4 | 30 days supply | Qty: 30 | Fill #2

## 2015-07-19 MED FILL — SYMBICORT 80-4.5 MCG INH: 80-4.5 | 30 days supply | Qty: 10 | Fill #4

## 2015-08-07 MED FILL — TAMSULOSIN HCL 0.4 MG CAP: 0.4 | 30 days supply | Qty: 30 | Fill #3

## 2015-08-20 DIAGNOSIS — N139 Obstructive and reflux uropathy, unspecified: Secondary | ICD-10-CM | POA: Diagnosis not present

## 2015-08-20 DIAGNOSIS — Z0000 Encounter for general adult medical examination without abnormal findings: Secondary | ICD-10-CM | POA: Diagnosis not present

## 2015-08-20 DIAGNOSIS — Z125 Encounter for screening for malignant neoplasm of prostate: Secondary | ICD-10-CM | POA: Diagnosis not present

## 2015-08-20 DIAGNOSIS — R338 Other retention of urine: Secondary | ICD-10-CM | POA: Diagnosis not present

## 2015-08-20 DIAGNOSIS — N401 Enlarged prostate with lower urinary tract symptoms: Secondary | ICD-10-CM | POA: Diagnosis not present

## 2015-08-23 ENCOUNTER — Other Ambulatory Visit: Payer: Self-pay

## 2015-08-23 NOTE — Patient Outreach (Signed)
New referral for high risk screening: Reviewed chart. Placed call to patient. No answer. Voice mail not set up.  PLAN: Will continue outreach attempts.  Tomasa Rand, RN, BSN, CEN [facility] [employer] [phone]

## 2015-08-24 ENCOUNTER — Other Ambulatory Visit: Payer: Self-pay

## 2015-08-24 NOTE — Patient Outreach (Signed)
UMR screening: Placed 2nd call to patient without an answer. No voice mail set up.  PLAN: Will Corporate treasurer and attempt again.  Tomasa Rand, RN, BSN, CEN [facility] [employer] [phone]

## 2015-08-29 MED FILL — TAMSULOSIN HCL 0.4 MG CAP: 0.4 | 90 days supply | Qty: 180 | Fill #0

## 2015-09-03 ENCOUNTER — Other Ambulatory Visit: Payer: Self-pay

## 2015-09-03 NOTE — Patient Outreach (Signed)
UMR screening: Placed call to patient for high cost UMR screening. Patient identified himself. I explained purpose of call. Patient reports that he is doing well and has no concerns at this time.  Patient denies any needs.  Reminded. patient of UMR benefit of [facility] care management.  Also discussed outpatient pharmacy as a benefit. Patient admits that he is using [facility] health outpatient pharmacy.  PLAN: Will close case as patient denies needs. Will notify [facility].  Tomasa Rand, RN, BSN, CEN [facility] [employer] [phone]

## 2015-09-28 ENCOUNTER — Other Ambulatory Visit: Payer: Self-pay | Admitting: Physician Assistant

## 2015-11-20 DIAGNOSIS — Z87442 Personal history of urinary calculi: Secondary | ICD-10-CM | POA: Diagnosis not present

## 2015-11-20 DIAGNOSIS — R3914 Feeling of incomplete bladder emptying: Secondary | ICD-10-CM | POA: Diagnosis not present

## 2015-11-23 ENCOUNTER — Ambulatory Visit (INDEPENDENT_AMBULATORY_CARE_PROVIDER_SITE_OTHER): Payer: 59 | Admitting: Urgent Care

## 2015-11-23 VITALS — BP 130/80 | HR 77 | Temp 98.3°F | Resp 17 | Ht 69.5 in | Wt 289.0 lb

## 2015-11-23 DIAGNOSIS — J449 Chronic obstructive pulmonary disease, unspecified: Secondary | ICD-10-CM | POA: Diagnosis not present

## 2015-11-23 MED ORDER — BUDESONIDE-FORMOTEROL FUMARATE 80-4.5 MCG/ACT IN AERO: 2.0000 | INHALATION_SPRAY | Freq: Two times a day (BID) | RESPIRATORY_TRACT | 11 refills | Status: DC

## 2015-11-23 MED FILL — SYMBICORT 80-4.5 MCG INH: 80-4.5 | 30 days supply | Qty: 10 | Fill #0

## 2015-11-23 NOTE — Progress Notes (Signed)
    MRN: [id number] DOB: 1956/03/07  Subjective:   Zachary Jacobs is a 60 y.o. male presenting for follow up on COPD.   Patient is currently managed with Symbicort, needs a medication refill. Uses Symbicort twice daily. Patient works as a Statistician. Denies fever, COPD exacerbations, shob, chest pain, chest tightness. Denies smoking cigarettes, quit 1980's.  [name] has a current medication list which includes the following prescription(s): albuterol, furosemide, phenazopyridine, symbicort, and oxycodone hcl. Also has No Known Allergies.  Edmund  has a past medical history of COPD, mild ([place]); History of kidney stones; OSA (obstructive sleep apnea); Renal calculus, left; and Renal cyst, right. Also  has a past surgical history that includes Nephrolithotomy (Left, 03/20/2015); CYSTO/  RIGHT URETEROSCOPY/  STENT PLACEMENT (10/ 2001); Uvulopalatopharyngoplasty (1996); Cystoscopy/ureteroscopy/holmium laser/stent placement (Left, 03/30/2015); and Holmium laser application (Left, [id number]).  Objective:   Vitals: BP 130/80 (BP Location: Right Arm, Patient Position: Sitting, Cuff Size: Large)   Pulse 77   Temp 98.3 F (36.8 C) (Oral)   Resp 17   Ht 5' 9.5" (1.765 m)   Wt 289 lb (131.1 kg)   SpO2 96%   BMI 42.07 kg/m   Physical Exam  Constitutional: He is oriented to person, place, and time. He appears well-developed and well-nourished.  Cardiovascular: Normal rate, regular rhythm and intact distal pulses.  Exam reveals no gallop and no friction rub.   No murmur heard. Pulmonary/Chest: No respiratory distress. He has no wheezes. He has no rales.  Neurological: He is alert and oriented to person, place, and time.     Assessment and Plan :   1. Chronic obstructive pulmonary disease, unspecified COPD type ([place]) - Stable, refilled Symbicort. F/u in 6 months to 1 year.   Jaynee Eagles, PA-C [facility] [phone] 11/23/2015 1:56  PM

## 2015-11-23 NOTE — Patient Instructions (Addendum)
Chronic Obstructive Pulmonary Disease Chronic obstructive pulmonary disease (COPD) is a common lung condition in which airflow from the lungs is limited. COPD is a general term that can be used to describe many different lung problems that limit airflow, including both chronic bronchitis and emphysema. If you have COPD, your lung function will probably never return to normal, but there are measures you can take to improve lung function and make yourself feel better. CAUSES   Smoking (common).  Exposure to secondhand smoke.  Genetic problems.  Chronic inflammatory lung diseases or recurrent infections. SYMPTOMS  Shortness of breath, especially with physical activity.  Deep, persistent (chronic) cough with a large amount of thick mucus.  Wheezing.  Rapid breaths (tachypnea).  Gray or bluish discoloration (cyanosis) of the skin, especially in your fingers, toes, or lips.  Fatigue.  Weight loss.  Frequent infections or episodes when breathing symptoms become much worse (exacerbations).  Chest tightness. DIAGNOSIS Your health care provider will take a medical history and perform a physical examination to diagnose COPD. Additional tests for COPD may include:  Lung (pulmonary) function tests.  Chest X-ray.  CT scan.  Blood tests. TREATMENT  Treatment for COPD may include:  Inhaler and nebulizer medicines. These help manage the symptoms of COPD and make your breathing more comfortable.  Supplemental oxygen. Supplemental oxygen is only helpful if you have a low oxygen level in your blood.  Exercise and physical activity. These are beneficial for nearly all people with COPD.  Lung surgery or transplant.  Nutrition therapy to gain weight, if you are underweight.  Pulmonary rehabilitation. This may involve working with a team of health care providers and specialists, such as respiratory, occupational, and physical therapists. HOME CARE INSTRUCTIONS  Take all medicines  (inhaled or pills) as directed by your health care provider.  Avoid over-the-counter medicines or cough syrups that dry up your airway (such as antihistamines) and slow down the elimination of secretions unless instructed otherwise by your health care provider.  If you are a smoker, the most important thing that you can do is stop smoking. Continuing to smoke will cause further lung damage and breathing trouble. Ask your health care provider for help with quitting smoking. He or she can direct you to community resources or hospitals that provide support.  Avoid exposure to irritants such as smoke, chemicals, and fumes that aggravate your breathing.  Use oxygen therapy and pulmonary rehabilitation if directed by your health care provider. If you require home oxygen therapy, ask your health care provider whether you should purchase a pulse oximeter to measure your oxygen level at home.  Avoid contact with individuals who have a contagious illness.  Avoid extreme temperature and humidity changes.  Eat healthy foods. Eating smaller, more frequent meals and resting before meals may help you maintain your strength.  Stay active, but balance activity with periods of rest. Exercise and physical activity will help you maintain your ability to do things you want to do.  Preventing infection and hospitalization is very important when you have COPD. Make sure to receive all the vaccines your health care provider recommends, especially the pneumococcal and influenza vaccines. Ask your health care provider whether you need a pneumonia vaccine.  Learn and use relaxation techniques to manage stress.  Learn and use controlled breathing techniques as directed by your health care provider. Controlled breathing techniques include:  Pursed lip breathing. Start by breathing in (inhaling) through your nose for 1 second. Then, purse your lips as if you were   going to whistle and breathe out (exhale) through the  pursed lips for 2 seconds.  Diaphragmatic breathing. Start by putting one hand on your abdomen just above your waist. Inhale slowly through your nose. The hand on your abdomen should move out. Then purse your lips and exhale slowly. You should be able to feel the hand on your abdomen moving in as you exhale.  Learn and use controlled coughing to clear mucus from your lungs. Controlled coughing is a series of short, progressive coughs. The steps of controlled coughing are: 1. Lean your head slightly forward. 2. Breathe in deeply using diaphragmatic breathing. 3. Try to hold your breath for 3 seconds. 4. Keep your mouth slightly open while coughing twice. 5. Spit any mucus out into a tissue. 6. Rest and repeat the steps once or twice as needed. SEEK MEDICAL CARE IF:  You are coughing up more mucus than usual.  There is a change in the color or thickness of your mucus.  Your breathing is more labored than usual.  Your breathing is faster than usual. SEEK IMMEDIATE MEDICAL CARE IF:  You have shortness of breath while you are resting.  You have shortness of breath that prevents you from:  Being able to talk.  Performing your usual physical activities.  You have chest pain lasting longer than 5 minutes.  Your skin color is more cyanotic than usual.  You measure low oxygen saturations for longer than 5 minutes with a pulse oximeter. MAKE SURE YOU:  Understand these instructions.  Will watch your condition.  Will get help right away if you are not doing well or get worse.   This information is not intended to replace advice given to you by your health care provider. Make sure you discuss any questions you have with your health care provider.   Document Released: 01/22/2005 Document Revised: 05/05/2014 Document Reviewed: 12/09/2012 Elsevier Interactive Patient Education [employer].     IF you received an x-ray today, you will receive an invoice from [facility]  Radiology. Please contact [facility] Radiology at [phone] with questions or concerns regarding your invoice.   IF you received labwork today, you will receive an invoice from [employer]. Please contact [facility] at [phone] with questions or concerns regarding your invoice.   Our billing staff will not be able to assist you with questions regarding bills from these companies.  You will be contacted with the lab results as soon as they are available. The fastest way to get your results is to activate your My Chart account. Instructions are located on the last page of this paperwork. If you have not heard from [place] regarding the results in 2 weeks, please contact this office.

## 2015-12-25 MED FILL — TAMSULOSIN HCL 0.4 MG CAP: 0.4 | 30 days supply | Qty: 60 | Fill #1

## 2016-02-20 MED FILL — SYMBICORT 80-4.5 MCG INH: 80-4.5 | 30 days supply | Qty: 10 | Fill #0

## 2016-02-20 MED FILL — TAMSULOSIN HCL 0.4 MG CAP: 0.4 | 30 days supply | Qty: 60 | Fill #2

## 2016-02-27 ENCOUNTER — Ambulatory Visit (INDEPENDENT_AMBULATORY_CARE_PROVIDER_SITE_OTHER): Payer: 59 | Admitting: Family Medicine

## 2016-02-27 VITALS — BP 138/68 | HR 78 | Temp 98.5°F | Resp 18 | Ht 69.5 in | Wt 283.0 lb

## 2016-02-27 DIAGNOSIS — J329 Chronic sinusitis, unspecified: Secondary | ICD-10-CM | POA: Diagnosis not present

## 2016-02-27 DIAGNOSIS — R05 Cough: Secondary | ICD-10-CM | POA: Diagnosis not present

## 2016-02-27 DIAGNOSIS — R059 Cough, unspecified: Secondary | ICD-10-CM

## 2016-02-27 MED ORDER — IPRATROPIUM BROMIDE 0.03 % NA SOLN: 2.0000 | Freq: Two times a day (BID) | NASAL | 0 refills | Status: DC

## 2016-02-27 MED ORDER — AMOXICILLIN-POT CLAVULANATE 875-125 MG PO TABS: 1.0000 | ORAL_TABLET | Freq: Two times a day (BID) | ORAL | 0 refills | Status: DC

## 2016-02-27 MED FILL — AMOX-CLAV 875-125 MG TABLET: 875-125 | 5 days supply | Qty: 20 | Fill #0

## 2016-02-27 MED FILL — IPRATROPIUM 0.03% SPRAY: 0.03 | 30 days supply | Qty: 30 | Fill #0

## 2016-02-27 NOTE — Patient Instructions (Addendum)
Start Augmentin 875-125 mg twice daily.  Use Atrovent nasal spray two sprays twice daily for nasal congestion.  Follow-up as needed.  Zachary Jacobs. Kenton Kingfisher, MSN, FNP-C [facility]  IF you received an x-ray today, you will receive an invoice from [facility] Radiology. Please contact [facility] Radiology at [phone] with questions or concerns regarding your invoice.   IF you received labwork today, you will receive an invoice from [employer]. Please contact [facility] at [phone] with questions or concerns regarding your invoice.   Our billing staff will not be able to assist you with questions regarding bills from these companies.  You will be contacted with the lab results as soon as they are available. The fastest way to get your results is to activate your My Chart account. Instructions are located on the last page of this paperwork. If you have not heard from [place] regarding the results in 2 weeks, please contact this office.     Sinusitis, Adult Sinusitis is redness, soreness, and puffiness (inflammation) of the air pockets in the bones of your face (sinuses). The redness, soreness, and puffiness can cause air and mucus to get trapped in your sinuses. This can allow germs to grow and cause an infection.  HOME CARE   Drink enough fluids to keep your pee (urine) clear or pale yellow.  Use a humidifier in your home.  Run a hot shower to create steam in the bathroom. Sit in the bathroom with the door closed. Breathe in the steam 3-4 times a day.  Put a warm, moist washcloth on your face 3-4 times a day, or as told by your doctor.  Use salt water sprays (saline sprays) to wet the thick fluid in your nose. This can help the sinuses drain.  Only take medicine as told by your doctor. GET HELP RIGHT AWAY IF:   Your pain gets worse.  You have very bad headaches.  You are sick to your stomach (nauseous).  You  throw up (vomit).  You are very sleepy (drowsy) all the time.  Your face is puffy (swollen).  Your vision changes.  You have a stiff neck.  You have trouble breathing. MAKE SURE YOU:   Understand these instructions.  Will watch your condition.  Will get help right away if you are not doing well or get worse.   This information is not intended to replace advice given to you by your health care provider. Make sure you discuss any questions you have with your health care provider.   Document Released: 10/01/2007 Document Revised: 05/05/2014 Document Reviewed: 11/18/2011 Elsevier Interactive Patient Education [employer].

## 2016-02-27 NOTE — Progress Notes (Signed)
   [name], male    DOB: 1956/04/02, 60 y.o.   MRN: [id number]  PCP: No PCP Per Patient  Chief Complaint  Patient presents with  . Cough    Productive  . Nasal Congestion  . Sinusitis    Subjective:   HPI 60 year old male,presents for evaluation of productive cough and nasal congestion times 7 days. Describes yellow-green-white phlegm produced with cough. He has had some shortness of breath and mild wheezing. Complains of sinus pressure without pain. Denies fever, nausea, or sore throat. Fatigue times 2 days. He has taken any medication for symptoms.   Review of Systems See HPI  Patient Active Problem List   Diagnosis Date Noted  . Staghorn renal calculus 03/20/2015  . Hydronephrosis      Prior to Admission medications   Medication Sig Start Date End Date Taking? Authorizing Provider  albuterol (PROVENTIL HFA;VENTOLIN HFA) 108 (90 BASE) MCG/ACT inhaler Inhale 2 puffs into the lungs every 6 (six) hours as needed for wheezing or shortness of breath. 07/29/14  Yes Gay Filler Copland, MD  budesonide-formoterol (SYMBICORT) 80-4.5 MCG/ACT inhaler Inhale 2 puffs into the lungs 2 (two) times Zachary. 11/23/15  Yes Jaynee Eagles, PA-C  furosemide (LASIX) 40 MG tablet Take 1 tablet (40 mg total) by mouth Zachary. 06/11/15  Yes Robyn Haber, MD  Tamsulosin HCl (FLOMAX PO) Take by mouth 2 (two) times Zachary.   Yes Historical Provider, MD   No Known Allergies     Objective:  Physical Exam  Constitutional: He is oriented to person, place, and time. He appears well-developed and well-nourished.  HENT:  Head: Normocephalic and atraumatic.  Right Ear: Tympanic membrane, external ear and ear canal normal.  Left Ear: Tympanic membrane, external ear and ear canal normal.  Nose: Mucosal edema present. Right sinus exhibits no maxillary sinus tenderness and no frontal sinus tenderness. Left sinus exhibits no maxillary sinus tenderness and no frontal sinus tenderness.    Mouth/Throat: Oropharynx is clear and moist. No oropharyngeal exudate, posterior oropharyngeal edema or posterior oropharyngeal erythema.  Eyes: Conjunctivae and EOM are normal.  Cardiovascular: Normal rate, regular rhythm, normal heart sounds and intact distal pulses.   Pulmonary/Chest: Effort normal and breath sounds normal.  Musculoskeletal: Normal range of motion.  Neurological: He is alert and oriented to person, place, and time.  Skin: Skin is warm and dry.  Psychiatric: He has a normal mood and affect. His behavior is normal. Judgment and thought content normal.       Vitals:   02/27/16 1007  BP: 138/68  Pulse: 78  Resp: 18  Temp: 98.5 F (36.9 C)   Assessment & Plan:  1. Sinusitis, unspecified chronicity, unspecified location 2. Cough  Plan: . amoxicillin-clavulanate (AUGMENTIN) 875-125 MG tablet    Sig: Take 1 tablet by mouth 2 (two) times Zachary.  Marland Kitchen ipratropium (ATROVENT) 0.03 % nasal spray    Sig: Place 2 sprays into both nostrils 2 (two) times Zachary.   Return for follow-up as needed.  Carroll Sage. Kenton Kingfisher, MSN, FNP-C [facility]

## 2016-04-24 MED FILL — TAMSULOSIN HCL 0.4 MG CAP: 0.4 | 30 days supply | Qty: 60 | Fill #3

## 2016-04-24 MED FILL — SYMBICORT 80-4.5 MCG INH: 80-4.5 | 30 days supply | Qty: 10 | Fill #1

## 2016-06-17 MED FILL — TAMSULOSIN HCL 0.4 MG CAP: 0.4 | 30 days supply | Qty: 60 | Fill #4

## 2016-06-18 MED FILL — SYMBICORT 80-4.5 MCG INH: 80-4.5 | 30 days supply | Qty: 10 | Fill #2

## 2016-07-23 ENCOUNTER — Ambulatory Visit (INDEPENDENT_AMBULATORY_CARE_PROVIDER_SITE_OTHER): Payer: 59 | Admitting: Physician Assistant

## 2016-07-23 VITALS — BP 128/81 | HR 73 | Temp 98.7°F | Resp 18 | Ht 69.5 in | Wt 291.0 lb

## 2016-07-23 DIAGNOSIS — J441 Chronic obstructive pulmonary disease with (acute) exacerbation: Secondary | ICD-10-CM | POA: Diagnosis not present

## 2016-07-23 DIAGNOSIS — J449 Chronic obstructive pulmonary disease, unspecified: Secondary | ICD-10-CM | POA: Diagnosis not present

## 2016-07-23 DIAGNOSIS — R05 Cough: Secondary | ICD-10-CM

## 2016-07-23 DIAGNOSIS — R059 Cough, unspecified: Secondary | ICD-10-CM

## 2016-07-23 DIAGNOSIS — R0602 Shortness of breath: Secondary | ICD-10-CM

## 2016-07-23 MED ORDER — PREDNISONE 10 MG PO TABS: ORAL_TABLET | ORAL | 0 refills | Status: AC

## 2016-07-23 MED ORDER — GUAIFENESIN ER 1200 MG PO TB12: 1.0000 | ORAL_TABLET | Freq: Two times a day (BID) | ORAL | 0 refills | Status: AC

## 2016-07-23 MED ORDER — AMOXICILLIN-POT CLAVULANATE 875-125 MG PO TABS: 1.0000 | ORAL_TABLET | Freq: Two times a day (BID) | ORAL | 0 refills | Status: DC

## 2016-07-23 MED ORDER — HYDROCODONE-HOMATROPINE 5-1.5 MG/5ML PO SYRP: 5.0000 mL | ORAL_SOLUTION | Freq: Three times a day (TID) | ORAL | 0 refills | Status: DC | PRN

## 2016-07-23 MED FILL — HYDROCODONE-HOMATROPINE SYR: 5-1.5 | 8 days supply | Qty: 120 | Fill #0

## 2016-07-23 MED FILL — AMOX-CLAV 875-125 MG TABLET: 875-125 | 10 days supply | Qty: 20 | Fill #0

## 2016-07-23 MED FILL — predniSONE 10 MG TABS: 10 | 6 days supply | Qty: 21 | Fill #0

## 2016-07-23 NOTE — Progress Notes (Signed)
Zachary Jacobs  MRN: [id number] DOB: 1955/09/14  PCP: No PCP Per Patient  Chief Complaint  Patient presents with  . Cough    WHEEZING  . Sore Throat  . Shortness of Breath    Subjective:  Pt presents to clinic for cold symptoms for abut 1-2 weeks.  The sore throat started yesterday but is slightly better today.  He has had problems with his asthma/COPD.  He has used his albuterol more in the last week than he has in the last year.  He has used shower to help with mucus production.  Cough is productive clear and yellow and more thick than normal. He has used some halls but not any other OTC meds.  Granddaughter with strep at home.  Quit smoking 1985  Review of Systems  Constitutional: Positive for fever (maybe low grade). Negative for chills.  HENT: Positive for congestion, postnasal drip and sore throat.   Respiratory: Positive for cough, shortness of breath and wheezing.   Gastrointestinal: Positive for diarrhea (loose stools). Negative for nausea.  Musculoskeletal: Positive for myalgias.  Neurological: Negative for headaches.    Patient Active Problem List   Diagnosis Date Noted  . Staghorn renal calculus 03/20/2015  . Hydronephrosis     Current Outpatient Prescriptions on File Prior to Visit  Medication Sig Dispense Refill  . albuterol (PROVENTIL HFA;VENTOLIN HFA) 108 (90 BASE) MCG/ACT inhaler Inhale 2 puffs into the lungs every 6 (six) hours as needed for wheezing or shortness of breath. 1 Inhaler 6  . budesonide-formoterol (SYMBICORT) 80-4.5 MCG/ACT inhaler Inhale 2 puffs into the lungs 2 (two) times daily. 10.2 g 11  . Tamsulosin HCl (FLOMAX PO) Take by mouth 2 (two) times daily.    . furosemide (LASIX) 40 MG tablet Take 1 tablet (40 mg total) by mouth daily. (Patient not taking: Reported on 07/23/2016) 30 tablet 3  . ipratropium (ATROVENT) 0.03 % nasal spray Place 2 sprays into both nostrils 2 (two) times daily. (Patient not taking: Reported on 07/23/2016) 30 mL 0     No current facility-administered medications on file prior to visit.     No Known Allergies  Pt patients past, family and social history were reviewed and updated.   Objective:  BP 128/81   Pulse 73   Temp 98.7 F (37.1 C) (Oral)   Resp 18   Ht 5' 9.5" (1.765 m)   Wt 291 lb (132 kg)   SpO2 94%   BMI 42.36 kg/m   Physical Exam  Constitutional: He is oriented to person, place, and time and well-developed, well-nourished, and in no distress.  HENT:  Head: Normocephalic and atraumatic.  Right Ear: Hearing, tympanic membrane, external ear and ear canal normal.  Left Ear: Hearing, tympanic membrane, external ear and ear canal normal.  Nose: Mucosal edema (red) and rhinorrhea (clear) present.  Mouth/Throat: Uvula is midline, oropharynx is clear and moist and mucous membranes are normal.  Eyes: Conjunctivae are normal.  Neck: Normal range of motion.  Cardiovascular: Normal rate, regular rhythm and normal heart sounds.   Pulmonary/Chest: Effort normal and breath sounds normal. He has no wheezes.  Expiratory phase longer than inspiratory phase  Lymphadenopathy:       Head (right side): No tonsillar adenopathy present.       Head (left side): No tonsillar adenopathy present.    He has no cervical adenopathy.       Right: No supraclavicular adenopathy present.       Left: No supraclavicular adenopathy present.  Neurological: He is alert and oriented to person, place, and time. Gait normal.  Skin: Skin is warm and dry.  Psychiatric: Mood, memory, affect and judgment normal.    Assessment and Plan :  Cough - Plan: HYDROcodone-homatropine (HYCODAN) 5-1.5 MG/5ML syrup  SOB (shortness of breath)  COPD, mild (HCC)  COPD exacerbation (HCC) - Plan: amoxicillin-clavulanate (AUGMENTIN) 875-125 MG tablet, Guaifenesin (MUCINEX MAXIMUM STRENGTH) 1200 MG TB12, predniSONE (DELTASONE) 10 MG tablet   Symptomatic care for patient - start abx due to likely COPD flair - continue albuterol and  symbicort. Start prednisone if he is not improving in the next 48 hours or worse tomorrow.  The augmentin will cover his possibility of strep (though low).  Push fluids.  Windell Hummingbird PA-C  [facility] 07/23/2016 9:39 AM

## 2016-07-23 NOTE — Patient Instructions (Addendum)
Continue using the albuterol as you need it.  Prednisone - needed if not improved in the next 48h and definitely worse in the next 24h  Use the cough syrup if needed.    IF you received an x-ray today, you will receive an invoice from [facility] Radiology. Please contact [facility] Radiology at [phone] with questions or concerns regarding your invoice.   IF you received labwork today, you will receive an invoice from [place]. Please contact [facility] at [phone] with questions or concerns regarding your invoice.   Our billing staff will not be able to assist you with questions regarding bills from these companies.  You will be contacted with the lab results as soon as they are available. The fastest way to get your results is to activate your My Chart account. Instructions are located on the last page of this paperwork. If you have not heard from [place] regarding the results in 2 weeks, please contact this office.

## 2016-08-01 MED FILL — TAMSULOSIN HCL 0.4 MG CAP: 0.4 | 30 days supply | Qty: 60 | Fill #5

## 2016-09-18 MED FILL — SYMBICORT 80-4.5 MCG INH: 80-4.5 | 30 days supply | Qty: 10 | Fill #3

## 2016-11-21 ENCOUNTER — Ambulatory Visit (INDEPENDENT_AMBULATORY_CARE_PROVIDER_SITE_OTHER): Payer: 59 | Admitting: Family Medicine

## 2016-11-21 ENCOUNTER — Encounter: Payer: Self-pay | Admitting: Family Medicine

## 2016-11-21 VITALS — BP 146/82 | HR 54 | Temp 98.2°F | Resp 16 | Ht 69.0 in | Wt 297.0 lb

## 2016-11-21 DIAGNOSIS — M545 Low back pain, unspecified: Secondary | ICD-10-CM

## 2016-11-21 DIAGNOSIS — N390 Urinary tract infection, site not specified: Secondary | ICD-10-CM | POA: Diagnosis not present

## 2016-11-21 DIAGNOSIS — R8290 Unspecified abnormal findings in urine: Secondary | ICD-10-CM | POA: Diagnosis not present

## 2016-11-21 DIAGNOSIS — R319 Hematuria, unspecified: Secondary | ICD-10-CM | POA: Diagnosis not present

## 2016-11-21 DIAGNOSIS — Z87442 Personal history of urinary calculi: Secondary | ICD-10-CM

## 2016-11-21 LAB — POC MICROSCOPIC URINALYSIS (UMFC): MUCUS RE: ABSENT

## 2016-11-21 LAB — POC URINALSYSI DIPSTICK (AUTOMATED)
Bilirubin, UA: NEGATIVE
Glucose, UA: NEGATIVE
Ketones, UA: NEGATIVE
NITRITE UA: NEGATIVE
PROTEIN UA: 100
Spec Grav, UA: 1.025 (ref 1.010–1.025)
Urobilinogen, UA: 0.2 E.U./dL
pH, UA: 5.5 (ref 5.0–8.0)

## 2016-11-21 MED ORDER — LEVOFLOXACIN 500 MG PO TABS: 500.0000 mg | ORAL_TABLET | Freq: Every day | ORAL | 0 refills | Status: DC

## 2016-11-21 MED ORDER — TAMSULOSIN HCL 0.4 MG PO CAPS: 0.4000 mg | ORAL_CAPSULE | Freq: Every day | ORAL | 1 refills | Status: DC

## 2016-11-21 MED FILL — levoFLOXacin 500 MG TABS: 500 | 10 days supply | Qty: 10 | Fill #0

## 2016-11-21 MED FILL — TAMSULOSIN HCL 0.4 MG CAP: 0.4 | 30 days supply | Qty: 30 | Fill #0

## 2016-11-21 NOTE — Patient Instructions (Addendum)
I will check PSA, urine culture and kidney testing today. Start levaquin once per day.  Make sure you drink plenty fluids. If you're not improving within the next 4872 hours, please return for recheck and would consider possible imaging at that time if you're still having back pain with your history of kidney stones. Please return sooner if worsening including fevers, nausea, vomiting.  I refilled Flomax today, but would discuss with your urologist how often you should be taking that medication. Initially I did prescribe it once per day.  Return to the clinic or go to the nearest emergency room if any of your symptoms worsen or new symptoms occur.   Urinary Tract Infection, Adult A urinary tract infection (UTI) is an infection of any part of the urinary tract, which includes the kidneys, ureters, bladder, and urethra. These organs make, store, and get rid of urine in the body. UTI can be a bladder infection (cystitis) or kidney infection (pyelonephritis). What are the causes? This infection may be caused by fungi, viruses, or bacteria. Bacteria are the most common cause of UTIs. This condition can also be caused by repeated incomplete emptying of the bladder during urination. What increases the risk? This condition is more likely to develop if:  You ignore your need to urinate or hold urine for long periods of time.  You do not empty your bladder completely during urination.  You wipe back to front after urinating or having a bowel movement, if you are male.  You are uncircumcised, if you are male.  You are constipated.  You have a urinary catheter that stays in place (indwelling).  You have a weak defense (immune) system.  You have a medical condition that affects your bowels, kidneys, or bladder.  You have diabetes.  You take antibiotic medicines frequently or for long periods of time, and the antibiotics no longer work well against certain types of infections (antibiotic  resistance).  You take medicines that irritate your urinary tract.  You are exposed to chemicals that irritate your urinary tract.  You are male.  What are the signs or symptoms? Symptoms of this condition include:  Fever.  Frequent urination or passing small amounts of urine frequently.  Needing to urinate urgently.  Pain or burning with urination.  Urine that smells bad or unusual.  Cloudy urine.  Pain in the lower abdomen or back.  Trouble urinating.  Blood in the urine.  Vomiting or being less hungry than normal.  Diarrhea or abdominal pain.  Vaginal discharge, if you are male.  How is this diagnosed? This condition is diagnosed with a medical history and physical exam. You will also need to provide a urine sample to test your urine. Other tests may be done, including:  Blood tests.  Sexually transmitted disease (STD) testing.  If you have had more than one UTI, a cystoscopy or imaging studies may be done to determine the cause of the infections. How is this treated? Treatment for this condition often includes a combination of two or more of the following:  Antibiotic medicine.  Other medicines to treat less common causes of UTI.  Over-the-counter medicines to treat pain.  Drinking enough water to stay hydrated.  Follow these instructions at home:  Take over-the-counter and prescription medicines only as told by your health care provider.  If you were prescribed an antibiotic, take it as told by your health care provider. Do not stop taking the antibiotic even if you start to feel better.  Avoid  alcohol, caffeine, tea, and carbonated beverages. They can irritate your bladder.  Drink enough fluid to keep your urine clear or pale yellow.  Keep all follow-up visits as told by your health care provider. This is important.  Make sure to: ? Empty your bladder often and completely. Do not hold urine for long periods of time. ? Empty your bladder  before and after sex. ? Wipe from front to back after a bowel movement if you are male. Use each tissue one time when you wipe. Contact a health care provider if:  You have back pain.  You have a fever.  You feel nauseous or vomit.  Your symptoms do not get better after 3 days.  Your symptoms go away and then return. Get help right away if:  You have severe back pain or lower abdominal pain.  You are vomiting and cannot keep down any medicines or water. This information is not intended to replace advice given to you by your health care provider. Make sure you discuss any questions you have with your health care provider. Document Released: 01/22/2005 Document Revised: 09/26/2015 Document Reviewed: 03/05/2015 Elsevier Interactive Patient Education  2017 [employer].   IF you received an x-ray today, you will receive an invoice from [facility] Radiology. Please contact [facility] Radiology at [phone] with questions or concerns regarding your invoice.   IF you received labwork today, you will receive an invoice from [place]. Please contact [facility] at [phone] with questions or concerns regarding your invoice.   Our billing staff will not be able to assist you with questions regarding bills from these companies.  You will be contacted with the lab results as soon as they are available. The fastest way to get your results is to activate your My Chart account. Instructions are located on the last page of this paperwork. If you have not heard from [place] regarding the results in 2 weeks, please contact this office.

## 2016-11-21 NOTE — Progress Notes (Signed)
Subjective:  By signing my name below, I, Essence Howell, attest that this documentation has been prepared under the direction and in the presence of Wendie Agreste, MD Electronically Signed: Ladene Artist, ED Scribe 11/21/2016 at 11:13 AM.   [name], male    DOB: [birth year], 61 y.o.   MRN: [id number]  Chief Complaint  Patient presents with  . Urinary Tract Infection    cloudy urine, left sided back pain   HPI Zachary Jacobs is a 61 y.o. male who presents to Primary Care at [facility] complaining of symptoms of UTI. Pt reports cloudy urine, less urination and left flank pain over the past few days. He reports increased pain with movement. Pt states that he ran out of Flomax a few weeks ago which he typically takes 1-2 times daily for kidney stones. He denies fever, dysuria, hematuria, urinary frequency, abdominal pain, nausea, vomiting, myalgias, radiating back pain. Pt did have a UA positive for coag negative staph in December 2016, resisted to cipro and septra. H/o kidney stones. Urologist: Dr. Karsten Ro. Pt states his last PSA by Dr. Karsten Ro ~1 year ago was normal and his prostate was not enlarged. No known drug allergies.  Lab Results  Component Value Date   PSA 0.34 03/02/2015   Patient Active Problem List   Diagnosis Date Noted  . Stag[place] renal calculus 03/20/2015  . Hydronephrosis    Past Medical History:  Diagnosis Date  . COPD, mild ([place])   . History of kidney stones   . OSA (obstructive sleep apnea)    s/p  surgery 1996  . Renal calculus, left   . Renal cyst, right    SIMPLE   Past Surgical History:  Procedure Laterality Date  . CYSTO/  RIGHT URETEROSCOPY/  STENT PLACEMENT  10/ 2001  . CYSTOSCOPY/URETEROSCOPY/HOLMIUM LASER/STENT PLACEMENT Left 03/30/2015   Procedure: LEFT URETEROSCOPY/HOLMIUM LASER LITHOTRIPSY/STENT PLACEMENT, WITH  STONE BASKET EXTRACTION;  Surgeon: Kathie Rhodes, MD;  Location: [facility];  Service: Urology;   Laterality: Left;  . HOLMIUM LASER APPLICATION Left 30/0/7622   Procedure: HOLMIUM LASER APPLICATION;  Surgeon: Kathie Rhodes, MD;  Location: [facility];  Service: Urology;  Laterality: Left;  . NEPHROLITHOTOMY Left 03/20/2015   Procedure: NEPHROLITHOTOMY PERCUTANEOUS;  Surgeon: Kathie Rhodes, MD;  Location: [facility];  Service: Urology;  Laterality: Left;  . UVULOPALATOPHARYNGOPLASTY  1996   nasal surgery   No Known Allergies Prior to Admission medications   Medication Sig Start Date End Date Taking? Authorizing Provider  albuterol (PROVENTIL HFA;VENTOLIN HFA) 108 (90 BASE) MCG/ACT inhaler Inhale 2 puffs into the lungs every 6 (six) hours as needed for wheezing or shortness of breath. 07/29/14   Copland, Gay Filler, MD  amoxicillin-clavulanate (AUGMENTIN) 875-125 MG tablet Take 1 tablet by mouth 2 (two) times daily. 07/23/16   Weber, Damaris Hippo, PA-C  budesonide-formoterol (SYMBICORT) 80-4.5 MCG/ACT inhaler Inhale 2 puffs into the lungs 2 (two) times daily. 11/23/15   Jaynee Eagles, PA-C  furosemide (LASIX) 40 MG tablet Take 1 tablet (40 mg total) by mouth daily. Patient not taking: Reported on 07/23/2016 06/11/15   Robyn Haber, MD  HYDROcodone-homatropine [facility]) 5-1.5 MG/5ML syrup Take 5 mLs by mouth every 8 (eight) hours as needed for cough. 07/23/16   Gale Journey, Sarah L, PA-C  ipratropium (ATROVENT) 0.03 % nasal spray Place 2 sprays into both nostrils 2 (two) times daily. Patient not taking: Reported on 07/23/2016 02/27/16   Scot Jun, FNP  Tamsulosin HCl (FLOMAX PO)  Take by mouth 2 (two) times daily.    [provider]   Social History   Social History  . Marital status: Married    Spouse name: N/A  . Number of children: N/A  . Years of education: N/A   Occupational History  . Not on file.   Social History Main Topics  . Smoking status: Former Smoker    Packs/day: 1.00    Years: 20.00    Types: Cigarettes    Quit date: 03/28/1987  . Smokeless tobacco:  Never Used  . Alcohol use No  . Drug use: No  . Sexual activity: Not on file   Other Topics Concern  . Not on file   Social History Narrative  . No narrative on file   Review of Systems  Constitutional: Negative for fever.  Gastrointestinal: Negative for abdominal pain, nausea and vomiting.  Genitourinary: Positive for decreased urine volume and flank pain. Negative for dysuria, frequency and hematuria.  Musculoskeletal: Negative for myalgias.      Objective:   Physical Exam  Constitutional: He is oriented to person, place, and time. He appears well-developed and well-nourished. No distress.  HENT:  Head: Normocephalic and atraumatic.  Eyes: Conjunctivae and EOM are normal.  Neck: Neck supple. No tracheal deviation present.  Cardiovascular: Normal rate and regular rhythm.   Pulmonary/Chest: Effort normal and breath sounds normal. No respiratory distress.  Abdominal: There is no CVA tenderness.  No true CVA tenderness.  Musculoskeletal: Normal range of motion.  Slight discomfort L lower paraspinal muscle. No midline bony tenderness.  Neurological: He is alert and oriented to person, place, and time.  Skin: Skin is warm and dry.  Psychiatric: He has a normal mood and affect. His behavior is normal.  Nursing note and vitals reviewed.  Vitals:   11/21/16 1032  BP: (!) 146/82  Pulse: (!) 54  Resp: 16  Temp: 98.2 F (36.8 C)  TempSrc: Oral  SpO2: 95%  Weight: 297 lb (134.7 kg)  Height: 5\' 9"  (1.753 m)   Results for orders placed or performed in visit on 11/21/16  POCT Urinalysis Dipstick (Automated)  Result Value Ref Range   Color, UA yellow    Clarity, UA cloudy    Glucose, UA negative    Bilirubin, UA negative    Ketones, UA negative    Spec Grav, UA 1.025 1.010 - 1.025   Blood, UA moderate    pH, UA 5.5 5.0 - 8.0   Protein, UA 100    Urobilinogen, UA 0.2 0.2 or 1.0 E.U./dL   Nitrite, UA negative    Leukocytes, UA Large (3+) (A) Negative  POCT Microscopic  Urinalysis ([facility])  Result Value Ref Range   WBC,UR,HPF,POC Many (A) None WBC/hpf   RBC,UR,HPF,POC Few (A) None RBC/hpf   Bacteria Many (A) None, Too numerous to count   Mucus Absent Absent   Epithelial Cells, UR Per Microscopy Few (A) None, Too numerous to count cells/hpf      Assessment & Plan:    Zachary Jacobs is a 61 y.o. male Urinary tract infection with hematuria, site unspecified - Plan: POCT Urinalysis Dipstick (Automated), POCT Microscopic Urinalysis ([facility]), Urine Culture, PSA, Basic metabolic panel, CBC, levofloxacin (LEVAQUIN) 500 MG tablet  Cloudy urine  Acute left-sided low back pain without sciatica - Plan: Basic metabolic panel  History of kidney stones - Plan: Basic metabolic panel, tamsulosin (FLOMAX) 0.4 MG CAPS capsule  Lower urinary tract infection, differential includes prostatitis. Slight left sided low back pain,  but does not appear to be renal cause. Suspected muscular/paraspinals. History of nephrolithiasis, but only minimal blood in urine today, primarily WBC.   -Restart Flomax 0.4 mg daily, asked him to verify dosing with his urologist.   -Prior urine culture reviewed with resistance to Cipro. Start Levaquin 500 milligrams daily #10, potential side effects including tendinopathy risks were discussed  - Check PSA  - Fluids, symptomatic care, RTC precautions given. Would consider imaging for stone if persistent symptoms beyond 48-72 hours  Meds ordered this encounter  Medications  . tamsulosin (FLOMAX) 0.4 MG CAPS capsule    Sig: Take 1 capsule (0.4 mg total) by mouth daily.    Dispense:  30 capsule    Refill:  1  . levofloxacin (LEVAQUIN) 500 MG tablet    Sig: Take 1 tablet (500 mg total) by mouth daily.    Dispense:  10 tablet    Refill:  0   Patient Instructions   I will check PSA, urine culture and kidney testing today. Start levaquin once per day.  Make sure you drink plenty fluids. If you're not improving within the next 4872 hours, please  return for recheck and would consider possible imaging at that time if you're still having back pain with your history of kidney stones. Please return sooner if worsening including fevers, nausea, vomiting.  I refilled Flomax today, but would discuss with your urologist how often you should be taking that medication. Initially I did prescribe it once per day.  Return to the clinic or go to the nearest emergency room if any of your symptoms worsen or new symptoms occur.   Urinary Tract Infection, Adult A urinary tract infection (UTI) is an infection of any part of the urinary tract, which includes the kidneys, ureters, bladder, and urethra. These organs make, store, and get rid of urine in the body. UTI can be a bladder infection (cystitis) or kidney infection (pyelonephritis). What are the causes? This infection may be caused by fungi, viruses, or bacteria. Bacteria are the most common cause of UTIs. This condition can also be caused by repeated incomplete emptying of the bladder during urination. What increases the risk? This condition is more likely to develop if:  You ignore your need to urinate or hold urine for long periods of time.  You do not empty your bladder completely during urination.  You wipe back to front after urinating or having a bowel movement, if you are male.  You are uncircumcised, if you are male.  You are constipated.  You have a urinary catheter that stays in place (indwelling).  You have a weak defense (immune) system.  You have a medical condition that affects your bowels, kidneys, or bladder.  You have diabetes.  You take antibiotic medicines frequently or for long periods of time, and the antibiotics no longer work well against certain types of infections (antibiotic resistance).  You take medicines that irritate your urinary tract.  You are exposed to chemicals that irritate your urinary tract.  You are male.  What are the signs or  symptoms? Symptoms of this condition include:  Fever.  Frequent urination or passing small amounts of urine frequently.  Needing to urinate urgently.  Pain or burning with urination.  Urine that smells bad or unusual.  Cloudy urine.  Pain in the lower abdomen or back.  Trouble urinating.  Blood in the urine.  Vomiting or being less hungry than normal.  Diarrhea or abdominal pain.  Vaginal discharge, if you are  male.  How is this diagnosed? This condition is diagnosed with a medical history and physical exam. You will also need to provide a urine sample to test your urine. Other tests may be done, including:  Blood tests.  Sexually transmitted disease (STD) testing.  If you have had more than one UTI, a cystoscopy or imaging studies may be done to determine the cause of the infections. How is this treated? Treatment for this condition often includes a combination of two or more of the following:  Antibiotic medicine.  Other medicines to treat less common causes of UTI.  Over-the-counter medicines to treat pain.  Drinking enough water to stay hydrated.  Follow these instructions at home:  Take over-the-counter and prescription medicines only as told by your health care provider.  If you were prescribed an antibiotic, take it as told by your health care provider. Do not stop taking the antibiotic even if you start to feel better.  Avoid alcohol, caffeine, tea, and carbonated beverages. They can irritate your bladder.  Drink enough fluid to keep your urine clear or pale yellow.  Keep all follow-up visits as told by your health care provider. This is important.  Make sure to: ? Empty your bladder often and completely. Do not hold urine for long periods of time. ? Empty your bladder before and after sex. ? Wipe from front to back after a bowel movement if you are male. Use each tissue one time when you wipe. Contact a health care provider if:  You have  back pain.  You have a fever.  You feel nauseous or vomit.  Your symptoms do not get better after 3 days.  Your symptoms go away and then return. Get help right away if:  You have severe back pain or lower abdominal pain.  You are vomiting and cannot keep down any medicines or water. This information is not intended to replace advice given to you by your health care provider. Make sure you discuss any questions you have with your health care provider. Document Released: 01/22/2005 Document Revised: 09/26/2015 Document Reviewed: 03/05/2015 Elsevier Interactive Patient Education  2017 [employer].   IF you received an x-ray today, you will receive an invoice from [facility]. Please contact [facility] at [phone] with questions or concerns regarding your invoice.   IF you received labwork today, you will receive an invoice from [facility]. Please contact [facility] at [phone] with questions or concerns regarding your invoice.   Our billing staff will not be able to assist you with questions regarding bills from these companies.  You will be contacted with the lab results as soon as they are available. The fastest way to get your results is to activate your My Chart account. Instructions are located on the last page of this paperwork. If you have not heard from [place] regarding the results in 2 weeks, please contact this office.       I personally performed the services described in this documentation, which was scribed in my presence. The recorded information has been reviewed and considered for accuracy and completeness, addended by me as needed, and agree with information above.  Signed,   Merri Ray, MD [facility] at [place].  11/22/16 1:39 PM

## 2016-11-22 LAB — CBC
HEMOGLOBIN: 15.2 g/dL (ref 13.0–17.7)
Hematocrit: 43.3 % (ref 37.5–51.0)
MCH: 32.1 pg (ref 26.6–33.0)
MCHC: 35.1 g/dL (ref 31.5–35.7)
MCV: 92 fL (ref 79–97)
PLATELETS: 271 10*3/uL (ref 150–379)
RBC: 4.73 x10E6/uL (ref 4.14–5.80)
RDW: 14.4 % (ref 12.3–15.4)
WBC: 10.3 10*3/uL (ref 3.4–10.8)

## 2016-11-22 LAB — BASIC METABOLIC PANEL
BUN / CREAT RATIO: 13 (ref 10–24)
BUN: 12 mg/dL (ref 8–27)
CO2: 24 mmol/L (ref 20–29)
CREATININE: 0.89 mg/dL (ref 0.76–1.27)
Calcium: 9 mg/dL (ref 8.6–10.2)
Chloride: 103 mmol/L (ref 96–106)
GFR calc Af Amer: 107 mL/min/{1.73_m2} (ref >59)
GFR calc non Af Amer: 93 mL/min/{1.73_m2} (ref >59)
Glucose: 92 mg/dL (ref 65–99)
Potassium: 4.4 mmol/L (ref 3.5–5.2)
SODIUM: 143 mmol/L (ref 134–144)

## 2016-11-22 LAB — PSA: Prostate Specific Ag, Serum: 0.7 ng/mL (ref 0.0–4.0)

## 2016-11-24 ENCOUNTER — Other Ambulatory Visit: Payer: Self-pay | Admitting: Urgent Care

## 2016-11-24 DIAGNOSIS — J449 Chronic obstructive pulmonary disease, unspecified: Secondary | ICD-10-CM

## 2016-11-24 LAB — URINE CULTURE

## 2016-11-24 MED FILL — SYMBICORT 80-4.5 MCG INH: 80-4.5 | 30 days supply | Qty: 10 | Fill #0

## 2016-12-16 MED FILL — TAMSULOSIN HCL 0.4 MG CAP: 0.4 | 30 days supply | Qty: 30 | Fill #1

## 2016-12-19 ENCOUNTER — Encounter: Payer: Self-pay | Admitting: Physician Assistant

## 2016-12-19 ENCOUNTER — Ambulatory Visit (INDEPENDENT_AMBULATORY_CARE_PROVIDER_SITE_OTHER): Payer: 59 | Admitting: Physician Assistant

## 2016-12-19 VITALS — BP 117/78 | HR 82 | Temp 98.4°F | Resp 18 | Ht 69.0 in | Wt 288.2 lb

## 2016-12-19 DIAGNOSIS — R8290 Unspecified abnormal findings in urine: Secondary | ICD-10-CM

## 2016-12-19 DIAGNOSIS — N3000 Acute cystitis without hematuria: Secondary | ICD-10-CM | POA: Diagnosis not present

## 2016-12-19 DIAGNOSIS — N200 Calculus of kidney: Secondary | ICD-10-CM | POA: Diagnosis not present

## 2016-12-19 DIAGNOSIS — R109 Unspecified abdominal pain: Secondary | ICD-10-CM

## 2016-12-19 LAB — POC MICROSCOPIC URINALYSIS (UMFC): MUCUS RE: ABSENT

## 2016-12-19 LAB — POCT URINALYSIS DIP (MANUAL ENTRY)
BILIRUBIN UA: NEGATIVE
BILIRUBIN UA: NEGATIVE mg/dL
GLUCOSE UA: NEGATIVE mg/dL
LEUKOCYTES UA: NEGATIVE
Nitrite, UA: NEGATIVE
PROTEIN UA: NEGATIVE mg/dL
SPEC GRAV UA: 1.025 (ref 1.010–1.025)
Urobilinogen, UA: 0.2 E.U./dL
pH, UA: 7 (ref 5.0–8.0)

## 2016-12-19 MED ORDER — SODIUM CHLORIDE 0.9 % IV BOLUS (SEPSIS)
1000.0000 mL | Freq: Once | INTRAVENOUS | Status: AC
  Administered 2016-12-19: 1000 mL via INTRAVENOUS

## 2016-12-19 MED ORDER — CIPROFLOXACIN HCL 500 MG PO TABS: 500.0000 mg | ORAL_TABLET | Freq: Two times a day (BID) | ORAL | 0 refills | Status: AC

## 2016-12-19 MED ORDER — CEFTRIAXONE SODIUM 1 G IJ SOLR
1.0000 g | Freq: Once | INTRAMUSCULAR | Status: AC
  Administered 2016-12-19: 1 g via INTRAMUSCULAR

## 2016-12-19 MED FILL — CIPROFLOXACIN HCL 500 MG TA: 500 | 5 days supply | Qty: 10 | Fill #0

## 2016-12-19 NOTE — Patient Instructions (Addendum)
If you develop sudden dizziness or fever greater than 100.4 then come back here or go to the ED.  Otherwise come back in next Monday so I can re-evaluate you.     IF you received an x-ray today, you will receive an invoice from [facility]. Please contact [facility] at [phone] with questions or concerns regarding your invoice.   IF you received labwork today, you will receive an invoice from [facility]. Please contact [facility] at [phone] with questions or concerns regarding your invoice.   Our billing staff will not be able to assist you with questions regarding bills from these companies.  You will be contacted with the lab results as soon as they are available. The fastest way to get your results is to activate your My Chart account. Instructions are located on the last page of this paperwork. If you have not heard from [place] regarding the results in 2 weeks, please contact this office.

## 2016-12-19 NOTE — Progress Notes (Signed)
12/19/2016 2:04 PM   DOB: Dec 15, 1955 / MRN: [id number]  SUBJECTIVE:  Zachary Jacobs is a 61 y.o. male with a history of nephrolithiasis presenting for multiple symptoms.  Complains of watery diarrhea, nausea, and foul smelling urine. Was recntly treated with keflex for a UTI and since has developed foul smelling diarrhea with more than 3-4 stools daily.  Associates some mild abdominal cramping. Tells me that he has passed two "smalls stones a few days ago. He has been trying to push fluids but feels thirsty today.  Denies dizziness with standing.   He has No Known Allergies.   He  has a past medical history of COPD, mild ([place]); History of kidney stones; OSA (obstructive sleep apnea); Renal calculus, left; and Renal cyst, right.    He  reports that he quit smoking about 29 years ago. His smoking use included Cigarettes. He has a 20.00 pack-year smoking history. He has never used smokeless tobacco. He reports that he does not drink alcohol or use drugs. He  has no sexual activity history on file. The patient  has a past surgical history that includes Nephrolithotomy (Left, 03/20/2015); CYSTO/  RIGHT URETEROSCOPY/  STENT PLACEMENT (10/ 2001); Uvulopalatopharyngoplasty (1996); Cystoscopy/ureteroscopy/holmium laser/stent placement (Left, 03/30/2015); and Holmium laser application (Left, 32/06/5571).  His family history includes Cancer in his mother and other; Heart disease in his father; Obesity in his other.  Review of Systems  Constitutional: Negative for chills and fever.  HENT: Negative for sore throat.   Respiratory: Negative for cough.   Cardiovascular: Negative for chest pain.  Gastrointestinal: Positive for diarrhea, nausea and vomiting. Negative for blood in stool, constipation and melena.  Genitourinary: Negative for dysuria.  Skin: Negative for itching and rash.  Neurological: Negative for dizziness.    The problem list and medications were reviewed and updated by myself where  necessary and exist elsewhere in the encounter.   OBJECTIVE:  BP 117/78   Pulse 82   Temp 98.4 F (36.9 C) (Oral)   Resp 18   Ht '5\' 9"'  (1.753 m)   Wt 288 lb 3.2 oz (130.7 kg)   SpO2 95%   BMI 42.56 kg/m   Physical Exam  Constitutional: He is oriented to person, place, and time. He appears well-developed. He is active and cooperative.  Non-toxic appearance. He has a sickly appearance. He appears ill. No distress.  Cardiovascular: Normal rate, regular rhythm, S1 normal, S2 normal, normal heart sounds, intact distal pulses and normal pulses.  Exam reveals no gallop and no friction rub.   No murmur heard. Pulmonary/Chest: Effort normal. No stridor. No tachypnea. No respiratory distress. He has no wheezes. He has no rales.  Abdominal: Soft. Bowel sounds are normal. He exhibits no distension and no mass. There is tenderness (mild about the bilateral lower quadrants). There is no rebound and no guarding.  Genitourinary: Prostate normal. Prostate is not enlarged and not tender.  Musculoskeletal: He exhibits no edema.  Neurological: He is alert and oriented to person, place, and time.  Skin: Skin is warm and dry. He is not diaphoretic. No pallor.  Vitals reviewed.   Results for orders placed or performed in visit on 12/19/16 (from the past 72 hour(s))  POCT urinalysis dipstick     Status: Abnormal   Collection Time: 12/19/16 11:19 AM  Result Value Ref Range   Color, UA yellow yellow   Clarity, UA clear clear   Glucose, UA negative negative mg/dL   Bilirubin, UA negative negative  Ketones, POC UA negative negative mg/dL   Spec Grav, UA 1.025 1.010 - 1.025   Blood, UA trace-lysed (A) negative   pH, UA 7.0 5.0 - 8.0   Protein Ur, POC negative negative mg/dL   Urobilinogen, UA 0.2 0.2 or 1.0 E.U./dL   Nitrite, UA Negative Negative   Leukocytes, UA Negative Negative  POCT Microscopic Urinalysis ([facility])     Status: Abnormal   Collection Time: 12/19/16 11:31 AM  Result Value Ref Range     WBC,UR,HPF,POC Too numerous to count  (A) None WBC/hpf   RBC,UR,HPF,POC None None RBC/hpf   Bacteria Moderate (A) None, Too numerous to count   Mucus Absent Absent   Epithelial Cells, UR Per Microscopy Few (A) None, Too numerous to count cells/hpf       No results found.  ASSESSMENT AND PLAN:  Zachary Jacobs was seen today for nausea, diarrhea and urine smell.  Diagnoses and all orders for this visit:  Malodorous urine: Urine shows UTI, however he was just treated with Keflex which I do wonder about the possibility of prostatitis. His prostate is not tender today.  He does have some flank tenderness.  Will go ahead and treat for a pyelonephritis for now.  Will see him back on Monday.  RTC and ED precaustions discussed per AVS.  -     POCT urinalysis dipstick -     POCT Microscopic Urinalysis ([facility])  Acute cystitis without hematuria -     Urine Culture -     cefTRIAXone (ROCEPHIN) injection 1 g; Inject 1 g into the muscle once. -     PSA -     ciprofloxacin (CIPRO) 500 MG tablet; Take 1 tablet (500 mg total) by mouth 2 (two) times daily.  Flank pain: See problem one.  -     Clostridium Difficile by PCR -     CBC with Differential/Platelet -     CMP14+EGFR -     cefTRIAXone (ROCEPHIN) injection 1 g; Inject 1 g into the muscle once. -     Insert peripheral IV -     sodium chloride 0.9 % bolus 1,000 mL; Inject 1,000 mLs into the vein once. -     ciprofloxacin (CIPRO) 500 MG tablet; Take 1 tablet (500 mg total) by mouth 2 (two) times daily.  Nephrolithiasis -     Stone analysis      The patient is advised to call or return to clinic if he does not see an improvement in symptoms, or to seek the care of the closest emergency department if he worsens with the above plan.   Philis Fendt, MHS, PA-C [facility] at [facility] 12/19/2016 2:04 PM

## 2016-12-20 LAB — CBC WITH DIFFERENTIAL/PLATELET
Basophils Absolute: 0 10*3/uL (ref 0.0–0.2)
Basos: 0 %
EOS (ABSOLUTE): 0.9 10*3/uL — ABNORMAL HIGH (ref 0.0–0.4)
Eos: 7 %
HEMATOCRIT: 46.6 % (ref 37.5–51.0)
HEMOGLOBIN: 15.8 g/dL (ref 13.0–17.7)
Immature Grans (Abs): 0 10*3/uL (ref 0.0–0.1)
Immature Granulocytes: 0 %
LYMPHS ABS: 2.5 10*3/uL (ref 0.7–3.1)
LYMPHS: 19 %
MCH: 31.3 pg (ref 26.6–33.0)
MCHC: 33.9 g/dL (ref 31.5–35.7)
MCV: 93 fL (ref 79–97)
MONOCYTES: 11 %
MONOS ABS: 1.5 10*3/uL — AB (ref 0.1–0.9)
NEUTROS ABS: 7.9 10*3/uL — AB (ref 1.4–7.0)
Neutrophils: 63 %
Platelets: 331 10*3/uL (ref 150–379)
RBC: 5.04 x10E6/uL (ref 4.14–5.80)
RDW: 13.2 % (ref 12.3–15.4)
WBC: 12.8 10*3/uL — AB (ref 3.4–10.8)

## 2016-12-20 LAB — CMP14+EGFR
A/G RATIO: 1.1 — AB (ref 1.2–2.2)
ALBUMIN: 4.1 g/dL (ref 3.6–4.8)
ALK PHOS: 101 IU/L (ref 39–117)
ALT: 18 IU/L (ref 0–44)
AST: 19 IU/L (ref 0–40)
BILIRUBIN TOTAL: 0.5 mg/dL (ref 0.0–1.2)
BUN/Creatinine Ratio: 12 (ref 10–24)
BUN: 12 mg/dL (ref 8–27)
CHLORIDE: 103 mmol/L (ref 96–106)
CO2: 22 mmol/L (ref 20–29)
Calcium: 8.8 mg/dL (ref 8.6–10.2)
Creatinine, Ser: 0.99 mg/dL (ref 0.76–1.27)
GFR calc Af Amer: 95 mL/min/{1.73_m2} (ref >59)
GFR calc non Af Amer: 82 mL/min/{1.73_m2} (ref >59)
Globulin, Total: 3.7 g/dL (ref 1.5–4.5)
Glucose: 107 mg/dL — ABNORMAL HIGH (ref 65–99)
POTASSIUM: 3.8 mmol/L (ref 3.5–5.2)
Sodium: 139 mmol/L (ref 134–144)
Total Protein: 7.8 g/dL (ref 6.0–8.5)

## 2016-12-20 LAB — PSA: PROSTATE SPECIFIC AG, SERUM: 0.9 ng/mL (ref 0.0–4.0)

## 2016-12-21 LAB — CLOSTRIDIUM DIFFICILE BY PCR: CDIFFPCR: NEGATIVE

## 2016-12-22 ENCOUNTER — Encounter: Payer: Self-pay | Admitting: Physician Assistant

## 2016-12-22 ENCOUNTER — Ambulatory Visit (INDEPENDENT_AMBULATORY_CARE_PROVIDER_SITE_OTHER): Payer: 59 | Admitting: Physician Assistant

## 2016-12-22 VITALS — BP 121/76 | HR 56 | Temp 98.5°F | Resp 16 | Ht 68.0 in | Wt 290.2 lb

## 2016-12-22 DIAGNOSIS — N3000 Acute cystitis without hematuria: Secondary | ICD-10-CM

## 2016-12-22 DIAGNOSIS — Z8701 Personal history of pneumonia (recurrent): Secondary | ICD-10-CM

## 2016-12-22 DIAGNOSIS — Z1211 Encounter for screening for malignant neoplasm of colon: Secondary | ICD-10-CM

## 2016-12-22 DIAGNOSIS — Z13220 Encounter for screening for lipoid disorders: Secondary | ICD-10-CM | POA: Diagnosis not present

## 2016-12-22 DIAGNOSIS — Z131 Encounter for screening for diabetes mellitus: Secondary | ICD-10-CM | POA: Diagnosis not present

## 2016-12-22 DIAGNOSIS — Z23 Encounter for immunization: Secondary | ICD-10-CM

## 2016-12-22 DIAGNOSIS — R062 Wheezing: Secondary | ICD-10-CM

## 2016-12-22 DIAGNOSIS — J449 Chronic obstructive pulmonary disease, unspecified: Secondary | ICD-10-CM | POA: Diagnosis not present

## 2016-12-22 MED ORDER — IPRATROPIUM BROMIDE 0.02 % IN SOLN
0.5000 mg | Freq: Once | RESPIRATORY_TRACT | Status: AC
  Administered 2016-12-22: 0.5 mg via RESPIRATORY_TRACT

## 2016-12-22 MED ORDER — ALBUTEROL SULFATE (2.5 MG/3ML) 0.083% IN NEBU: 2.5000 mg | INHALATION_SOLUTION | Freq: Four times a day (QID) | RESPIRATORY_TRACT | 1 refills | Status: DC | PRN

## 2016-12-22 MED ORDER — BUDESONIDE-FORMOTEROL FUMARATE 80-4.5 MCG/ACT IN AERO: 2.0000 | INHALATION_SPRAY | Freq: Two times a day (BID) | RESPIRATORY_TRACT | 11 refills | Status: DC

## 2016-12-22 MED ORDER — ALBUTEROL SULFATE (2.5 MG/3ML) 0.083% IN NEBU
2.5000 mg | INHALATION_SOLUTION | Freq: Once | RESPIRATORY_TRACT | Status: AC
  Administered 2016-12-22: 2.5 mg via RESPIRATORY_TRACT

## 2016-12-22 NOTE — Progress Notes (Signed)
12/22/2016 9:32 AM   DOB: 08/09/1955 / MRN: [id number]  SUBJECTIVE:  Zachary Jacobs is a 62 y.o. male presenting for   Diagnosed with COPD about three years ago. Rads did show hyperinflation.  Has been prescribed a rescue inhaler and steroid inhaler about three years ago.  He has passed two stones since seeing me last.  His health is managed by PCP and mostly Dr. Joseph Art. Denies regular night time cough. Tells me that he has about 1 exacerbation a year.   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   Has a history of sleep apnea.  Has not been for a colonoscopy yet and is worried about his history of sleep apnea in this regard.   Immunization History  Administered Date(s) Administered  . Influenza-Unspecified 01/05/2015   He has No Known Allergies.   He  has a past medical history of COPD, mild ([place]); History of kidney stones; OSA (obstructive sleep apnea); Renal calculus, left; and Renal cyst, right.    He  reports that he quit smoking about 29 years ago. His smoking use included Cigarettes. He has a 20.00 pack-year smoking history. He has never used smokeless tobacco. He reports that he does not drink alcohol or use drugs. He  has no sexual activity history on file. The patient  has a past surgical history that includes Nephrolithotomy (Left, 03/20/2015); CYSTO/  RIGHT URETEROSCOPY/  STENT PLACEMENT (10/ 2001); Uvulopalatopharyngoplasty (1996); Cystoscopy/ureteroscopy/holmium laser/stent placement (Left, 03/30/2015); and Holmium laser application (Left, 49/07/4965).  His family history includes Cancer in his mother and other; Heart disease in his father; Obesity in his other.  Review of Systems  Constitutional: Negative for chills, diaphoresis and fever.  Respiratory: Positive for cough and wheezing. Negative for hemoptysis, sputum production and shortness of breath.   Cardiovascular: Negative for chest pain,  orthopnea and leg swelling.  Gastrointestinal: Negative for nausea.  Skin: Negative for rash.  Neurological: Negative for dizziness.    The problem list and medications were reviewed and updated by myself where necessary and exist elsewhere in the encounter.   OBJECTIVE:  BP 121/76   Pulse (!) 56   Temp 98.5 F (36.9 C) (Oral)   Resp 16   Ht 5\' 8"  (1.727 m)   Wt 290 lb 3.2 oz (131.6 kg)   SpO2 98%   BMI 44.12 kg/m      Physical Exam  Constitutional: He appears well-developed. He is active and cooperative.  Non-toxic appearance.  Cardiovascular: Normal rate, regular rhythm, S1 normal, S2 normal, normal heart sounds, intact distal pulses and normal pulses.  Exam reveals no gallop and no friction rub.   No murmur heard. Pulmonary/Chest: Effort normal. No stridor. No tachypnea. No respiratory distress. He has no wheezes. He has no rales.  Abdominal: He exhibits no distension.  Musculoskeletal: He exhibits no edema.  Neurological: He is alert.  Skin: Skin is warm and dry. He is not diaphoretic. No pallor.  Vitals reviewed.     No results found.  ASSESSMENT AND PLAN:  Zachary Jacobs was seen today for nephrolithiasis.  Diagnoses and all orders for this visit:  Acute cystitis without hematuria: He will continue cipro. Culture still pending.  -     CBC with Differential/Platelet  Screening for malignant neoplasm of colon: He will see Dr. Carlean Purl for this.  -     Ambulatory referral to Gastroenterology  History of pneumonia: x 2Tells me  had a pneumococcal 23 already.  -     Pneumococcal conjugate vaccine 13-valent IM  Screening for lipid disorders: Non smoker.  No HTN.  Morbidly obese.  -     Lipid Panel  Chronic obstructive pulmonary disease, unspecified COPD type [facility]): Well managed.  -     budesonide-formoterol (SYMBICORT) 80-4.5 MCG/ACT inhaler; Inhale 2 puffs into the lungs 2 (two) times daily. -     DME Nebulizer machine  Wheezing: Given chronicity and at least one  exacerbation a year nebs at home would be helpful for him.  -     ipratropium (ATROVENT) nebulizer solution 0.5 mg; Take 2.5 mLs (0.5 mg total) by nebulization once. -     albuterol (PROVENTIL) (2.5 MG/3ML) 0.083% nebulizer solution 2.5 mg; Take 3 mLs (2.5 mg total) by nebulization once.  Screening for diabetes mellitus -     Hemoglobin A1c     The patient is advised to call or return to clinic if he does not see an improvement in symptoms, or to seek the care of the closest emergency department if he worsens with the above plan.   Philis Fendt, MHS, PA-C [facility] 12/22/2016 9:32 AM

## 2016-12-22 NOTE — Patient Instructions (Signed)
     IF you received an x-ray today, you will receive an invoice from [facility]. Please contact [facility] at [phone] with questions or concerns regarding your invoice.   IF you received labwork today, you will receive an invoice from [facility]. Please contact [facility] at [phone] with questions or concerns regarding your invoice.   Our billing staff will not be able to assist you with questions regarding bills from these companies.  You will be contacted with the lab results as soon as they are available. The fastest way to get your results is to activate your My Chart account. Instructions are located on the last page of this paperwork. If you have not heard from us regarding the results in 2 weeks, please contact this office.     

## 2016-12-23 LAB — CBC WITH DIFFERENTIAL/PLATELET
BASOS: 0 %
Basophils Absolute: 0 10*3/uL (ref 0.0–0.2)
EOS (ABSOLUTE): 0.6 10*3/uL — ABNORMAL HIGH (ref 0.0–0.4)
EOS: 6 %
HEMATOCRIT: 44.4 % (ref 37.5–51.0)
HEMOGLOBIN: 15.2 g/dL (ref 13.0–17.7)
Immature Grans (Abs): 0 10*3/uL (ref 0.0–0.1)
Immature Granulocytes: 0 %
LYMPHS ABS: 2 10*3/uL (ref 0.7–3.1)
Lymphs: 21 %
MCH: 31.7 pg (ref 26.6–33.0)
MCHC: 34.2 g/dL (ref 31.5–35.7)
MCV: 93 fL (ref 79–97)
MONOCYTES: 8 %
Monocytes Absolute: 0.7 10*3/uL (ref 0.1–0.9)
NEUTROS ABS: 6 10*3/uL (ref 1.4–7.0)
Neutrophils: 65 %
Platelets: 307 10*3/uL (ref 150–379)
RBC: 4.8 x10E6/uL (ref 4.14–5.80)
RDW: 13.7 % (ref 12.3–15.4)
WBC: 9.4 10*3/uL (ref 3.4–10.8)

## 2016-12-23 LAB — LIPID PANEL
CHOLESTEROL TOTAL: 129 mg/dL (ref 100–199)
Chol/HDL Ratio: 3.7 ratio (ref 0.0–5.0)
HDL: 35 mg/dL — AB (ref >39)
LDL Calculated: 66 mg/dL (ref 0–99)
TRIGLYCERIDES: 138 mg/dL (ref 0–149)
VLDL CHOLESTEROL CAL: 28 mg/dL (ref 5–40)

## 2016-12-23 LAB — URINE CULTURE

## 2016-12-26 LAB — STONE ANALYSIS
CA OXALATE, DIHYDRATE: 15 %
Ca Oxalate,Monohydr.: 55 %
Ca phos cry stone ql IR: 25 %
Stone Weight: 96.4 mg
URIC ACID: 5 %

## 2016-12-28 ENCOUNTER — Encounter: Payer: Self-pay | Admitting: Physician Assistant

## 2016-12-30 ENCOUNTER — Encounter: Payer: Self-pay | Admitting: Internal Medicine

## 2016-12-30 ENCOUNTER — Encounter: Payer: Self-pay | Admitting: Physician Assistant

## 2016-12-30 DIAGNOSIS — R3914 Feeling of incomplete bladder emptying: Secondary | ICD-10-CM | POA: Diagnosis not present

## 2016-12-30 DIAGNOSIS — N200 Calculus of kidney: Secondary | ICD-10-CM | POA: Diagnosis not present

## 2017-01-06 MED FILL — TAMSULOSIN HCL 0.4 MG CAP: 0.4 | 30 days supply | Qty: 30 | Fill #0

## 2017-01-06 MED FILL — SYMBICORT 80-4.5 MCG INH: 80-4.5 | 30 days supply | Qty: 10 | Fill #0

## 2017-01-27 DIAGNOSIS — N3000 Acute cystitis without hematuria: Secondary | ICD-10-CM | POA: Diagnosis not present

## 2017-01-27 DIAGNOSIS — N390 Urinary tract infection, site not specified: Secondary | ICD-10-CM | POA: Diagnosis not present

## 2017-01-27 DIAGNOSIS — R3914 Feeling of incomplete bladder emptying: Secondary | ICD-10-CM | POA: Diagnosis not present

## 2017-01-27 DIAGNOSIS — B954 Other streptococcus as the cause of diseases classified elsewhere: Secondary | ICD-10-CM | POA: Diagnosis not present

## 2017-01-27 DIAGNOSIS — N200 Calculus of kidney: Secondary | ICD-10-CM | POA: Diagnosis not present

## 2017-01-27 MED FILL — AMOX-CLAV 875-125 MG TABLET: 875-125 | 10 days supply | Qty: 20 | Fill #0

## 2017-02-06 MED FILL — SYMBICORT 80-4.5 MCG INH: 80-4.5 | 30 days supply | Qty: 10 | Fill #1

## 2017-02-06 MED FILL — TAMSULOSIN HCL 0.4 MG CAP: 0.4 | 30 days supply | Qty: 30 | Fill #1

## 2017-02-20 ENCOUNTER — Encounter: Payer: Self-pay | Admitting: Internal Medicine

## 2017-02-20 ENCOUNTER — Ambulatory Visit (AMBULATORY_SURGERY_CENTER): Payer: Self-pay | Admitting: *Deleted

## 2017-02-20 VITALS — Ht 70.0 in | Wt 291.8 lb

## 2017-02-20 DIAGNOSIS — Z1211 Encounter for screening for malignant neoplasm of colon: Secondary | ICD-10-CM

## 2017-02-20 NOTE — Progress Notes (Signed)
No egg or soy allergy known to patient  No issues with past sedation with any surgeries  or procedures, no intubation problems  No diet pills per patient No home 02 use per patient  No blood thinners per patient  Pt denies issues with constipation  No A fib or A flutter  EMMI video sent to pt's e mail  Pt states when he was in [employer] school " someone " , a CRNA told him he would be a difficult intubation, but with no surgeries has he ever been told that . Had J Monday CRNA come and speak with pt. Pt has never been given a letter stating he was difficult to intubate

## 2017-03-03 ENCOUNTER — Ambulatory Visit: Payer: 59 | Admitting: Family Medicine

## 2017-03-05 ENCOUNTER — Telehealth: Payer: Self-pay | Admitting: Internal Medicine

## 2017-03-05 NOTE — Telephone Encounter (Signed)
Phoned patient to let him know that it is ok to proceed with procedure tomorrow.

## 2017-03-05 NOTE — Telephone Encounter (Signed)
OK to proceed

## 2017-03-05 NOTE — Telephone Encounter (Signed)
Returned patients call with no answer.  VM not set up.  Unable to leave a message.  Will attempt to try again.

## 2017-03-05 NOTE — Telephone Encounter (Signed)
Spoke with patient.  He states that he had nausea and diarrhea Monday and Tuesday.  He is fine now and able to eat without vomiting.  He had a normal BM yesterday.  He was without fever during this time.  Wants to know if it is ok to proceed with colonoscopy tomorrow.

## 2017-03-06 ENCOUNTER — Other Ambulatory Visit: Payer: Self-pay

## 2017-03-06 ENCOUNTER — Encounter: Payer: Self-pay | Admitting: Internal Medicine

## 2017-03-06 ENCOUNTER — Ambulatory Visit (AMBULATORY_SURGERY_CENTER): Payer: 59 | Admitting: Internal Medicine

## 2017-03-06 VITALS — BP 136/72 | HR 51 | Temp 97.3°F | Resp 9 | Ht 70.0 in | Wt 291.0 lb

## 2017-03-06 DIAGNOSIS — K621 Rectal polyp: Secondary | ICD-10-CM

## 2017-03-06 DIAGNOSIS — K620 Anal polyp: Secondary | ICD-10-CM | POA: Diagnosis not present

## 2017-03-06 DIAGNOSIS — G4733 Obstructive sleep apnea (adult) (pediatric): Secondary | ICD-10-CM | POA: Diagnosis not present

## 2017-03-06 DIAGNOSIS — Z1212 Encounter for screening for malignant neoplasm of rectum: Secondary | ICD-10-CM | POA: Diagnosis not present

## 2017-03-06 DIAGNOSIS — D129 Benign neoplasm of anus and anal canal: Secondary | ICD-10-CM

## 2017-03-06 DIAGNOSIS — J449 Chronic obstructive pulmonary disease, unspecified: Secondary | ICD-10-CM | POA: Diagnosis not present

## 2017-03-06 DIAGNOSIS — Z1211 Encounter for screening for malignant neoplasm of colon: Secondary | ICD-10-CM

## 2017-03-06 DIAGNOSIS — D128 Benign neoplasm of rectum: Secondary | ICD-10-CM

## 2017-03-06 MED ORDER — SODIUM CHLORIDE 0.9 % IV SOLN: 500.0000 mL | INTRAVENOUS | Status: DC

## 2017-03-06 NOTE — Progress Notes (Signed)
Report to RN, VSS, adequate respirations noted, no c/o pain or discomfort 

## 2017-03-06 NOTE — Progress Notes (Signed)
Pt's states no medical or surgical changes since previsit or office visit. 

## 2017-03-06 NOTE — Patient Instructions (Addendum)
I found and removed 4 tiny rectal polyps, took biopsies of an abnormal anal papilla (looks inflamed).  You also have internal hemorrhoids.  I will let you know pathology results and when to have another routine colonoscopy by mail and/or My Chart.  I appreciate the opportunity to care for you. Gatha Mayer, MD, [facility]  Handouts given on polyps and hemorrhoids!   YOU HAD AN ENDOSCOPIC PROCEDURE TODAY AT [facility]:   Refer to the procedure report that was given to you for any specific questions about what was found during the examination.  If the procedure report does not answer your questions, please call your gastroenterologist to clarify.  If you requested that your care partner not be given the details of your procedure findings, then the procedure report has been included in a sealed envelope for you to review at your convenience later.  YOU SHOULD EXPECT: Some feelings of bloating in the abdomen. Passage of more gas than usual.  Walking can help get rid of the air that was put into your GI tract during the procedure and reduce the bloating. If you had a lower endoscopy (such as a colonoscopy or flexible sigmoidoscopy) you may notice spotting of blood in your stool or on the toilet paper. If you underwent a bowel prep for your procedure, you may not have a normal bowel movement for a few days.  Please Note:  You might notice some irritation and congestion in your nose or some drainage.  This is from the oxygen used during your procedure.  There is no need for concern and it should clear up in a day or so.  SYMPTOMS TO REPORT IMMEDIATELY:   Following lower endoscopy (colonoscopy or flexible sigmoidoscopy):  Excessive amounts of blood in the stool  Significant tenderness or worsening of abdominal pains  Swelling of the abdomen that is new, acute  Fever of 100F or higher  For urgent or emergent issues, a gastroenterologist can be reached at any hour by  calling [phone].   DIET:  We do recommend a small meal at first, but then you may proceed to your regular diet.  Drink plenty of fluids but you should avoid alcoholic beverages for 24 hours.  ACTIVITY:  You should plan to take it easy for the rest of today and you should NOT DRIVE or use heavy machinery until tomorrow (because of the sedation medicines used during the test).    FOLLOW UP: Our staff will call the number listed on your records the next business day following your procedure to check on you and address any questions or concerns that you may have regarding the information given to you following your procedure. If we do not reach you, we will leave a message.  However, if you are feeling well and you are not experiencing any problems, there is no need to return our call.  We will assume that you have returned to your regular daily activities without incident.  If any biopsies were taken you will be contacted by phone or by letter within the next 1-3 weeks.  Please call us at [phone] if you have not heard about the biopsies in 3 weeks.    SIGNATURES/CONFIDENTIALITY: You and/or your care partner have signed paperwork which will be entered into your electronic medical record.  These signatures attest to the fact that that the information above on your After Visit Summary has been reviewed and is understood.  Full responsibility of the  confidentiality of this discharge information lies with you and/or your care-partner. 

## 2017-03-06 NOTE — Op Note (Signed)
[place] Patient Name: Zachary Jacobs Procedure Date: 03/06/2017 8:19 AM MRN: [id number] Endoscopist: Gatha Mayer , MD Age: 61 Referring MD:  Date of Birth: Mar 15, 1956 Gender: Male Account #: [id number] Procedure:                Colonoscopy Indications:              Screening for colorectal malignant neoplasm, This                            is the patient's first colonoscopy Medicines:                Propofol per Anesthesia, Monitored Anesthesia Care Procedure:                Pre-Anesthesia Assessment:                           - Prior to the procedure, a History and Physical                            was performed, and patient medications and                            allergies were reviewed. The patient's tolerance of                            previous anesthesia was also reviewed. The risks                            and benefits of the procedure and the sedation                            options and risks were discussed with the patient.                            All questions were answered, and informed consent                            was obtained. Prior Anticoagulants: The patient has                            taken no previous anticoagulant or antiplatelet                            agents. ASA Grade Assessment: II - A patient with                            mild systemic disease. After reviewing the risks                            and benefits, the patient was deemed in                            satisfactory condition to undergo the procedure.  After obtaining informed consent, the colonoscope                            was passed under direct vision. Throughout the                            procedure, the patient's blood pressure, pulse, and                            oxygen saturations were monitored continuously. The                            Colonoscope was introduced through the anus and   advanced to the the cecum, identified by                            appendiceal orifice and ileocecal valve. The                            colonoscopy was performed without difficulty. The                            patient tolerated the procedure well. The quality                            of the bowel preparation was good. The ileocecal                            valve, appendiceal orifice, and rectum were                            photographed. The bowel preparation used was                            Miralax. Scope In: 8:42:01 AM Scope Out: 9:01:03 AM Scope Withdrawal Time: 0 hours 15 minutes 4 seconds  Total Procedure Duration: 0 hours 19 minutes 2 seconds  Findings:                 The perianal and digital rectal examinations were                            normal. Pertinent negatives include normal prostate                            (size, shape, and consistency).                           Four sessile polyps were found in the rectum. The                            polyps were diminutive in size. These polyps were                            removed with a cold snare. Resection and retrieval  were complete. Verification of patient                            identification for the specimen was done. Estimated                            blood loss was minimal.                           Anal papilla(e) were hypertrophied and ulcerated                            (one) Biopsies were taken with a cold forceps for                            histology. Verification of patient identification                            for the specimen was done. Estimated blood loss was                            minimal.                           Internal hemorrhoids were found during retroflexion.                           The exam was otherwise without abnormality on                            direct and retroflexion views. Complications:            No immediate  complications. Estimated Blood Loss:     Estimated blood loss was minimal. Impression:               - Four diminutive polyps in the rectum, removed                            with a cold snare. Resected and retrieved.                           - Anal papilla(e) were hypertrophied and ulcerated                            (one) Biopsied.                           - Internal hemorrhoids.                           - The examination was otherwise normal on direct                            and retroflexion views. Recommendation:           - Patient has a contact number available for  emergencies. The signs and symptoms of potential                            delayed complications were discussed with the                            patient. Return to normal activities tomorrow.                            Written discharge instructions were provided to the                            patient.                           - Continue present medications.                           - Resume previous diet.                           - Repeat colonoscopy for surveillance based on                            pathology results. Gatha Mayer, MD 03/06/2017 9:12:53 AM This report has been signed electronically.

## 2017-03-09 ENCOUNTER — Telehealth: Payer: Self-pay

## 2017-03-09 NOTE — Telephone Encounter (Signed)
Follow up call.  Unable to leave a message at [phone].  No answer.  Pt has a voice mailbox that is not set up yet.  maw

## 2017-03-09 NOTE — Telephone Encounter (Signed)
  Follow up Call-  Call back number 03/06/2017  Post procedure Call Back phone  # [phone]  Permission to leave phone message Yes  Some recent data might be hidden     Patient questions:  Do you have a fever, pain , or abdominal swelling? No. Pain Score  0 *  Have you tolerated food without any problems? Yes.    Have you been able to return to your normal activities? Yes.    Do you have any questions about your discharge instructions: Diet   No. Medications  No. Follow up visit  No.  Do you have questions or concerns about your Care? No.  Actions: * If pain score is 4 or above: No action needed, pain <4.   No problems noted per pt. maw

## 2017-03-11 ENCOUNTER — Encounter: Payer: Self-pay | Admitting: Internal Medicine

## 2017-03-11 NOTE — Progress Notes (Signed)
Hyperplastic rectal polyps and benign anal papilla My Chart letter

## 2017-03-13 ENCOUNTER — Encounter: Payer: Self-pay | Admitting: Emergency Medicine

## 2017-03-13 ENCOUNTER — Ambulatory Visit (INDEPENDENT_AMBULATORY_CARE_PROVIDER_SITE_OTHER): Payer: 59 | Admitting: Emergency Medicine

## 2017-03-13 ENCOUNTER — Other Ambulatory Visit: Payer: Self-pay

## 2017-03-13 VITALS — BP 116/64 | HR 63 | Temp 98.5°F | Resp 16 | Ht 69.0 in | Wt 286.6 lb

## 2017-03-13 DIAGNOSIS — Z87442 Personal history of urinary calculi: Secondary | ICD-10-CM | POA: Diagnosis not present

## 2017-03-13 DIAGNOSIS — N390 Urinary tract infection, site not specified: Secondary | ICD-10-CM | POA: Diagnosis not present

## 2017-03-13 DIAGNOSIS — R399 Unspecified symptoms and signs involving the genitourinary system: Secondary | ICD-10-CM | POA: Diagnosis not present

## 2017-03-13 LAB — POCT URINALYSIS DIP (MANUAL ENTRY)
Bilirubin, UA: NEGATIVE
GLUCOSE UA: NEGATIVE mg/dL
Ketones, POC UA: NEGATIVE mg/dL
NITRITE UA: NEGATIVE
PH UA: 6 (ref 5.0–8.0)
Protein Ur, POC: NEGATIVE mg/dL
SPEC GRAV UA: 1.01 (ref 1.010–1.025)
UROBILINOGEN UA: 0.2 U/dL

## 2017-03-13 LAB — POC MICROSCOPIC URINALYSIS (UMFC): MUCUS RE: ABSENT

## 2017-03-13 MED ORDER — CEFTRIAXONE SODIUM 1 G IJ SOLR
1.0000 g | Freq: Once | INTRAMUSCULAR | Status: AC
  Administered 2017-03-13: 1 g via INTRAMUSCULAR

## 2017-03-13 MED ORDER — CIPROFLOXACIN HCL 500 MG PO TABS: 500.0000 mg | ORAL_TABLET | Freq: Two times a day (BID) | ORAL | 0 refills | Status: DC

## 2017-03-13 MED FILL — TAMSULOSIN HCL 0.4 MG CAP: 0.4 | 30 days supply | Qty: 30 | Fill #2

## 2017-03-13 MED FILL — CIPROFLOXACIN HCL 500 MG TA: 500 | 10 days supply | Qty: 20 | Fill #0

## 2017-03-13 NOTE — Progress Notes (Signed)
Zachary Jacobs 61 y.o.   Chief Complaint  Patient presents with  . urinary problem    per patient cloudy urine x 1 week  . Back Pain    lower LEFT     HISTORY OF PRESENT ILLNESS: This is a 61 y.o. male with h/o kidney stones, may have passed one recently followed by cloudy smelly urine. Has h/o UTI's.  Urinary Tract Infection   This is a new problem. The current episode started in the past 7 days. The problem has been gradually worsening. The quality of the pain is described as aching and burning. The pain is mild. There has been no fever. There is no history of pyelonephritis. Associated symptoms include chills, frequency, nausea and vomiting. Pertinent negatives include no hematuria. He has tried nothing for the symptoms. His past medical history is significant for kidney stones and recurrent UTIs.     Prior to Admission medications   Medication Sig Start Date End Date Taking? Authorizing Provider  albuterol (PROVENTIL HFA;VENTOLIN HFA) 108 (90 BASE) MCG/ACT inhaler Inhale 2 puffs into the lungs every 6 (six) hours as needed for wheezing or shortness of breath. 07/29/14  Yes Copland, Gay Filler, MD  budesonide-formoterol (SYMBICORT) 80-4.5 MCG/ACT inhaler Inhale 2 puffs into the lungs 2 (two) times daily. 12/22/16  Yes Tereasa Coop, PA-C  tamsulosin (FLOMAX) 0.4 MG CAPS capsule Take 0.4 mg by mouth.   Yes [provider]  albuterol (PROVENTIL) (2.5 MG/3ML) 0.083% nebulizer solution Take 3 mLs (2.5 mg total) by nebulization every 6 (six) hours as needed for wheezing or shortness of breath. Patient not taking: Reported on 03/06/2017 12/22/16   Tereasa Coop, PA-C  bisacodyl (DULCOLAX) 5 MG EC tablet Take 5 mg by mouth once. X 4 for colon 11-9    [provider]  Probiotic Product (ALIGN) 4 MG CAPS Take by mouth daily.    [provider]    No Known Allergies  Patient Active Problem List   Diagnosis Date Noted  . Staghorn renal calculus 03/20/2015  .  Hydronephrosis     Past Medical History:  Diagnosis Date  . Allergy   . Anemia    as child  . COPD, mild ([place])   . History of kidney stones   . OSA (obstructive sleep apnea)    s/p  surgery 1996  . Renal calculus, left   . Renal cyst, right    SIMPLE  . Sleep apnea    wears cpap     Past Surgical History:  Procedure Laterality Date  . CYSTO/  RIGHT URETEROSCOPY/  STENT PLACEMENT  10/ 2001  . HOLMIUM LASER APPLICATION Left 62/09/9483   Performed by Kathie Rhodes, MD at [facility]  . LEFT URETEROSCOPY/HOLMIUM LASER LITHOTRIPSY/STENT PLACEMENT, WITH  STONE BASKET EXTRACTION Left 03/30/2015   Performed by Kathie Rhodes, MD at [facility]  . NEPHROLITHOTOMY PERCUTANEOUS Left 03/20/2015   Performed by Kathie Rhodes, MD at [facility]  . TONSILLECTOMY     as child  . UVULOPALATOPHARYNGOPLASTY  1996   nasal surgery    Social History   Socioeconomic History  . Marital status: Married    Spouse name: Not on file  . Number of children: Not on file  . Years of education: Not on file  . Highest education level: Not on file  Social Needs  . Financial resource strain: Not on file  . Food insecurity - worry: Not on file  . Food insecurity - inability:  Not on file  . Transportation needs - medical: Not on file  . Transportation needs - non-medical: Not on file  Occupational History  . Not on file  Tobacco Use  . Smoking status: Former Smoker    Packs/day: 1.00    Years: 20.00    Pack years: 20.00    Types: Cigarettes    Last attempt to quit: 03/27/1984    Years since quitting: 32.9  . Smokeless tobacco: Never Used  Substance and Sexual Activity  . Alcohol use: No  . Drug use: No  . Sexual activity: Not on file  Other Topics Concern  . Not on file  Social History Narrative  . Not on file    Family History  Problem Relation Age of Onset  . Cancer Mother   . Heart disease Father   . Colon polyps Father   . Cancer Other   . Obesity Other     . Colon cancer Neg Hx   . Esophageal cancer Neg Hx   . Rectal cancer Neg Hx   . Stomach cancer Neg Hx      Review of Systems  Constitutional: Positive for chills. Negative for fever.  HENT: Negative.  Negative for sore throat.   Eyes: Negative.  Negative for discharge and redness.  Respiratory: Negative.  Negative for cough and shortness of breath.   Cardiovascular: Negative.  Negative for chest pain and palpitations.  Gastrointestinal: Positive for nausea and vomiting. Negative for abdominal pain, blood in stool and diarrhea.  Genitourinary: Positive for frequency. Negative for hematuria.  Musculoskeletal: Negative for back pain, myalgias and neck pain.  Skin: Negative for rash.  Neurological: Negative.  Negative for dizziness and headaches.  Endo/Heme/Allergies: Negative.   All other systems reviewed and are negative.   Vitals:   03/13/17 1155  BP: 116/64  Pulse: 63  Resp: 16  Temp: 98.5 F (36.9 C)  SpO2: 95%    Physical Exam  Constitutional: He is oriented to person, place, and time. He appears well-developed and well-nourished.  HENT:  Head: Normocephalic and atraumatic.  Right Ear: External ear normal.  Left Ear: External ear normal.  Nose: Nose normal.  Mouth/Throat: Oropharynx is clear and moist.  Eyes: Conjunctivae and EOM are normal. Pupils are equal, round, and reactive to light.  Neck: Normal range of motion. Neck supple. No thyromegaly present.  Cardiovascular: Normal rate, regular rhythm, normal heart sounds and intact distal pulses.  Pulmonary/Chest: Effort normal and breath sounds normal.  Abdominal: Soft. Bowel sounds are normal. He exhibits no distension. There is no tenderness.  Musculoskeletal: Normal range of motion.  Lymphadenopathy:    He has no cervical adenopathy.  Neurological: He is alert and oriented to person, place, and time. No sensory deficit. He exhibits normal muscle tone.  Skin: Skin is warm and dry. Capillary refill takes less  than 2 seconds. No rash noted.  Psychiatric: He has a normal mood and affect. His behavior is normal.  Vitals reviewed.   Results for orders placed or performed in visit on 03/13/17 (from the past 24 hour(s))  POCT Microscopic Urinalysis ([facility])     Status: Abnormal   Collection Time: 03/13/17 12:34 PM  Result Value Ref Range   WBC,UR,HPF,POC Many (A) None WBC/hpf   RBC,UR,HPF,POC Few (A) None RBC/hpf   Bacteria None None, Too numerous to count   Mucus Absent Absent   Epithelial Cells, UR Per Microscopy None None, Too numerous to count cells/hpf  POCT urinalysis dipstick  Status: Abnormal   Collection Time: 03/13/17 12:34 PM  Result Value Ref Range   Color, UA yellow yellow   Clarity, UA clear clear   Glucose, UA negative negative mg/dL   Bilirubin, UA negative negative   Ketones, POC UA negative negative mg/dL   Spec Grav, UA 1.010 1.010 - 1.025   Blood, UA small (A) negative   pH, UA 6.0 5.0 - 8.0   Protein Ur, POC negative negative mg/dL   Urobilinogen, UA 0.2 0.2 or 1.0 E.U./dL   Nitrite, UA Negative Negative   Leukocytes, UA Large (3+) (A) Negative    ASSESSMENT & PLAN: Kethan was seen today for urinary problem and back pain.  Diagnoses and all orders for this visit:  Lower urinary tract symptoms (LUTS) -     Urine Culture -     POCT Microscopic Urinalysis ([facility]) -     POCT urinalysis dipstick -     cefTRIAXone (ROCEPHIN) injection 1 g  Acute UTI  History of kidney stones  Other orders -     ciprofloxacin (CIPRO) 500 MG tablet; Take 1 tablet (500 mg total) 2 (two) times daily by mouth.    Patient Instructions       IF you received an x-ray today, you will receive an invoice from [facility]. Please contact [facility] at [phone] with questions or concerns regarding your invoice.   IF you received labwork today, you will receive an invoice from [place]. Please contact [facility] at [phone] with questions or concerns regarding  your invoice.   Our billing staff will not be able to assist you with questions regarding bills from these companies.  You will be contacted with the lab results as soon as they are available. The fastest way to get your results is to activate your My Chart account. Instructions are located on the last page of this paperwork. If you have not heard from [place] regarding the results in 2 weeks, please contact this office.     Urinary Tract Infection, Adult A urinary tract infection (UTI) is an infection of any part of the urinary tract. The urinary tract includes the:  Kidneys.  Ureters.  Bladder.  Urethra.  These organs make, store, and get rid of pee (urine) in the body. Follow these instructions at home:  Take over-the-counter and prescription medicines only as told by your doctor.  If you were prescribed an antibiotic medicine, take it as told by your doctor. Do not stop taking the antibiotic even if you start to feel better.  Avoid the following drinks: ? Alcohol. ? Caffeine. ? Tea. ? Carbonated drinks.  Drink enough fluid to keep your pee clear or pale yellow.  Keep all follow-up visits as told by your doctor. This is important.  Make sure to: ? Empty your bladder often and completely. Do not to hold pee for long periods of time. ? Empty your bladder before and after sex. ? Wipe from front to back after a bowel movement if you are male. Use each tissue one time when you wipe. Contact a doctor if:  You have back pain.  You have a fever.  You feel sick to your stomach (nauseous).  You throw up (vomit).  Your symptoms do not get better after 3 days.  Your symptoms go away and then come back. Get help right away if:  You have very bad back pain.  You have very bad lower belly (abdominal) pain.  You are throwing up and cannot keep down  any medicines or water. This information is not intended to replace advice given to you by your health care provider. Make  sure you discuss any questions you have with your health care provider. Document Released: 10/01/2007 Document Revised: 09/20/2015 Document Reviewed: 03/05/2015 Elsevier Interactive Patient Education  2018 Elsevier Inc.      Agustina Caroli, MD [facility]

## 2017-03-13 NOTE — Patient Instructions (Addendum)
     IF you received an x-ray today, you will receive an invoice from [facility]. Please contact [facility] at [phone] with questions or concerns regarding your invoice.   IF you received labwork today, you will receive an invoice from [facility]. Please contact [facility] at [phone] with questions or concerns regarding your invoice.   Our billing staff will not be able to assist you with questions regarding bills from these companies.  You will be contacted with the lab results as soon as they are available. The fastest way to get your results is to activate your My Chart account. Instructions are located on the last page of this paperwork. If you have not heard from us regarding the results in 2 weeks, please contact this office.     Urinary Tract Infection, Adult A urinary tract infection (UTI) is an infection of any part of the urinary tract. The urinary tract includes the:  Kidneys.  Ureters.  Bladder.  Urethra.  These organs make, store, and get rid of pee (urine) in the body. Follow these instructions at home:  Take over-the-counter and prescription medicines only as told by your doctor.  If you were prescribed an antibiotic medicine, take it as told by your doctor. Do not stop taking the antibiotic even if you start to feel better.  Avoid the following drinks: ? Alcohol. ? Caffeine. ? Tea. ? Carbonated drinks.  Drink enough fluid to keep your pee clear or pale yellow.  Keep all follow-up visits as told by your doctor. This is important.  Make sure to: ? Empty your bladder often and completely. Do not to hold pee for long periods of time. ? Empty your bladder before and after sex. ? Wipe from front to back after a bowel movement if you are male. Use each tissue one time when you wipe. Contact a doctor if:  You have back pain.  You have a fever.  You feel sick to your stomach (nauseous).  You throw up (vomit).  Your symptoms do  not get better after 3 days.  Your symptoms go away and then come back. Get help right away if:  You have very bad back pain.  You have very bad lower belly (abdominal) pain.  You are throwing up and cannot keep down any medicines or water. This information is not intended to replace advice given to you by your health care provider. Make sure you discuss any questions you have with your health care provider. Document Released: 10/01/2007 Document Revised: 09/20/2015 Document Reviewed: 03/05/2015 Elsevier Interactive Patient Education  2018 Elsevier Inc.  

## 2017-03-14 LAB — URINE CULTURE: Organism ID, Bacteria: NO GROWTH

## 2017-04-15 MED FILL — TAMSULOSIN HCL 0.4 MG CAP: 0.4 | 30 days supply | Qty: 30 | Fill #3

## 2017-05-18 ENCOUNTER — Ambulatory Visit (INDEPENDENT_AMBULATORY_CARE_PROVIDER_SITE_OTHER): Payer: 59 | Admitting: Physician Assistant

## 2017-05-18 ENCOUNTER — Encounter: Payer: Self-pay | Admitting: Physician Assistant

## 2017-05-18 VITALS — BP 126/78 | HR 78 | Temp 98.1°F | Resp 16 | Ht 69.0 in | Wt 280.0 lb

## 2017-05-18 DIAGNOSIS — R5381 Other malaise: Secondary | ICD-10-CM | POA: Diagnosis not present

## 2017-05-18 DIAGNOSIS — R300 Dysuria: Secondary | ICD-10-CM | POA: Diagnosis not present

## 2017-05-18 DIAGNOSIS — N200 Calculus of kidney: Secondary | ICD-10-CM | POA: Diagnosis not present

## 2017-05-18 LAB — POCT URINALYSIS DIP (MANUAL ENTRY)
BILIRUBIN UA: NEGATIVE
BILIRUBIN UA: NEGATIVE mg/dL
Glucose, UA: NEGATIVE mg/dL
Leukocytes, UA: NEGATIVE
Nitrite, UA: NEGATIVE
PH UA: 5.5 (ref 5.0–8.0)
Protein Ur, POC: 100 mg/dL — AB
SPEC GRAV UA: 1.025 (ref 1.010–1.025)
Urobilinogen, UA: 0.2 E.U./dL

## 2017-05-18 MED ORDER — ONDANSETRON 4 MG PO TBDP
4.0000 mg | ORAL_TABLET | Freq: Once | ORAL | Status: AC
  Administered 2017-05-18: 4 mg via ORAL

## 2017-05-18 MED ORDER — ONDANSETRON HCL 4 MG/2ML IJ SOLN: 2.0000 mg | Freq: Once | INTRAMUSCULAR | Status: DC

## 2017-05-18 MED ORDER — KETOROLAC TROMETHAMINE 60 MG/2ML IM SOLN
60.0000 mg | Freq: Once | INTRAMUSCULAR | Status: AC
  Administered 2017-05-18: 60 mg via INTRAMUSCULAR

## 2017-05-18 MED FILL — TAMSULOSIN HCL 0.4 MG CAP: 0.4 | 30 days supply | Qty: 30 | Fill #4

## 2017-05-18 NOTE — Progress Notes (Signed)
05/18/2017 12:48 PM   DOB: [birth year] / MRN: [id number]  SUBJECTIVE:  Zachary Jacobs is a 62 y.o. male presenting for dysuria and nausea.  He has a history of kidney stones.  He does have urologist.  He associates some diarrhea today.  Last PSA was 0.95 months ago.  His last stone analysis was comprised of calcium monohydrate oxalate at 55% and calcium dihydrate oxalate at 15%.  Uric acid was 5% and calcium phosphate was 25%.  He did follow-up with urology for this test result and was advised to make some diet changes.  He is a former smoker but quit 33 years ago.  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   He denies fever, flank pain, constipation, dizziness, chest pain, shortness of breath, leg swelling.  He does feel weak and very tired.  Tells me he has not been drinking a lot of fluids over the last 2-3 days since this problem started.    Last colonoscopy was negative.   Immunization History  Administered Date(s) Administered  . Influenza-Unspecified 01/05/2015  . Pneumococcal Conjugate-13 12/22/2016    He has No Known Allergies.   He  has a past medical history of Allergy, Anemia, COPD, mild ([place]), History of kidney stones, OSA (obstructive sleep apnea), Renal calculus, left, Renal cyst, right, and Sleep apnea.    He  reports that he quit smoking about 33 years ago. His smoking use included cigarettes. He has a 20.00 pack-year smoking history. he has never used smokeless tobacco. He reports that he does not drink alcohol or use drugs. He  has no sexual activity history on file. The patient  has a past surgical history that includes Nephrolithotomy (Left, 03/20/2015); CYSTO/  RIGHT URETEROSCOPY/  STENT PLACEMENT (10/ 2001); Uvulopalatopharyngoplasty (1996); Cystoscopy/ureteroscopy/holmium laser/stent placement (Left, 03/30/2015); Holmium laser application (Left, 40/12/8117); and Tonsillectomy.  His family history includes Cancer in his mother and other; Colon polyps  in his father; Heart disease in his father; Obesity in his other.  ROS   Per HPI.  The problem list and medications were reviewed and updated by myself where necessary and exist elsewhere in the encounter.   OBJECTIVE:  BP 126/78 (BP Location: Right Arm, Patient Position: Sitting, Cuff Size: Large)   Pulse 78   Temp 98.1 F (36.7 C) (Oral)   Resp 16   Ht 5\' 9"  (1.753 m)   Wt 280 lb (127 kg)   SpO2 94%   BMI 41.35 kg/m   Physical Exam  Constitutional: He appears well-developed. He is active and cooperative.  Non-toxic appearance.  Cardiovascular: Normal rate, regular rhythm, S1 normal, S2 normal, normal heart sounds, intact distal pulses and normal pulses. Exam reveals no gallop and no friction rub.  No murmur heard. Pulmonary/Chest: Effort normal. No stridor. No tachypnea. No respiratory distress. He has no wheezes. He has no rales.  Abdominal: Soft. Normal appearance and bowel sounds are normal. He exhibits no distension and no mass. There is no tenderness. There is no rigidity, no rebound, no guarding and no CVA tenderness. No hernia.  Musculoskeletal: He exhibits no edema.  Neurological: He is alert.  Skin: Skin is warm and dry. He is not diaphoretic. No pallor.  Vitals reviewed.   Results for orders placed or performed in visit on 05/18/17 (from the past 72 hour(s))  POCT urinalysis dipstick     Status: Abnormal   Collection Time: 05/18/17 11:27 AM  Result Value Ref Range   Color, UA yellow yellow  Clarity, UA clear clear   Glucose, UA negative negative mg/dL   Bilirubin, UA negative negative   Ketones, POC UA negative negative mg/dL   Spec Grav, UA 1.025 1.010 - 1.025   Blood, UA large (A) negative   pH, UA 5.5 5.0 - 8.0   Protein Ur, POC =100 (A) negative mg/dL   Urobilinogen, UA 0.2 0.2 or 1.0 E.U./dL   Nitrite, UA Negative Negative   Leukocytes, UA Negative Negative    No results found.  ASSESSMENT AND PLAN:  Jair was seen today for dysuria and  nausea.  Diagnoses and all orders for this visit:  Dysuria -     POCT urinalysis dipstick -     Urine Culture -     ondansetron (ZOFRAN-ODT) disintegrating tablet 4 mg  Nephrolithiasis: He improved dramatically with 60 of Toradol and 4 of Zofran under the tongue,  along with 1 L of fluids.  He will call on Thursday if he is not feeling better so I can order a CT scan to hopefully be done on Friday.  He will call sooner if he is getting worse.  He will schedule 600-800 mg of ibuprofen every 8 hours and double up on his Flomax for now.  I will change his therapies if his urine grows out bacteria.  He will call his urologist and try to get his appointment moved up. -     Discontinue: ondansetron (ZOFRAN) injection 2 mg -     ketorolac (TORADOL) injection 60 mg -     ondansetron (ZOFRAN-ODT) disintegrating tablet 4 mg  Malaise -     ondansetron (ZOFRAN-ODT) disintegrating tablet 4 mg    The patient is advised to call or return to clinic if he does not see an improvement in symptoms, or to seek the care of the closest emergency department if he worsens with the above plan.   Philis Fendt, MHS, PA-C [facility] 05/18/2017 12:48 PM

## 2017-05-18 NOTE — Patient Instructions (Addendum)
If you are not getting better by Thursday then please my chart so I can order a CT scan so we can check out the size of the stones.  I have a urine culture pending and will treat with antibiotics if that comes back positive.  It is okay to double your Flomax for now.  Please try to get your urology follow-up moved up.  If at any point you become worse than please my chart me sooner, come back to the clinic or go to the emergency department if you feel that you need to.  Please try to cut the sugar out of your diet if you are prediabetic.    IF you received an x-ray today, you will receive an invoice from [facility]. Please contact [facility] at [phone] with questions or concerns regarding your invoice.   IF you received labwork today, you will receive an invoice from [place]. Please contact [facility] at [phone] with questions or concerns regarding your invoice.   Our billing staff will not be able to assist you with questions regarding bills from these companies.  You will be contacted with the lab results as soon as they are available. The fastest way to get your results is to activate your My Chart account. Instructions are located on the last page of this paperwork. If you have not heard from [place] regarding the results in 2 weeks, please contact this office.

## 2017-05-19 LAB — URINE CULTURE: ORGANISM ID, BACTERIA: NO GROWTH

## 2017-05-21 ENCOUNTER — Encounter: Payer: Self-pay | Admitting: Physician Assistant

## 2017-05-27 DIAGNOSIS — R3914 Feeling of incomplete bladder emptying: Secondary | ICD-10-CM | POA: Diagnosis not present

## 2017-05-27 DIAGNOSIS — R8271 Bacteriuria: Secondary | ICD-10-CM | POA: Diagnosis not present

## 2017-06-01 MED FILL — TAMSULOSIN HCL 0.4 MG CAP: 0.4 | 30 days supply | Qty: 60 | Fill #0

## 2017-06-02 ENCOUNTER — Encounter: Payer: Self-pay | Admitting: Physician Assistant

## 2017-07-14 MED FILL — TAMSULOSIN HCL 0.4 MG CAP: 0.4 | 30 days supply | Qty: 60 | Fill #1

## 2017-08-08 ENCOUNTER — Encounter (HOSPITAL_COMMUNITY): Payer: Self-pay | Admitting: *Deleted

## 2017-08-08 ENCOUNTER — Other Ambulatory Visit: Payer: Self-pay

## 2017-08-08 ENCOUNTER — Ambulatory Visit (HOSPITAL_COMMUNITY)
Admission: EM | Admit: 2017-08-08 | Discharge: 2017-08-08 | Disposition: A | Discharge status: Home / Self Care | Payer: 59 | Attending: Family Medicine | Admitting: Family Medicine

## 2017-08-08 DIAGNOSIS — R300 Dysuria: Secondary | ICD-10-CM

## 2017-08-08 DIAGNOSIS — Z87891 Personal history of nicotine dependence: Secondary | ICD-10-CM | POA: Diagnosis not present

## 2017-08-08 DIAGNOSIS — J449 Chronic obstructive pulmonary disease, unspecified: Secondary | ICD-10-CM | POA: Diagnosis not present

## 2017-08-08 DIAGNOSIS — Z87442 Personal history of urinary calculi: Secondary | ICD-10-CM | POA: Diagnosis not present

## 2017-08-08 DIAGNOSIS — G4733 Obstructive sleep apnea (adult) (pediatric): Secondary | ICD-10-CM | POA: Insufficient documentation

## 2017-08-08 DIAGNOSIS — Z8249 Family history of ischemic heart disease and other diseases of the circulatory system: Secondary | ICD-10-CM | POA: Diagnosis not present

## 2017-08-08 DIAGNOSIS — Z8371 Family history of colonic polyps: Secondary | ICD-10-CM | POA: Insufficient documentation

## 2017-08-08 DIAGNOSIS — N3090 Cystitis, unspecified without hematuria: Secondary | ICD-10-CM | POA: Insufficient documentation

## 2017-08-08 MED ORDER — ONDANSETRON 4 MG PO TBDP: 4.0000 mg | ORAL_TABLET | Freq: Three times a day (TID) | ORAL | 0 refills | Status: DC | PRN

## 2017-08-08 MED ORDER — DOXYCYCLINE HYCLATE 100 MG PO CAPS: 100.0000 mg | ORAL_CAPSULE | Freq: Two times a day (BID) | ORAL | 0 refills | Status: DC

## 2017-08-08 NOTE — ED Provider Notes (Signed)
[facility]    ASSESSMENT & PLAN:  1. Cystitis     Meds ordered this encounter  Medications  . doxycycline (VIBRAMYCIN) 100 MG capsule    Sig: Take 1 capsule (100 mg total) by mouth 2 (two) times daily.    Dispense:  14 capsule    Refill:  0  . ondansetron (ZOFRAN-ODT) 4 MG disintegrating tablet    Sig: Take 1 tablet (4 mg total) by mouth every 8 (eight) hours as needed for nausea or vomiting.    Dispense:  15 tablet    Refill:  0   Reports doxycycline worked with his last UTI. Is followed by urology. Urine culture sent. Will notify patient of any positive results. Will follow up with his PCP or here if not showing improvement over the next 48 hours, sooner if needed.  Outlined signs and symptoms indicating need for more acute intervention. Patient verbalized understanding. After Visit Summary given.  SUBJECTIVE:  Zachary Jacobs is a 62 y.o. male who complains of dysuria for the past 6-7 days. H/O UTI and this feels the same. No flank pain, fever, chills, abnormal penile discharge or bleeding. Hematuria: not present. Normal PO intake but with slight nausea. No abdominal pain. No self treatment. Ambulatory without difficulty.  ROS: As in HPI.  OBJECTIVE:  Vitals:   08/08/17 1222  BP: 121/72  Pulse: 65  Resp: 16  Temp: 98.5 F (36.9 C)  TempSrc: Oral  SpO2: 94%   Appears well, in no apparent distress. Abdomen unremarkable.  Labs Reviewed  URINE CULTURE    No Known Allergies  Past Medical History:  Diagnosis Date  . Allergy   . Anemia    as child  . COPD, mild ([place])   . History of kidney stones   . OSA (obstructive sleep apnea)    s/p  surgery 1996  . Renal calculus, left   . Renal cyst, right    SIMPLE  . Sleep apnea    wears cpap    Social History   Socioeconomic History  . Marital status: Married    Spouse name: Not on file  . Number of children: Not on file  . Years of education: Not on file  . Highest education level: Not on  file  Occupational History  . Not on file  Social Needs  . Financial resource strain: Not on file  . Food insecurity:    Worry: Not on file    Inability: Not on file  . Transportation needs:    Medical: Not on file    Non-medical: Not on file  Tobacco Use  . Smoking status: Former Smoker    Packs/day: 1.00    Years: 20.00    Pack years: 20.00    Types: Cigarettes    Last attempt to quit: 03/27/1984    Years since quitting: 33.3  . Smokeless tobacco: Never Used  Substance and Sexual Activity  . Alcohol use: No  . Drug use: No  . Sexual activity: Not on file  Lifestyle  . Physical activity:    Days per week: Not on file    Minutes per session: Not on file  . Stress: Not on file  Relationships  . Social connections:    Talks on phone: Not on file    Gets together: Not on file    Attends religious service: Not on file    Active member of club or organization: Not on file    Attends meetings of clubs or  organizations: Not on file    Relationship status: Not on file  . Intimate partner violence:    Fear of current or ex partner: Not on file    Emotionally abused: Not on file    Physically abused: Not on file    Forced sexual activity: Not on file  Other Topics Concern  . Not on file  Social History Narrative  . Not on file   Family History  Problem Relation Age of Onset  . Cancer Mother   . Heart disease Father   . Colon polyps Father   . Cancer Other   . Obesity Other   . Colon cancer Neg Hx   . Esophageal cancer Neg Hx   . Rectal cancer Neg Hx   . Stomach cancer Neg Hx        Vanessa Kick, MD 08/08/17 1249

## 2017-08-08 NOTE — ED Triage Notes (Signed)
C/O dysuria x approx 1 wk.  Unsure if polyuria.  Has had intermittent difficulty emptying bladder; no difficulty today.  No known fevers.  Has had nausea.

## 2017-08-10 LAB — URINE CULTURE: Culture: >=100000 — AB

## 2017-08-11 ENCOUNTER — Telehealth (HOSPITAL_COMMUNITY): Payer: Self-pay

## 2017-08-11 NOTE — Telephone Encounter (Signed)
Urine culture is positive for lactobacillus germ, which is part of the normal urogenital germ flora and does not represent a UTI. Attempted to reach patient, no answer and voicemail is not set up.

## 2017-08-11 NOTE — Telephone Encounter (Signed)
Pt returned phone call. Verbalized understanding of results. Reports feeling better.

## 2017-09-01 MED FILL — TAMSULOSIN HCL 0.4 MG CAP: 0.4 | 30 days supply | Qty: 60 | Fill #2

## 2017-10-23 MED FILL — SYMBICORT 80-4.5 MCG INH: 80-4.5 | 30 days supply | Qty: 10 | Fill #2

## 2017-10-23 MED FILL — TAMSULOSIN HCL 0.4 MG CAP: 0.4 | 30 days supply | Qty: 60 | Fill #3

## 2017-11-09 IMAGING — XA IR NEPHROSTOGRAM INI PLACEMENT LEFT
1 series · 6 of 6 positions shown · non-contrast
Comparison: none

CLINICAL DATA: Left nephrolithiasis with obstructing calculus, mild
hydronephrosis, chills, possible pyonephrosis.

EXAM:
LEFT PERCUTANEOUS NEPHROSTOMY CATHETER PLACEMENT UNDER FLUOROSCOPIC
GUIDANCE
FLUOROSCOPY TIME:  9 minutes 6 sec, 45 mGy
TECHNIQUE: The procedure, risks (including but not limited to bleeding,
infection, organ damage ), benefits, and alternatives were explained
to the patient. Questions regarding the procedure were encouraged
and answered. The patient understands and consents to the procedure.

[Series 300: ir nephrostogram left initial placement · 6 of 6 slices shown]
[im 1/6]
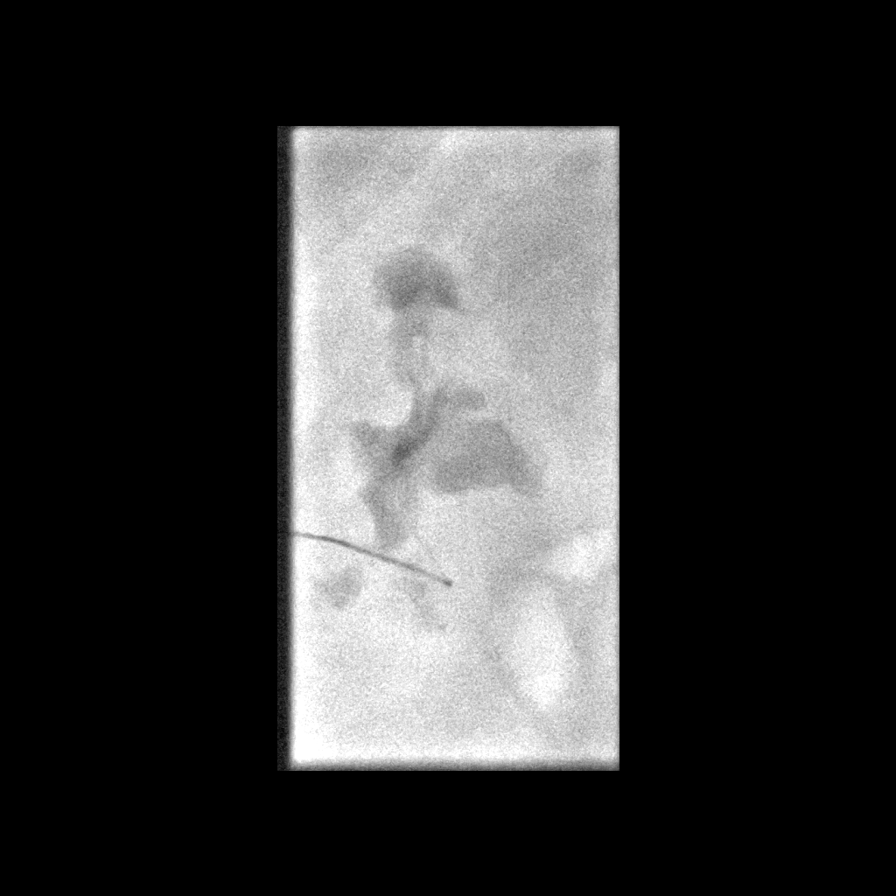
[im 2/6]
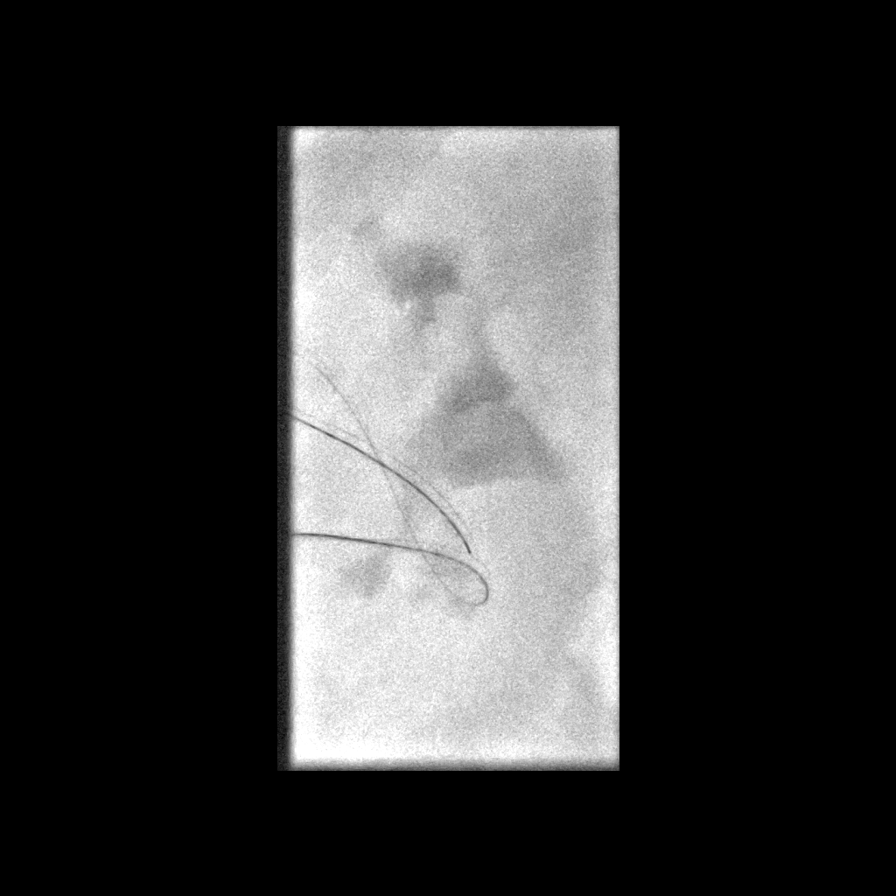
[im 3/6]
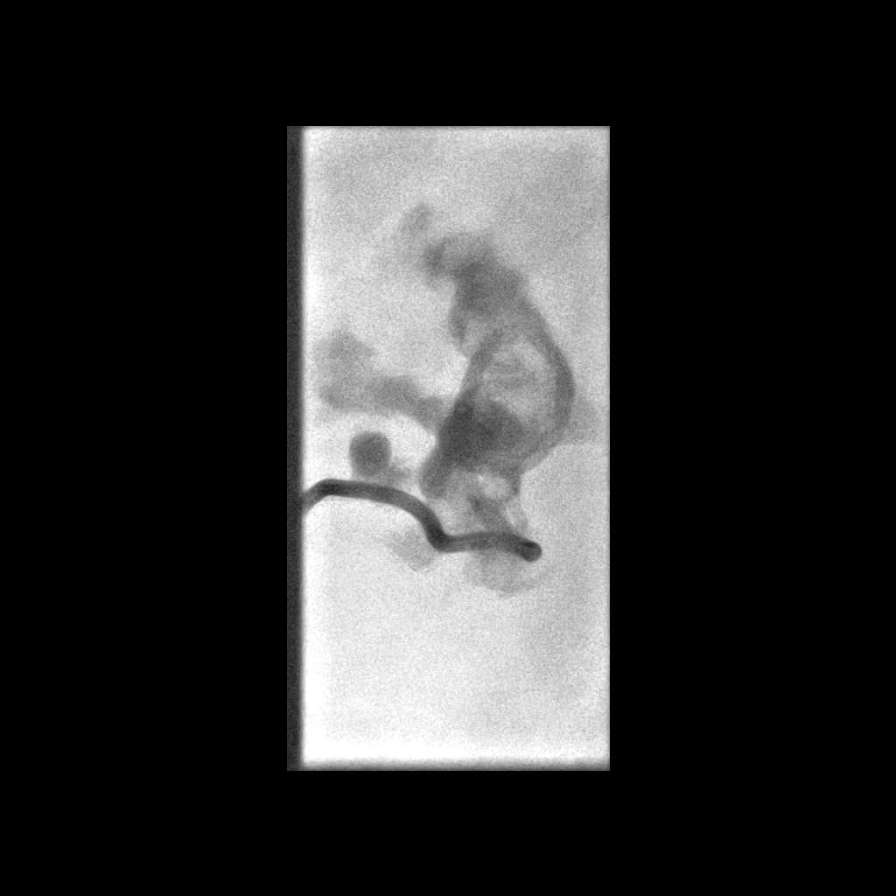
[im 4/6]
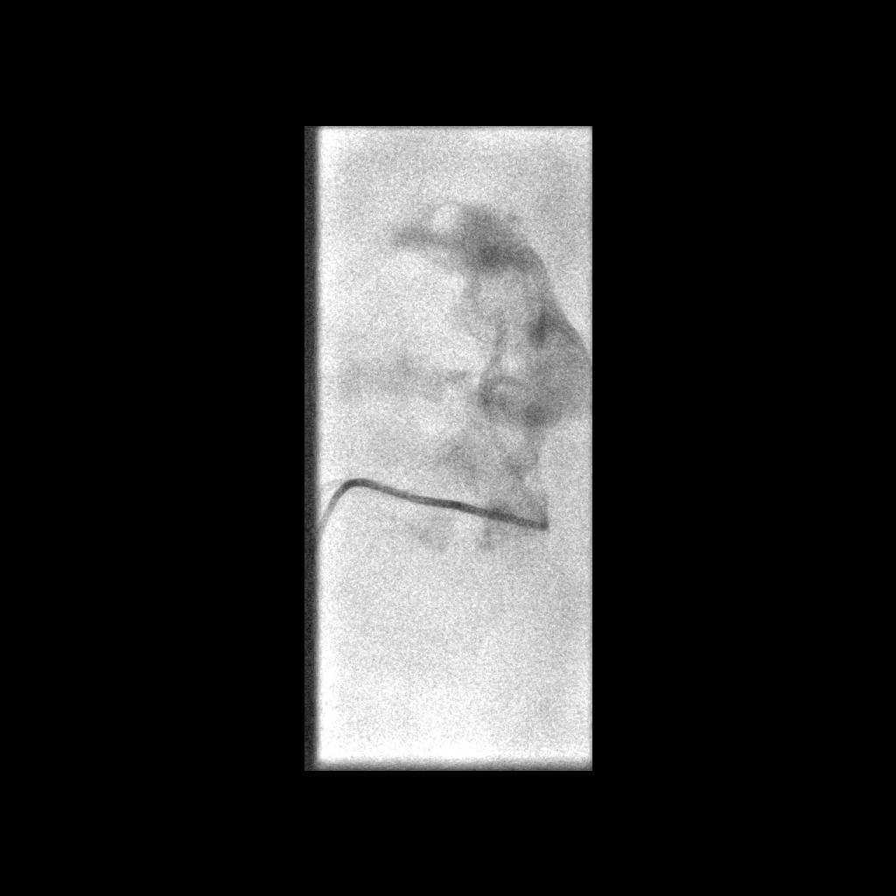
[im 5/6]
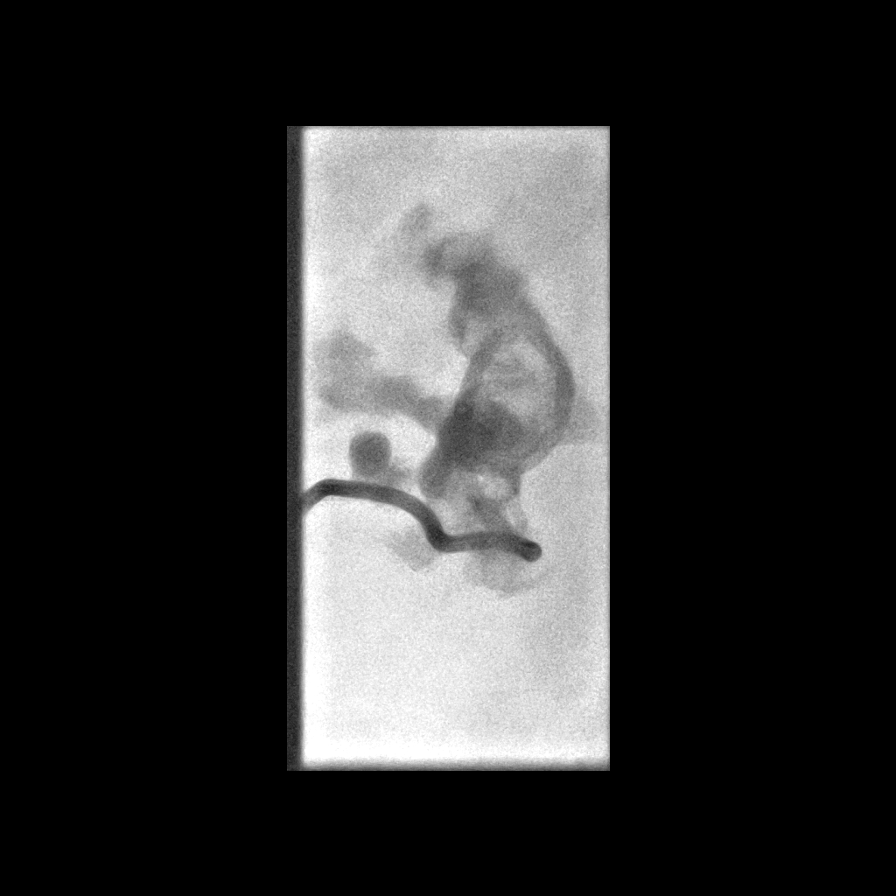
[im 6/6]
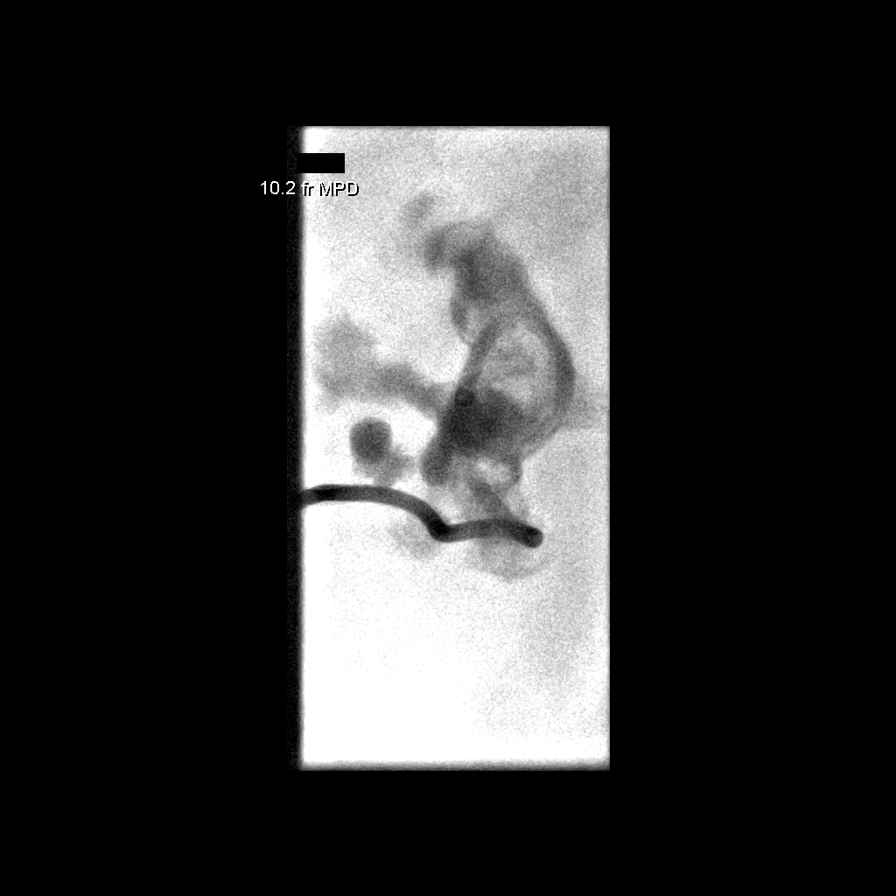

[6 of 6 positions shown; findings below may reference images not displayed]

LeftFlank region prepped with Betadine, draped in usual sterile
fashion, infiltrated locally with 1% lidocaine.As antibiotic
prophylaxis, Cipro 400 mg was ordered pre-procedure and administered
intravenously within one hour of incision.

Intravenous Fentanyl and Versed were administered as conscious
sedation during continuous cardiorespiratory monitoring by the
radiology RN, with a total moderate sedation time of 33 minutes.

Using posterior lower pole radiodense calculus as a guide, a
21-gauge trocar needle was advanced into a posterior lower pole
calyx. Purulent urine could be aspirated. Needle was exchanged over
a guidewire for transitional dilator. Contrast injection confirmed
appropriate positioning. An angled Glidewire and short [place] the
catheter allowed access to the central aspect of the renal
collecting system. Catheter was exchanged over a guidewire for a 10
French pigtail catheter, formed centrally within the left renal
collecting system. Contrast injection confirms appropriate
positioning and patency. Catheter secured externally with 0 Prolene
suture and placed to external drain bag.

COMPLICATIONS:
COMPLICATIONS
none
IMPRESSION: 1. Technically successful left percutaneous nephrostomy catheter
placement.
A sample of the purulent aspirate sent for Gram stain, culture and
sensitivity.

## 2018-01-04 MED FILL — TAMSULOSIN HCL 0.4 MG CAP: 0.4 | 30 days supply | Qty: 60 | Fill #4

## 2018-02-24 MED FILL — TAMSULOSIN HCL 0.4 MG CAP: 0.4 | 30 days supply | Qty: 60 | Fill #5

## 2018-03-01 DIAGNOSIS — H5203 Hypermetropia, bilateral: Secondary | ICD-10-CM | POA: Diagnosis not present

## 2018-04-19 MED FILL — TAMSULOSIN HCL 0.4 MG CAP: 0.4 | 30 days supply | Qty: 60 | Fill #6

## 2018-07-15 MED FILL — TAMSULOSIN HCL 0.4 MG CAP: 0.4 | 30 days supply | Qty: 60 | Fill #0

## 2018-10-21 MED FILL — TAMSULOSIN HCL 0.4 MG CAP: 0.4 | 15 days supply | Qty: 30 | Fill #0

## 2018-10-28 DIAGNOSIS — R8279 Other abnormal findings on microbiological examination of urine: Secondary | ICD-10-CM | POA: Diagnosis not present

## 2018-10-28 DIAGNOSIS — R8271 Bacteriuria: Secondary | ICD-10-CM | POA: Diagnosis not present

## 2018-10-28 DIAGNOSIS — N200 Calculus of kidney: Secondary | ICD-10-CM | POA: Diagnosis not present

## 2018-10-28 DIAGNOSIS — Z125 Encounter for screening for malignant neoplasm of prostate: Secondary | ICD-10-CM | POA: Diagnosis not present

## 2018-11-04 MED FILL — TAMSULOSIN HCL 0.4 MG CAP: 0.4 | 15 days supply | Qty: 30 | Fill #0

## 2018-11-23 MED FILL — TAMSULOSIN HCL 0.4 MG CAP: 0.4 | 30 days supply | Qty: 60 | Fill #0

## 2019-01-06 MED FILL — TAMSULOSIN HCL 0.4 MG CAP: 0.4 | 30 days supply | Qty: 60 | Fill #1

## 2019-02-19 ENCOUNTER — Telehealth: Payer: 59 | Admitting: Nurse Practitioner

## 2019-02-19 DIAGNOSIS — N3000 Acute cystitis without hematuria: Secondary | ICD-10-CM

## 2019-02-19 MED ORDER — CEPHALEXIN 500 MG PO CAPS: 500.0000 mg | ORAL_CAPSULE | Freq: Two times a day (BID) | ORAL | 0 refills | Status: DC

## 2019-02-19 NOTE — Progress Notes (Signed)

## 2019-02-24 MED FILL — TAMSULOSIN HCL 0.4 MG CAP: 0.4 | 30 days supply | Qty: 60 | Fill #2

## 2019-02-25 ENCOUNTER — Telehealth: Payer: 59 | Admitting: Physician Assistant

## 2019-02-25 DIAGNOSIS — R05 Cough: Secondary | ICD-10-CM

## 2019-02-25 DIAGNOSIS — R059 Cough, unspecified: Secondary | ICD-10-CM

## 2019-02-25 DIAGNOSIS — R062 Wheezing: Secondary | ICD-10-CM

## 2019-02-25 MED ORDER — PREDNISONE 5 MG PO TABS: ORAL_TABLET | ORAL | 0 refills | Status: DC

## 2019-02-25 MED ORDER — BENZONATATE 100 MG PO CAPS: 100.0000 mg | ORAL_CAPSULE | Freq: Three times a day (TID) | ORAL | 0 refills | Status: DC | PRN

## 2019-02-25 MED ORDER — ALBUTEROL SULFATE HFA 108 (90 BASE) MCG/ACT IN AERS: 2.0000 | INHALATION_SPRAY | RESPIRATORY_TRACT | 0 refills | Status: DC | PRN

## 2019-02-25 NOTE — Progress Notes (Signed)
We are sorry that you are not feeling well.  Here is how we plan to help!  Based on your presentation I believe you most likely have A cough due to a virus.  This is called viral bronchitis and is best treated by rest, plenty of fluids and control of the cough.  You may use Ibuprofen or Tylenol as directed to help your symptoms.    You may take plain mucinex to help clear your mucus.   In addition you may use A prescription cough medication called Tessalon Perles 100mg . You may take 1-2 capsules every 8 hours as needed for your cough.  Prednisone 5 mg daily for 6 days (see taper instructions below)  Directions for 6 day taper: Day 1: 2 tablets before breakfast, 1 after both lunch & dinner and 2 at bedtime Day 2: 1 tab before breakfast, 1 after both lunch & dinner and 2 at bedtime Day 3: 1 tab at each meal & 1 at bedtime Day 4: 1 tab at breakfast, 1 at lunch, 1 at bedtime Day 5: 1 tab at breakfast & 1 tab at bedtime Day 6: 1 tab at breakfast   From your responses in the eVisit questionnaire you describe inflammation in the upper respiratory tract which is causing a significant cough.  This is commonly called Bronchitis and has four common causes:    Allergies  Viral Infections  Acid Reflux  Bacterial Infection Allergies, viruses and acid reflux are treated by controlling symptoms or eliminating the cause. An example might be a cough caused by taking certain blood pressure medications. You stop the cough by changing the medication. Another example might be a cough caused by acid reflux. Controlling the reflux helps control the cough.  USE OF BRONCHODILATOR ("RESCUE") INHALERS: There is a risk from using your bronchodilator too frequently.  The risk is that over-reliance on a medication which only relaxes the muscles surrounding the breathing tubes can reduce the effectiveness of medications prescribed to reduce swelling and congestion of the tubes themselves.  Although you feel brief  relief from the bronchodilator inhaler, your asthma may actually be worsening with the tubes becoming more swollen and filled with mucus.  This can delay other crucial treatments, such as oral steroid medications. If you need to use a bronchodilator inhaler daily, several times per day, you should discuss this with your provider.  There are probably better treatments that could be used to keep your asthma under control.     HOME CARE . Only take medications as instructed by your medical team. . Complete the entire course of an antibiotic. . Drink plenty of fluids and get plenty of rest. . Avoid close contacts especially the very young and the elderly . Cover your mouth if you cough or cough into your sleeve. . Always remember to wash your hands . A steam or ultrasonic humidifier can help congestion.   GET HELP RIGHT AWAY IF: . You develop worsening fever. . You become short of breath . You cough up blood. . Your symptoms persist after you have completed your treatment plan MAKE SURE YOU   Understand these instructions.  Will watch your condition.  Will get help right away if you are not doing well or get worse.  Your e-visit answers were reviewed by a board certified advanced clinical practitioner to complete your personal care plan.  Depending on the condition, your plan could have included both over the counter or prescription medications. If there is a problem please reply  once you have received a response from your provider. Your safety is important to [place].  If you have drug allergies check your prescription carefully.    You can use MyChart to ask questions about today's visit, request a non-urgent call back, or ask for a work or school excuse for 24 hours related to this e-Visit. If it has been greater than 24 hours you will need to follow up with your provider, or enter a new e-Visit to address those concerns. You will get an e-mail in the next two days asking about your  experience.  I hope that your e-visit has been valuable and will speed your recovery. Thank you for using e-visits.  Greater than 5 minutes, yet less than 10 minutes of time have been spent researching, coordinating and implementing care for this patient today.

## 2019-03-08 DIAGNOSIS — H524 Presbyopia: Secondary | ICD-10-CM | POA: Diagnosis not present

## 2019-03-08 LAB — HM DIABETES EYE EXAM

## 2019-03-14 ENCOUNTER — Encounter: Payer: Self-pay | Admitting: *Deleted

## 2019-03-23 MED FILL — TAMSULOSIN HCL 0.4 MG CAP: 0.4 | 30 days supply | Qty: 60 | Fill #3

## 2019-03-28 ENCOUNTER — Telehealth: Payer: 59 | Admitting: Physician Assistant

## 2019-03-28 DIAGNOSIS — N3000 Acute cystitis without hematuria: Secondary | ICD-10-CM

## 2019-03-28 MED ORDER — CEPHALEXIN 500 MG PO CAPS: 500.0000 mg | ORAL_CAPSULE | Freq: Two times a day (BID) | ORAL | 0 refills | Status: AC

## 2019-03-28 NOTE — Progress Notes (Signed)

## 2019-04-07 DIAGNOSIS — N200 Calculus of kidney: Secondary | ICD-10-CM | POA: Diagnosis not present

## 2019-04-12 DIAGNOSIS — R8279 Other abnormal findings on microbiological examination of urine: Secondary | ICD-10-CM | POA: Diagnosis not present

## 2019-04-12 DIAGNOSIS — N200 Calculus of kidney: Secondary | ICD-10-CM | POA: Diagnosis not present

## 2019-04-15 DIAGNOSIS — N390 Urinary tract infection, site not specified: Secondary | ICD-10-CM | POA: Diagnosis not present

## 2019-04-15 DIAGNOSIS — B954 Other streptococcus as the cause of diseases classified elsewhere: Secondary | ICD-10-CM | POA: Diagnosis not present

## 2019-04-18 MED FILL — levoFLOXacin 500 MG TABS: 500 | 53 days supply | Qty: 30 | Fill #0

## 2019-04-25 ENCOUNTER — Other Ambulatory Visit (HOSPITAL_COMMUNITY): Payer: Self-pay | Admitting: Urology

## 2019-04-25 ENCOUNTER — Other Ambulatory Visit: Payer: Self-pay | Admitting: Urology

## 2019-04-25 DIAGNOSIS — N200 Calculus of kidney: Secondary | ICD-10-CM

## 2019-05-11 NOTE — Progress Notes (Signed)
PCP -  Cardiologist -   Chest x-ray -  EKG -  Stress Test -  ECHO -  Cardiac Cath -   Sleep Study -  CPAP - osa  Does not wear that often per pt.  Fasting Blood Sugar -  Checks Blood Sugar _____ times a day  Blood Thinner Instructions: Aspirin Instructions: Last Dose:  Anesthesia review:   Patient denies shortness of breath, fever, cough and chest pain at PAT appointment   DENIES   Patient verbalized understanding of instructions that were given to them at the PAT appointment. Patient was also instructed that they will need to review over the PAT instructions again at home before surgery.

## 2019-05-11 NOTE — Patient Instructions (Addendum)
DUE TO COVID-19 ONLY ONE VISITOR IS ALLOWED TO COME WITH YOU AND STAY IN THE WAITING ROOM ONLY DURING PRE OP AND PROCEDURE DAY OF SURGERY. THE 1 VISITOR MAY VISIT WITH YOU AFTER SURGERY IN YOUR PRIVATE ROOM DURING VISITING HOURS ONLY!  YOU NEED TO HAVE A COVID 19 TEST ON__1-15_____ @__1015  am_____, THIS TEST MUST BE DONE BEFORE SURGERY, COME  [address], [place] , [address].  ([place]) ONCE YOUR COVID TEST IS COMPLETED, PLEASE BEGIN THE QUARANTINE INSTRUCTIONS AS OUTLINED IN YOUR HANDOUT.                Zachary Jacobs  05/11/2019   Your procedure is scheduled on: 05-17-19   Report to [facility] Main  Entrance   Report to radiology  At       0800  AM     Call this number if you have problems the morning of surgery [phone]    Remember: Do not eat food or drink liquids :After Midnight.  BRUSH YOUR TEETH MORNING OF SURGERY AND RINSE YOUR MOUTH OUT, NO CHEWING GUM CANDY OR MINTS.     Take these medicines the morning of surgery with A SIP OF WATER: inhaler bring with you                                 You may not have any metal on your body including hair pins and              piercings  Do not wear jewelry, , lotions, powders or perfumes, deodorant                         Men may shave face and neck.   Do not bring valuables to the hospital. [place].  Contacts, dentures or bridgework may not be worn into surgery.      Patients discharged the day of surgery will not be allowed to drive home. IF YOU ARE HAVING SURGERY AND GOING HOME THE SAME DAY, YOU MUST HAVE AN ADULT TO DRIVE YOU HOME AND BE WITH YOU FOR 24 HOURS. YOU MAY GO HOME BY TAXI OR UBER OR ORTHERWISE, BUT AN ADULT MUST ACCOMPANY YOU HOME AND STAY WITH YOU FOR 24 HOURS.  Name and phone number of your driver:  Special Instructions: N/A              Please read over the following fact sheets you were  given: _____________________________________________________________________             [facility] - Preparing for Surgery Before surgery, you can play an important role.  Because skin is not sterile, your skin needs to be as free of germs as possible.  You can reduce the number of germs on your skin by washing with CHG (chlorahexidine gluconate) soap before surgery.  CHG is an antiseptic cleaner which kills germs and bonds with the skin to continue killing germs even after washing. Please DO NOT use if you have an allergy to CHG or antibacterial soaps.  If your skin becomes reddened/irritated stop using the CHG and inform your nurse when you arrive at Short Stay. Do not shave (including legs and underarms) for at least 48 hours prior to the first CHG shower.  You may  shave your face/neck. Please follow these instructions carefully:  1.  Shower with CHG Soap the night before surgery and the  morning of Surgery.  2.  If you choose to wash your hair, wash your hair first as usual with your  normal  shampoo.  3.  After you shampoo, rinse your hair and body thoroughly to remove the  shampoo.                           4.  Use CHG as you would any other liquid soap.  You can apply chg directly  to the skin and wash                       Gently with a scrungie or clean washcloth.  5.  Apply the CHG Soap to your body ONLY FROM THE NECK DOWN.   Do not use on face/ open                           Wound or open sores. Avoid contact with eyes, ears mouth and genitals (private parts).                       Wash face,  Genitals (private parts) with your normal soap.             6.  Wash thoroughly, paying special attention to the area where your surgery  will be performed.  7.  Thoroughly rinse your body with warm water from the neck down.  8.  DO NOT shower/wash with your normal soap after using and rinsing off  the CHG Soap.                9.  Pat yourself dry with a clean towel.            10.  Wear  clean pajamas.            11.  Place clean sheets on your bed the night of your first shower and do not  sleep with pets. Day of Surgery : Do not apply any lotions/deodorants the morning of surgery.  Please wear clean clothes to the hospital/surgery center.  FAILURE TO FOLLOW THESE INSTRUCTIONS MAY RESULT IN THE CANCELLATION OF YOUR SURGERY PATIENT SIGNATURE_________________________________  NURSE SIGNATURE__________________________________  ________________________________________________________________________

## 2019-05-12 MED FILL — TAMSULOSIN HCL 0.4 MG CAP: 0.4 | 30 days supply | Qty: 60 | Fill #4

## 2019-05-13 ENCOUNTER — Other Ambulatory Visit: Payer: Self-pay

## 2019-05-13 ENCOUNTER — Encounter (HOSPITAL_COMMUNITY): Payer: Self-pay

## 2019-05-13 ENCOUNTER — Encounter (HOSPITAL_COMMUNITY)
Admission: RE | Admit: 2019-05-13 | Discharge: 2019-05-13 | Disposition: A | Discharge status: Home / Self Care | Payer: 59 | Source: Ambulatory Visit | Attending: Urology | Admitting: Urology

## 2019-05-13 ENCOUNTER — Other Ambulatory Visit (HOSPITAL_COMMUNITY)
Admission: RE | Admit: 2019-05-13 | Discharge: 2019-05-13 | Disposition: A | Discharge status: Home / Self Care | Payer: 59 | Source: Ambulatory Visit | Attending: Urology | Admitting: Urology

## 2019-05-13 DIAGNOSIS — N200 Calculus of kidney: Secondary | ICD-10-CM | POA: Diagnosis not present

## 2019-05-13 DIAGNOSIS — Z01812 Encounter for preprocedural laboratory examination: Secondary | ICD-10-CM | POA: Diagnosis not present

## 2019-05-13 DIAGNOSIS — Z20822 Contact with and (suspected) exposure to covid-19: Secondary | ICD-10-CM | POA: Diagnosis not present

## 2019-05-13 HISTORY — DX: Prediabetes: R73.03

## 2019-05-13 LAB — CBC
HCT: 46.5 % (ref 39.0–52.0)
Hemoglobin: 15.1 g/dL (ref 13.0–17.0)
MCH: 31.1 pg (ref 26.0–34.0)
MCHC: 32.5 g/dL (ref 30.0–36.0)
MCV: 95.7 fL (ref 80.0–100.0)
Platelets: 297 10*3/uL (ref 150–400)
RBC: 4.86 MIL/uL (ref 4.22–5.81)
RDW: 12.3 % (ref 11.5–15.5)
WBC: 9.8 10*3/uL (ref 4.0–10.5)
nRBC: 0 % (ref 0.0–0.2)

## 2019-05-13 LAB — BASIC METABOLIC PANEL
Anion gap: 8 (ref 5–15)
BUN: 17 mg/dL (ref 8–23)
CO2: 26 mmol/L (ref 22–32)
Calcium: 8.3 mg/dL — ABNORMAL LOW (ref 8.9–10.3)
Chloride: 104 mmol/L (ref 98–111)
Creatinine, Ser: 0.98 mg/dL (ref 0.61–1.24)
GFR calc Af Amer: >60 mL/min (ref >60)
GFR calc non Af Amer: >60 mL/min (ref >60)
Glucose, Bld: 114 mg/dL — ABNORMAL HIGH (ref 70–99)
Potassium: 4.5 mmol/L (ref 3.5–5.1)
Sodium: 138 mmol/L (ref 135–145)

## 2019-05-13 LAB — HEMOGLOBIN A1C
Hgb A1c MFr Bld: 6.1 % — ABNORMAL HIGH (ref 4.8–5.6)
Mean Plasma Glucose: 128.37 mg/dL

## 2019-05-14 LAB — NOVEL CORONAVIRUS, NAA (HOSP ORDER, SEND-OUT TO REF LAB; TAT 18-24 HRS): SARS-CoV-2, NAA: NOT DETECTED

## 2019-05-15 ENCOUNTER — Other Ambulatory Visit: Payer: Self-pay | Admitting: Radiology

## 2019-05-15 NOTE — H&P (Signed)
HPI: Zachary Jacobs is a 64 year-old male with a Lt. staghorn renal calculus.  The patient was last seen 05/27/2017. The patient's stone was on his left side. He did not pass a stone since the last office visit.   The patient has not had any flank pain since they were last seen. The patient denies any progressive voiding symptoms. He has no seen blood in his urine since the last visit. He is not currently having flank pain, back pain, groin pain, nausea, vomiting, fever or chills. He has had Ureteroscopy and Percutaneous Nephrolithotomy for treatment of his stones in the past. This condition would be considered of mild to moderate severity with no modifying factors or associated signs or symptoms other than as noted above.   10/28/18: He has returned today for follow-up of history of calculus disease and has not had any flank pain or hematuria. He said his urine was cloudy this past weekend he said he has force fluids and it has cleared. He denies any dysuria   04/12/19: He returns today for follow-up of a history of calculus disease and also has had some difficulty with infections. He said his urine became cloudy and he was placed on Keflex and this resulted in resolution of his cloudy urine however the cloudiness recurred after he stop taking medication. He denies any dysuria or hematuria. He is not having any flank pain.     CC/HPI: 12/30/16: Seen several times over the past 2 months by primary care for increased voiding symptoms associated with passage of several kidney stones with possible UTI. He's been treated twice with Keflex as well as ciprofloxacin completed last week. Most recent urine culture positive for lactobacillus. He most recently passed 2 stone fragments yesterday. No longer having bothersome increase in frequency or urgency. Stream is adequate. Not having any dysuria or gross hematuria. He remains taking tamsulosin once daily previously was taking it twice a day. Denies any back or  flank pain. Constitutionally feels well. Patient has not had any recent imaging studies since last office visit which indicated the possibility of small stones in the lower pole of left kidney. Recent lab work by primary care indicated creatinine less than 1. PSA 0.9. Stone composition 70% calcium oxalate and 25% calcium phosphate.   01/27/17: He follows up today for x-ray and renal ultrasound. Last office visit was suspicious for obstructing calculi being a nidus for recurrent UTI symptoms. He had recently passed kidney stones as well. Over the past week, he is also noted weakening of urine stream as well as increased hesitancy and feeling of incomplete emptying. He remains on tamsulosin. At baseline patient's PVR typically is around 300 mL. He's not having any unilateral flank back pain. Denies gross hematuria or dysuria.   05/27/17: He presents today with a chief complaint of cloudy urine. He said that on 05/18/17 he was experiencing extremely cloudy urine and was seen by his primary care provider. A urine culture was performed but for was found to be negative. He was not experiencing any flank pain or hematuria. He said he had a similar episode in 11/18 and was prescribed antibiotics with clearance of the urine. He said his urine is now beginning to clear. He is not having any dysuria and also not having any flank pain or fever.  His cloudy urine would be considered of mild severity with no modifying factors or associated signs and symptoms other than as above.   Culture results:  03/13/17 - no growth  05/18/17 - no growth  08/08/17 - lactobacillus (contamination)  10/28/18 - no growth     ALLERGIES: keflex    MEDICATIONS: Doxycycline Hyclate 100 mg capsule 1 capsule PO Q 12 H  Tamsulosin Hcl 0.4 mg capsule 2 capsule PO Q HS  Albuterol 90 mcg aerosol Inhalation  Symbicort 80 mcg-4.5 mcg/actuation hfa aerosol with adapter Inhalation     GU PSH: Percut Stone Removal >2cm - 08/14/14 Ureteroscopic  laser litho - 08-14-2014       PSH Notes: Cystoscopy Ureteroscopy Lithotripsy Incl Insert Indwelling Ureter Stent, Percutaneous Lithotomy For Stone Over 2cm., No Surgical Problems   NON-GU PSH: None   GU PMH: Encounter for Prostate Cancer screening (Stable), His prostate was noted to be smooth and benign. I will obtain a screening PSA today. - 10/28/2018, Prostate cancer screening, - August 14, 2015 Renal calculus (Stable), Left, He has a history of calculus disease and there does appear to possibly be a faint density overlying the left kidney and a branched configuration. It seems odd. I will culture the urine to make sure he is not got a Proteus infection and then if not will see him back in 6 months and I will obtain a CT scan to better evaluate the kidney. - 10/28/2018, Staghorn renal calculus, - 08/14/14 Acute Cystitis/UTI - 08-13-16 History of urolithiasis, He may have 2 stones in the lower pole of the left kidney. They are unchanged from previous densities seen on KUB in the past. 08/14/15 Incomplete bladder emptying, Although he does have a fairly significant elevation in his PVR he has virtually no significant voiding symptoms whatsoever. 2015/08/14 BPH w/LUTS, BPH (benign prostatic hypertrophy) with urinary retention - 14-Aug-2015 Obstructive and reflux uropathy, Unspec, Obstructive uropathy - 08/14/15 Renal cyst, Renal cyst, acquired, right - 2015-08-14 Urinary Tract Inf, Unspec site, Pyuria - 14-Aug-2015 Gross hematuria, Gross hematuria - 14-Aug-2014 Hydronephrosis Unspec, Hydronephrosis, left - 08/13/12 Ureteral calculus, Calculus of left ureter - 2012-08-13      PMH Notes: Calculus disease: He had a right ureteral stone in 10/01. Dr. Amalia Hailey performed right ureteroscopy and found no stone at the time. A CT scan done in 11/12 revealed no renal calculi present.  Left PCNL 11/16 - partial staghorn calculus completely cleared after second look ureteroscopy in 12/16.  Stone analysis: Calcium oxalate monohydrate 50% and calcium oxalate dihydrate 50%.  Repeat  stone analysis 11/16 - 70% calcium oxalate, 25% calcium phosphate and 5% uric acid.   Right renal cyst: A simple cyst was noted in the right kidney on CT scan in 10/01 and was noted to be unchanged on his CT scan in 11/16.     NON-GU PMH: Pyuria/other UA findings, He did have pyuria and bacteriuria but no nitrites. His urine will be cultured today. - 10/28/2018, He has significant pyuria and bacteriuria today with leukocyte esterase but no nitrite. I am going to culture his urine and treat according to sensitivities. We did discuss the fact that his incomplete bladder emptying does place him at increased risk for development of UTIs., 08/13/17 Encounter for general adult medical examination without abnormal findings, Encounter for preventive health examination - 08/14/2015 Personal history of other diseases of the digestive system, History of esophageal reflux - 2012/08/13 Personal history of other diseases of the nervous system and sense organs, History of sleep apnea - 13-Aug-2012 Personal history of other endocrine, nutritional and metabolic disease, History of hypothyroidism - 2012/08/13    FAMILY HISTORY: Death In The Family Father - Runs In [facility]  Family Health Status - Mother's Age - Runs In Family Family Health Status Children _4__ Living Daughter - Runs In Family No pertinent family history - Other   SOCIAL HISTORY: None    Notes: Former smoker, Occupation:, Caffeine Use, Alcohol Use, Marital History - Currently Married   REVIEW OF SYSTEMS:    GU Review Male:   Patient denies frequent urination, hard to postpone urination, burning/ pain with urination, get up at night to urinate, leakage of urine, stream starts and stops, trouble starting your stream, have to strain to urinate , erection problems, and penile pain.  Gastrointestinal (Upper):   Patient denies nausea, vomiting, and indigestion/ heartburn.  Gastrointestinal (Lower):   Patient denies diarrhea and constipation.  Constitutional:   Patient denies fever,  night sweats, weight loss, and fatigue.  Skin:   Patient denies skin rash/ lesion and itching.  Eyes:   Patient denies blurred vision and double vision.  Ears/ Nose/ Throat:   Patient denies sore throat and sinus problems.  Hematologic/Lymphatic:   Patient denies easy bruising and swollen glands.  Cardiovascular:   Patient denies leg swelling and chest pains.  Respiratory:   Patient denies cough and shortness of breath.  Endocrine:   Patient denies excessive thirst.  Musculoskeletal:   Patient denies back pain and joint pain.  Neurological:   Patient denies headaches and dizziness.  Psychologic:   Patient denies depression and anxiety.   VITAL SIGNS:    Weight 290 lb / 131.54 kg  Height 70 in / 177.8 cm  BP 115/69 mmHg  Pulse 51 /min  BMI 41.6 kg/m   Physical Exam  Constitutional: Well nourished and well developed . No acute distress.   ENT:. The ears and nose are normal in appearance.   Neck: The appearance of the neck is normal and no neck mass is present.   Pulmonary: No respiratory distress and normal respiratory rhythm and effort.   Cardiovascular: Heart rate and rhythm are normal . No peripheral edema.   Abdomen: The abdomen is obese. The abdomen is soft and nontender. No masses are palpated. No CVA tenderness. No hernias are palpable. No hepatosplenomegaly noted.   Lymphatics: The femoral and inguinal nodes are not enlarged or tender.   Skin: Normal skin turgor, no visible rash and no visible skin lesions.   Neuro/Psych:. Mood and affect are appropriate.    PAST DATA REVIEWED:  Source Of History:  Patient  Lab Test Review:   Calcium  Records Review:   Previous Patient Records, POC Tool  Urine Test Review:   Urine Culture  X-Ray Review: C.T. Abdomen/Pelvis: Reviewed Films. Reviewed Report. Discussed With Patient. EXAM: CT ABDOMEN AND PELVIS WITHOUT CONTRAST TECHNIQUE: Multidetector CT imaging of the abdomen and pelvis was performed following the standard protocol  without IV contrast. COMPARISON: 03/20/2015, from [facility]. FINDINGS: Lower chest: Mild nonspecific interstitial thickening at the lung bases, similar. Normal heart size without pericardial or pleural effusion. Hepatobiliary: Mild hepatic steatosis. Normal gallbladder, without biliary ductal dilatation. Pancreas: Normal, without mass or ductal dilatation. Spleen: Normal in size, without focal abnormality. Adrenals/Urinary Tract: Normal adrenal glands. Mild renal cortical thinning bilaterally. Low-density right renal lesions of up to 2.6 cm are likely cysts. Mild upper pole left-sided caliectasis is unchanged. Scarred lower pole left kidney with multiple stones within. Conglomerate of stones likely represents a developing staghorn type, including on coronal image 114 at maximally 2.6 cm. The overall stone burden is increased compared to 2016. The ureteric stent has been removed. No  hydroureter or ureteric calculi. No bladder calculi. Stomach/Bowel: Normal stomach, without wall thickening. Moderate amount of stool within the rectum. Normal terminal ileum. Normal small bowel. Vascular/Lymphatic: Aortic atherosclerosis. Prominent porta hepatis nodes are unchanged and likely related to steatosis. Small nodes within the small bowel mesentery with increased density in the mesenteric fat. Similar in appearance compared to 2016. No pelvic sidewall adenopathy. Reproductive: Normal prostate. Other: Fat containing ventral pelvic wall hernia is small, including on 55/2. Musculoskeletal: Lumbar spondylosis. IMPRESSION: 1. Lower pole left-sided stones, likely a developing staghorn type calculus as detailed above. Chronic mild upper pole left-sided caliectasis, without overt hydronephrosis or hydroureter. 2. No distal stones. 3. Hepatic steatosis. 4. Small bowel mesenteric findings which likely represent mesenteric adenitis/panniculitis. Of doubtful clinical significance, given chronicity to 2016.    10/28/18 12/19/16  08/21/15  PSA  Total PSA 0.52 ng/mL 0.90 ng/dl 0.39    Notes:                     His left renal calculus has Hounsfield units of about 800 - 1000.  His urine culture in 7/20 was negative.  A creatinine in 8/18 was 0.99 and his serum calcium at that time was 8.8.   PROCEDURES:          Urinalysis w/Scope Dipstick Dipstick Cont'd Micro  Color: Yellow Bilirubin: Neg mg/dL WBC/hpf: >60/hpf  Appearance: Cloudy Ketones: Neg mg/dL RBC/hpf: 0 - 2/hpf  Specific Gravity: 1.020 Blood: 1+ ery/uL Bacteria: Few (10-25/hpf)  pH: <=5.0 Protein: 1+ mg/dL Cystals: NS (Not Seen)  Glucose: Neg mg/dL Urobilinogen: 0.2 mg/dL Casts: NS (Not Seen)    Nitrites: Neg Trichomonas: Not Present    Leukocyte Esterase: 3+ leu/uL Mucous: Not Present      Epithelial Cells: NS (Not Seen)      Yeast: NS (Not Seen)      Sperm: Not Present    Notes: unspun micro performed    ASSESSMENT/PLAN:      ICD-10 Details  1 GU:   Renal calculus - N20.0 Left, Worsening  2 NON-GU:   Pyuria/other UA findings - R82.79               Notes:   He has redeveloped a branched partial staghorn calculus in the lower pole of his left kidney. I told him that in the past his cultures have shown positivity for a ureace producing organism although his previous stone analyses were not consistent with triple phosphate stones.  We discussed fact that I need to readdress the stone in his left kidney. I told him it was possible to culture his urine in place him on some form of low-dose medication prevent infections if the stone is colonized but I am afraid that this is not old good long-term solution and have discussed proceeding with repeat PCNL with likely 2nd look ureteroscopy to clear him completely. In the meantime I am going to culture his urine and will treat according to sensitivities and then I am going to place him on a low-dose antibiotic until the time of his PCNL.

## 2019-05-15 NOTE — Discharge Instructions (Signed)

## 2019-05-16 ENCOUNTER — Other Ambulatory Visit: Payer: Self-pay | Admitting: Radiology

## 2019-05-17 ENCOUNTER — Ambulatory Visit (HOSPITAL_COMMUNITY)
Admission: RE | Admit: 2019-05-17 | Discharge: 2019-05-17 | Disposition: A | Discharge status: Home / Self Care | Payer: 59 | Source: Ambulatory Visit | Attending: Urology | Admitting: Urology

## 2019-05-17 ENCOUNTER — Other Ambulatory Visit: Payer: Self-pay

## 2019-05-17 ENCOUNTER — Observation Stay (HOSPITAL_COMMUNITY)
Admission: RE | Admit: 2019-05-17 | Discharge: 2019-05-18 | Disposition: A | Discharge status: Home / Self Care | Payer: 59 | Source: Other Acute Inpatient Hospital | Attending: Urology | Admitting: Urology

## 2019-05-17 ENCOUNTER — Encounter (HOSPITAL_COMMUNITY)
Admission: RE | Disposition: A | Discharge status: Home / Self Care | Payer: Self-pay | Source: Other Acute Inpatient Hospital | Attending: Urology

## 2019-05-17 ENCOUNTER — Observation Stay (HOSPITAL_COMMUNITY): Payer: 59

## 2019-05-17 ENCOUNTER — Ambulatory Visit (HOSPITAL_COMMUNITY): Payer: 59 | Admitting: Physician Assistant

## 2019-05-17 ENCOUNTER — Ambulatory Visit (HOSPITAL_COMMUNITY): Payer: 59

## 2019-05-17 ENCOUNTER — Encounter (HOSPITAL_COMMUNITY): Payer: Self-pay | Admitting: Urology

## 2019-05-17 DIAGNOSIS — Z7951 Long term (current) use of inhaled steroids: Secondary | ICD-10-CM | POA: Insufficient documentation

## 2019-05-17 DIAGNOSIS — Z87442 Personal history of urinary calculi: Secondary | ICD-10-CM | POA: Diagnosis not present

## 2019-05-17 DIAGNOSIS — N200 Calculus of kidney: Secondary | ICD-10-CM | POA: Diagnosis not present

## 2019-05-17 DIAGNOSIS — J449 Chronic obstructive pulmonary disease, unspecified: Secondary | ICD-10-CM | POA: Insufficient documentation

## 2019-05-17 DIAGNOSIS — G4730 Sleep apnea, unspecified: Secondary | ICD-10-CM | POA: Insufficient documentation

## 2019-05-17 DIAGNOSIS — Z87891 Personal history of nicotine dependence: Secondary | ICD-10-CM | POA: Diagnosis not present

## 2019-05-17 DIAGNOSIS — Z6841 Body Mass Index (BMI) 40.0 and over, adult: Secondary | ICD-10-CM | POA: Insufficient documentation

## 2019-05-17 DIAGNOSIS — Z881 Allergy status to other antibiotic agents status: Secondary | ICD-10-CM | POA: Insufficient documentation

## 2019-05-17 DIAGNOSIS — Z79899 Other long term (current) drug therapy: Secondary | ICD-10-CM | POA: Diagnosis not present

## 2019-05-17 DIAGNOSIS — D649 Anemia, unspecified: Secondary | ICD-10-CM | POA: Diagnosis not present

## 2019-05-17 HISTORY — PX: IR URETERAL STENT LEFT NEW ACCESS W/O SEP NEPHROSTOMY CATH: IMG6075

## 2019-05-17 HISTORY — PX: NEPHROLITHOTOMY: SHX5134

## 2019-05-17 LAB — CBC WITH DIFFERENTIAL/PLATELET
Abs Immature Granulocytes: 0.04 10*3/uL (ref 0.00–0.07)
Basophils Absolute: 0.1 10*3/uL (ref 0.0–0.1)
Basophils Relative: 1 %
Eosinophils Absolute: 0.7 10*3/uL — ABNORMAL HIGH (ref 0.0–0.5)
Eosinophils Relative: 6 %
HCT: 47.2 % (ref 39.0–52.0)
Hemoglobin: 15.6 g/dL (ref 13.0–17.0)
Immature Granulocytes: 0 %
Lymphocytes Relative: 20 %
Lymphs Abs: 2.4 10*3/uL (ref 0.7–4.0)
MCH: 31.5 pg (ref 26.0–34.0)
MCHC: 33.1 g/dL (ref 30.0–36.0)
MCV: 95.2 fL (ref 80.0–100.0)
Monocytes Absolute: 1.3 10*3/uL — ABNORMAL HIGH (ref 0.1–1.0)
Monocytes Relative: 11 %
Neutro Abs: 7.3 10*3/uL (ref 1.7–7.7)
Neutrophils Relative %: 62 %
Platelets: 268 10*3/uL (ref 150–400)
RBC: 4.96 MIL/uL (ref 4.22–5.81)
RDW: 12.4 % (ref 11.5–15.5)
WBC: 11.9 10*3/uL — ABNORMAL HIGH (ref 4.0–10.5)
nRBC: 0 % (ref 0.0–0.2)

## 2019-05-17 LAB — TYPE AND SCREEN
ABO/RH(D): A POS
Antibody Screen: NEGATIVE

## 2019-05-17 LAB — PROTIME-INR
INR: 1 (ref 0.8–1.2)
Prothrombin Time: 12.7 seconds (ref 11.4–15.2)

## 2019-05-17 LAB — ABO/RH: ABO/RH(D): A POS

## 2019-05-17 SURGERY — NEPHROLITHOTOMY PERCUTANEOUS
Anesthesia: General | Laterality: Left

## 2019-05-17 MED ORDER — ACETAMINOPHEN 10 MG/ML IV SOLN
INTRAVENOUS | Status: DC | PRN
  Administered 2019-05-17: 1000 mg via INTRAVENOUS

## 2019-05-17 MED ORDER — FENTANYL CITRATE (PF) 100 MCG/2ML IJ SOLN
INTRAMUSCULAR | Status: DC | PRN
  Administered 2019-05-17: 25 ug via INTRAVENOUS
  Administered 2019-05-17 (×3): 50 ug via INTRAVENOUS

## 2019-05-17 MED ORDER — MIDAZOLAM HCL 2 MG/2ML IJ SOLN
INTRAMUSCULAR | Status: AC
  Filled 2019-05-17: qty 2

## 2019-05-17 MED ORDER — IOHEXOL 300 MG/ML  SOLN
50.0000 mL | Freq: Once | INTRAMUSCULAR | Status: AC | PRN
  Administered 2019-05-17: 11:00:00 45 mL

## 2019-05-17 MED ORDER — MIDAZOLAM HCL 2 MG/2ML IJ SOLN
INTRAMUSCULAR | Status: AC | PRN
  Administered 2019-05-17: 1 mg via INTRAVENOUS
  Administered 2019-05-17: 0.5 mg via INTRAVENOUS
  Administered 2019-05-17: 1 mg via INTRAVENOUS
  Administered 2019-05-17: 0.5 mg via INTRAVENOUS
  Administered 2019-05-17 (×2): 1 mg via INTRAVENOUS

## 2019-05-17 MED ORDER — SUGAMMADEX SODIUM 200 MG/2ML IV SOLN
INTRAVENOUS | Status: DC | PRN
  Administered 2019-05-17: 250 mg via INTRAVENOUS

## 2019-05-17 MED ORDER — IOHEXOL 300 MG/ML  SOLN
50.0000 mL | Freq: Once | INTRAMUSCULAR | Status: AC | PRN
  Administered 2019-05-17: 11:00:00 5 mL

## 2019-05-17 MED ORDER — ACETAMINOPHEN 10 MG/ML IV SOLN
INTRAVENOUS | Status: AC
  Filled 2019-05-17: qty 100

## 2019-05-17 MED ORDER — ONDANSETRON HCL 4 MG/2ML IJ SOLN: 4.0000 mg | INTRAMUSCULAR | Status: DC | PRN

## 2019-05-17 MED ORDER — BUPIVACAINE HCL (PF) 0.5 % IJ SOLN
INTRAMUSCULAR | Status: AC
  Filled 2019-05-17: qty 30

## 2019-05-17 MED ORDER — ACETAMINOPHEN 325 MG PO TABS: 650.0000 mg | ORAL_TABLET | ORAL | Status: DC | PRN

## 2019-05-17 MED ORDER — BUPIVACAINE HCL (PF) 0.5 % IJ SOLN
INTRAMUSCULAR | Status: DC | PRN
  Administered 2019-05-17: 20 mL

## 2019-05-17 MED ORDER — LACTATED RINGERS IV SOLN: INTRAVENOUS | Status: DC

## 2019-05-17 MED ORDER — KETAMINE HCL 10 MG/ML IJ SOLN
INTRAMUSCULAR | Status: DC | PRN
  Administered 2019-05-17: 30 mg via INTRAVENOUS

## 2019-05-17 MED ORDER — DEXAMETHASONE SODIUM PHOSPHATE 10 MG/ML IJ SOLN
INTRAMUSCULAR | Status: DC | PRN
  Administered 2019-05-17: 10 mg via INTRAVENOUS

## 2019-05-17 MED ORDER — PROPOFOL 10 MG/ML IV BOLUS
INTRAVENOUS | Status: AC
  Filled 2019-05-17: qty 40

## 2019-05-17 MED ORDER — OXYCODONE-ACETAMINOPHEN 5-325 MG PO TABS
1.0000 | ORAL_TABLET | ORAL | Status: DC | PRN
  Administered 2019-05-17: 1 via ORAL
  Administered 2019-05-18: 2 via ORAL
  Filled 2019-05-17: qty 2
  Filled 2019-05-17: qty 1

## 2019-05-17 MED ORDER — PROPOFOL 10 MG/ML IV BOLUS
INTRAVENOUS | Status: AC
  Filled 2019-05-17: qty 20

## 2019-05-17 MED ORDER — OXYBUTYNIN CHLORIDE 5 MG PO TABS: 5.0000 mg | ORAL_TABLET | Freq: Three times a day (TID) | ORAL | Status: DC | PRN

## 2019-05-17 MED ORDER — IOHEXOL 300 MG/ML  SOLN
INTRAMUSCULAR | Status: DC | PRN
  Administered 2019-05-17: 10 mL

## 2019-05-17 MED ORDER — ONDANSETRON HCL 4 MG/2ML IJ SOLN: 4.0000 mg | Freq: Once | INTRAMUSCULAR | Status: DC | PRN

## 2019-05-17 MED ORDER — HYDROMORPHONE HCL 1 MG/ML IJ SOLN
0.5000 mg | INTRAMUSCULAR | Status: DC | PRN
  Administered 2019-05-17: 1 mg via INTRAVENOUS
  Filled 2019-05-17: qty 1

## 2019-05-17 MED ORDER — FENTANYL CITRATE (PF) 100 MCG/2ML IJ SOLN
INTRAMUSCULAR | Status: AC
  Filled 2019-05-17: qty 2

## 2019-05-17 MED ORDER — SODIUM CHLORIDE 0.9 % IV SOLN: INTRAVENOUS | Status: DC

## 2019-05-17 MED ORDER — LIDOCAINE HCL (PF) 1 % IJ SOLN
INTRAMUSCULAR | Status: AC | PRN
  Administered 2019-05-17: 10 mL

## 2019-05-17 MED ORDER — CIPROFLOXACIN IN D5W 400 MG/200ML IV SOLN
INTRAVENOUS | Status: AC
  Filled 2019-05-17: qty 200

## 2019-05-17 MED ORDER — ROCURONIUM BROMIDE 10 MG/ML (PF) SYRINGE
PREFILLED_SYRINGE | INTRAVENOUS | Status: AC
  Filled 2019-05-17: qty 10

## 2019-05-17 MED ORDER — LIDOCAINE HCL (CARDIAC) PF 100 MG/5ML IV SOSY
PREFILLED_SYRINGE | INTRAVENOUS | Status: DC | PRN
  Administered 2019-05-17: 40 mg via INTRAVENOUS

## 2019-05-17 MED ORDER — LIDOCAINE 2% (20 MG/ML) 5 ML SYRINGE
INTRAMUSCULAR | Status: AC
  Filled 2019-05-17: qty 5

## 2019-05-17 MED ORDER — FENTANYL CITRATE (PF) 100 MCG/2ML IJ SOLN
25.0000 ug | INTRAMUSCULAR | Status: DC | PRN
  Administered 2019-05-17: 14:00:00 50 ug via INTRAVENOUS

## 2019-05-17 MED ORDER — CHLORHEXIDINE GLUCONATE CLOTH 2 % EX PADS: 6.0000 | MEDICATED_PAD | Freq: Every day | CUTANEOUS | Status: DC

## 2019-05-17 MED ORDER — CIPROFLOXACIN IN D5W 400 MG/200ML IV SOLN
400.0000 mg | Freq: Two times a day (BID) | INTRAVENOUS | Status: AC
  Administered 2019-05-17: 20:00:00 400 mg via INTRAVENOUS
  Filled 2019-05-17: qty 200

## 2019-05-17 MED ORDER — ONDANSETRON HCL 4 MG/2ML IJ SOLN
INTRAMUSCULAR | Status: DC | PRN
  Administered 2019-05-17: 4 mg via INTRAVENOUS

## 2019-05-17 MED ORDER — 0.9 % SODIUM CHLORIDE (POUR BTL) OPTIME
TOPICAL | Status: DC | PRN
  Administered 2019-05-17: 1000 mL

## 2019-05-17 MED ORDER — ACETAMINOPHEN 500 MG PO TABS
1000.0000 mg | ORAL_TABLET | Freq: Four times a day (QID) | ORAL | Status: DC
  Administered 2019-05-17: 17:00:00 1000 mg via ORAL
  Filled 2019-05-17: qty 2

## 2019-05-17 MED ORDER — ROCURONIUM BROMIDE 100 MG/10ML IV SOLN
INTRAVENOUS | Status: DC | PRN
  Administered 2019-05-17 (×2): 20 mg via INTRAVENOUS
  Administered 2019-05-17: 60 mg via INTRAVENOUS
  Administered 2019-05-17: 20 mg via INTRAVENOUS

## 2019-05-17 MED ORDER — FENTANYL CITRATE (PF) 100 MCG/2ML IJ SOLN
INTRAMUSCULAR | Status: AC | PRN
  Administered 2019-05-17 (×3): 50 ug via INTRAVENOUS

## 2019-05-17 MED ORDER — BACITRACIN-NEOMYCIN-POLYMYXIN 400-5-5000 EX OINT: 1.0000 "application " | TOPICAL_OINTMENT | Freq: Three times a day (TID) | CUTANEOUS | Status: DC | PRN

## 2019-05-17 MED ORDER — PROPOFOL 10 MG/ML IV BOLUS
INTRAVENOUS | Status: DC | PRN
  Administered 2019-05-17: 200 mg via INTRAVENOUS

## 2019-05-17 MED ORDER — SODIUM CHLORIDE 0.9 % IR SOLN
Status: DC | PRN
  Administered 2019-05-17: 24000 mL

## 2019-05-17 MED ORDER — LIDOCAINE HCL 1 % IJ SOLN
INTRAMUSCULAR | Status: AC
  Filled 2019-05-17: qty 20

## 2019-05-17 MED ORDER — ONDANSETRON HCL 4 MG/2ML IJ SOLN
INTRAMUSCULAR | Status: AC
  Filled 2019-05-17: qty 2

## 2019-05-17 MED ORDER — KETAMINE HCL 10 MG/ML IJ SOLN
INTRAMUSCULAR | Status: AC
  Filled 2019-05-17: qty 1

## 2019-05-17 MED ORDER — ZOLPIDEM TARTRATE 5 MG PO TABS: 5.0000 mg | ORAL_TABLET | Freq: Every evening | ORAL | Status: DC | PRN

## 2019-05-17 MED ORDER — CIPROFLOXACIN IN D5W 400 MG/200ML IV SOLN: 400.0000 mg | INTRAVENOUS | Status: DC

## 2019-05-17 MED ORDER — CIPROFLOXACIN IN D5W 400 MG/200ML IV SOLN
400.0000 mg | Freq: Once | INTRAVENOUS | Status: AC
  Administered 2019-05-17: 10:00:00 400 mg via INTRAVENOUS

## 2019-05-17 MED ORDER — DEXAMETHASONE SODIUM PHOSPHATE 10 MG/ML IJ SOLN
INTRAMUSCULAR | Status: AC
  Filled 2019-05-17: qty 1

## 2019-05-17 MED ORDER — FENTANYL CITRATE (PF) 250 MCG/5ML IJ SOLN
INTRAMUSCULAR | Status: AC
  Filled 2019-05-17: qty 5

## 2019-05-17 MED ORDER — MIDAZOLAM HCL 2 MG/2ML IJ SOLN
INTRAMUSCULAR | Status: AC
  Filled 2019-05-17: qty 4

## 2019-05-17 SURGICAL SUPPLY — 59 items
AGENT HMST KT MTR STRL THRMB (HEMOSTASIS)
APL ESCP 34 STRL LF DISP (HEMOSTASIS)
APL PRP STRL LF DISP 70% ISPRP (MISCELLANEOUS) ×1
APL SKNCLS STERI-STRIP NONHPOA (GAUZE/BANDAGES/DRESSINGS) ×1
APPLICATOR SURGIFLO ENDO (HEMOSTASIS) IMPLANT
BAG DRN RND TRDRP ANRFLXCHMBR (UROLOGICAL SUPPLIES) ×1
BAG URINE DRAIN 2000ML AR STRL (UROLOGICAL SUPPLIES) ×1 IMPLANT
BASKET ZERO TIP NITINOL 2.4FR (BASKET) ×1 IMPLANT
BENZOIN TINCTURE PRP APPL 2/3 (GAUZE/BANDAGES/DRESSINGS) ×2 IMPLANT
BLADE SURG 15 STRL LF DISP TIS (BLADE) ×1 IMPLANT
BLADE SURG 15 STRL SS (BLADE) ×2
BSKT STON RTRVL ZERO TP 2.4FR (BASKET) ×1
CATH FOLEY 2W COUNCIL 20FR 5CC (CATHETERS) ×1 IMPLANT
CATH FOLEY 2WAY SLVR  5CC 18FR (CATHETERS)
CATH FOLEY 2WAY SLVR 5CC 18FR (CATHETERS) IMPLANT
CATH IMAGER II 65CM (CATHETERS) IMPLANT
CATH X-FORCE N30 NEPHROSTOMY (TUBING) ×1 IMPLANT
CHLORAPREP W/TINT 26 (MISCELLANEOUS) ×2 IMPLANT
COVER SURGICAL LIGHT HANDLE (MISCELLANEOUS) ×2 IMPLANT
COVER WAND RF STERILE (DRAPES) IMPLANT
DRAPE C-ARM 42X120 X-RAY (DRAPES) ×2 IMPLANT
DRAPE LINGEMAN PERC (DRAPES) ×2 IMPLANT
DRAPE SURG IRRIG POUCH 19X23 (DRAPES) ×2 IMPLANT
DRSG PAD ABDOMINAL 8X10 ST (GAUZE/BANDAGES/DRESSINGS) ×3 IMPLANT
DRSG TEGADERM 8X12 (GAUZE/BANDAGES/DRESSINGS) ×1 IMPLANT
FIBER LASER TRAC TIP (UROLOGICAL SUPPLIES) IMPLANT
GAUZE SPONGE 4X4 12PLY STRL (GAUZE/BANDAGES/DRESSINGS) ×2 IMPLANT
GLOVE BIOGEL M 8.0 STRL (GLOVE) ×2 IMPLANT
GOWN STRL REUS W/TWL XL LVL3 (GOWN DISPOSABLE) ×2 IMPLANT
GUIDEWIRE AMPLAZ .035X145 (WIRE) ×4 IMPLANT
GUIDEWIRE SENSOR ANG DUAL FLEX (WIRE) ×1 IMPLANT
GUIDEWIRE STR DUAL SENSOR (WIRE) IMPLANT
HOLDER FOLEY CATH W/STRAP (MISCELLANEOUS) ×2 IMPLANT
KIT BASIN OR (CUSTOM PROCEDURE TRAY) ×2 IMPLANT
KIT PROBE 340X3.4XDISP GRN (MISCELLANEOUS) IMPLANT
KIT PROBE TRILOGY 3.4X340 (MISCELLANEOUS)
KIT PROBE TRILOGY 3.9X350 (MISCELLANEOUS) ×1 IMPLANT
KIT TURNOVER KIT A (KITS) IMPLANT
MANIFOLD NEPTUNE II (INSTRUMENTS) ×2 IMPLANT
NS IRRIG 1000ML POUR BTL (IV SOLUTION) ×2 IMPLANT
PACK CYSTO (CUSTOM PROCEDURE TRAY) ×2 IMPLANT
SHEATH PEELAWAY SET 9 (SHEATH) ×2 IMPLANT
SPONGE LAP 4X18 RFD (DISPOSABLE) ×2 IMPLANT
STENT URET 6FRX26 CONTOUR (STENTS) ×1 IMPLANT
STOPCOCK 4 WAY LG BORE MALE ST (IV SETS) ×2 IMPLANT
SURGIFLO W/THROMBIN 8M KIT (HEMOSTASIS) IMPLANT
SUT MNCRL AB 4-0 PS2 18 (SUTURE) IMPLANT
SUT PROLENE 1 CTX 30  8455H (SUTURE) ×1
SUT PROLENE 1 CTX 30 8455H (SUTURE) IMPLANT
SUT SILK 2 0 30  PSL (SUTURE)
SUT SILK 2 0 30 PSL (SUTURE) IMPLANT
SYR 10ML LL (SYRINGE) ×2 IMPLANT
SYR 20ML LL LF (SYRINGE) ×4 IMPLANT
TOWEL OR 17X26 10 PK STRL BLUE (TOWEL DISPOSABLE) ×2 IMPLANT
TOWEL OR NON WOVEN STRL DISP B (DISPOSABLE) ×2 IMPLANT
TRAY FOLEY MTR SLVR 16FR STAT (SET/KITS/TRAYS/PACK) ×2 IMPLANT
TUBING CONNECTING 10 (TUBING) ×4 IMPLANT
TUBING STONE CATCHER TRILOGY (MISCELLANEOUS) ×1 IMPLANT
TUBING UROLOGY SET (TUBING) ×3 IMPLANT

## 2019-05-17 NOTE — Sedation Documentation (Signed)
Patient is resting comfortably, snoring lightly, arousing easily, in NAD.

## 2019-05-17 NOTE — Procedures (Signed)
Staghorn calculus  S/p difficult fluoro LT PCN ACCESS for PNL later today  No comp Stable ebl min Full report in pacs

## 2019-05-17 NOTE — Anesthesia Preprocedure Evaluation (Signed)
Anesthesia Evaluation  Patient identified by MRN, date of birth, ID band Patient awake    Reviewed: Allergy & Precautions, NPO status , Patient's Chart, lab work & pertinent test results  Airway Mallampati: II  TM Distance: >3 FB     Dental  (+) Dental Advisory Given   Pulmonary sleep apnea , COPD, former smoker,    breath sounds clear to auscultation       Cardiovascular negative cardio ROS   Rhythm:Regular Rate:Normal     Neuro/Psych negative neurological ROS     GI/Hepatic negative GI ROS, Neg liver ROS,   Endo/Other  Morbid obesity  Renal/GU Renal disease     Musculoskeletal   Abdominal   Peds  Hematology  (+) anemia ,   Anesthesia Other Findings   Reproductive/Obstetrics                              Lab Results  Component Value Date   WBC 9.8 05/13/2019   HGB 15.1 05/13/2019   HCT 46.5 05/13/2019   MCV 95.7 05/13/2019   PLT 297 05/13/2019   Lab Results  Component Value Date   CREATININE 0.98 05/13/2019   BUN 17 05/13/2019   NA 138 05/13/2019   K 4.5 05/13/2019   CL 104 05/13/2019   CO2 26 05/13/2019    Anesthesia Physical Anesthesia Plan  ASA: III  Anesthesia Plan: General   Post-op Pain Management:    Induction: Intravenous  PONV Risk Score and Plan: 2 and Dexamethasone, Ondansetron and Treatment may vary due to age or medical condition  Airway Management Planned: Oral ETT  Additional Equipment:   Intra-op Plan:   Post-operative Plan: Extubation in OR  Informed Consent: I have reviewed the patients History and Physical, chart, labs and discussed the procedure including the risks, benefits and alternatives for the proposed anesthesia with the patient or authorized representative who has indicated his/her understanding and acceptance.     Dental advisory given  Plan Discussed with:   Anesthesia Plan Comments:         Anesthesia Quick  Evaluation

## 2019-05-17 NOTE — Transfer of Care (Signed)
Immediate Anesthesia Transfer of Care Note  Patient: Zachary Jacobs  Procedure(s) Performed: NEPHROLITHOTOMY PERCUTANEOUS (Left )  Patient Location: PACU  Anesthesia Type:General  Level of Consciousness: awake, drowsy, patient cooperative and responds to stimulation  Airway & Oxygen Therapy: Patient Spontanous Breathing and Patient connected to face mask oxygen  Post-op Assessment: Report given to RN and Post -op Vital signs reviewed and stable  Post vital signs: Reviewed and stable  Last Vitals:  Vitals Value Taken Time  BP 134/57 05/17/19 1346  Temp    Pulse 69 05/17/19 1348  Resp 15 05/17/19 1348  SpO2 100 % 05/17/19 1348  Vitals shown include unvalidated device data.  Last Pain:  Vitals:   05/17/19 1054  TempSrc:   PainSc: 0-No pain         Complications: No apparent anesthesia complications

## 2019-05-17 NOTE — Anesthesia Postprocedure Evaluation (Signed)
Anesthesia Post Note  Patient: Zachary Jacobs  Procedure(s) Performed: NEPHROLITHOTOMY PERCUTANEOUS (Left )     Patient location during evaluation: PACU Anesthesia Type: General Level of consciousness: sedated and patient cooperative Pain management: pain level controlled Vital Signs Assessment: post-procedure vital signs reviewed and stable Respiratory status: spontaneous breathing Cardiovascular status: stable Anesthetic complications: no    Last Vitals:  Vitals:   05/17/19 1346 05/17/19 1415  BP: (!) 134/57 126/77  Pulse: 75 65  Resp: 10 16  Temp: 36.5 C   SpO2: 99% 96%    Last Pain:  Vitals:   05/17/19 1430  TempSrc:   PainSc: Asleep                 Nolon Nations

## 2019-05-17 NOTE — Anesthesia Procedure Notes (Signed)
Procedure Name: Intubation Date/Time: 05/17/2019 11:30 AM Performed by: Garrel Ridgel, CRNA Pre-anesthesia Checklist: Patient identified Patient Re-evaluated:Patient Re-evaluated prior to induction Oxygen Delivery Method: Circle system utilized Preoxygenation: Pre-oxygenation with 100% oxygen Induction Type: IV induction Ventilation: Mask ventilation without difficulty Laryngoscope Size: Glidescope and 3 Grade View: Grade I Tube type: Oral Number of attempts: 1 Airway Equipment and Method: Stylet,  Oral airway and Video-laryngoscopy Placement Confirmation: ETT inserted through vocal cords under direct vision,  positive ETCO2 and breath sounds checked- equal and bilateral Secured at: 23 cm Tube secured with: Tape Dental Injury: Teeth and Oropharynx as per pre-operative assessment

## 2019-05-17 NOTE — Op Note (Addendum)
PATIENT:  Zachary Jacobs  PRE-OPERATIVE DIAGNOSIS: Left partial staghorn calculus  POST-OPERATIVE DIAGNOSIS: Same  PROCEDURE: 1. Percutaneous nephrostomy sheath placement. 2.  Right/left percutaneous nephrolithotomy (2.6 cm.) 3. Antegrade double-J stent placement.  SURGEON:  Claybon Jabs  INDICATION: Zachary Jacobs is a 64 year old male with a branched partial staghorn calculus in the lower pole of his left kidney.  His past urine cultures have shown positivity for a urease producing organism although his previous stone analyses were not consistent with struvite stone.  Due to the large size of his stone we discussed the options for management and he is elected to proceed with PCNL.  ANESTHESIA:  General  EBL: 100 cc  DRAINS: 6 French, 26 cm double-J stent in the left ureter and a 20 [place] council catheter as a nephrostomy tube.  LOCAL MEDICATIONS USED: 20 cc of half percent plain Marcaine  SPECIMEN: Stone given to patient  Description of procedure: After informed consent the patient was taken to the operating room and administered general endotracheal anesthesia. Once fully anesthetized the patient had an 26 French Foley catheter placed It was then moved from the stretcher onto the operating room table in a prone position with bony prominences padded and chest pads in place. The flank with exiting nephrostomy catheter was then sterilely prepped and draped in standard fashion. An official timeout was then performed.  Using the existing nephrostomy catheter access I passed a 0.038 inch floppy tipped guidewire down the ureter into the bladder under fluoroscopy.  This was left in place and the nephrostomy catheter was removed.  A transverse incision was made over the guidewire and a peel-away coaxial catheter was then passed over the guidewire and down the ureter under fluoroscopy.  The inner portion of the coaxial system was then removed and a second guidewire was passed through this  catheter and down the ureter into the bladder under fluoroscopy.  The coaxial catheter was then removed and one of the guidewires was secured to the drape as a safety guidewire and the second guidewire was used as a working guidewire.  The NephroMax nephrostomy dilating balloon was then passed over the working guidewire into the area of the renal pelvis under fluoroscopy.  It was then inflated using dilute contrast under fluoroscopy until the balloon was fully inflated.  I then passed the 28 French nephrostomy access sheath over the balloon into the area of the renal pelvis under fluoroscopy and then deflated the balloon and removed the dilating balloon.  The 24 French rigid nephroscope was then passed under direct vision through the nephrostomy access sheath.  Clotted blood was evacuated and the stone was identified.  I first grabbed stones that were free-floating and small enough to be withdrawn through the sheath with 2 prong graspers and then used the lithoclast to fragment the large stone into smaller pieces which were then grasped with 2 prong graspers as well.  Once all stone had been removed from the renal pelvic region and no further stone was visible I switched to a flexible cystoscope.  When I passed this and I was able to advance it into the upper pole and saw no stones up there.  I looked in the renal pelvis and down the ureter and saw no stones in that location and then with fluoroscopy could see some stones in the lower pole so I was able to direct the flexible cystoscope into the lower pole and grasped the stones with a 0 tip basket.  Further inspection  revealed no definite stones could be identified anywhere.  Throughout the procedure there was persistent bleeding that could be seen coming from the parenchyma.  I therefore elected to first place the stent which was placed by back loading the guidewire through the rigid nephroscope and then I passed the stent down the ureter into the bladder  over the guidewire under fluoroscopy.  When I remove the guidewire there was good curl noted in the bladder and then I used a 2 prong grasper to place the stent in the upper pole of the kidney so it traversed the renal pelvis.  I then switched to the flexible cystoscope and passed this into the upper pole and used a sensor guidewire through the flexible cystoscope and into the upper pole where this was left as the flexible cystoscope was removed.  Over this guidewire I then passed to the 33 [place] council tip catheter and fluoroscopically this was confirmed to be in the upper pole.  I inflated the balloon with approximately 3 cc of dilute contrast.  I then removed the safety guidewire under fluoroscopy and also removed the nephrostomy sheath.  I sewed the nephrostomy tube to the skin with a 0 silk suture in a figure-of-eight fashion after injecting local anesthetic into the wound.  The catheter was connected to closed system drainage and the patient was awakened and taken to the recovery room in stable and satisfactory condition after an occlusive, sterile dressing was applied to the nephrostomy tube site.   PLAN OF CARE: Discharge to home after an overnight stay.  PATIENT DISPOSITION:  PACU - hemodynamically stable.

## 2019-05-17 NOTE — H&P (Signed)
Chief Complaint: Patient was seen in consultation today for (L)PCN/ at the request of [place]  Referring Physician(s): Kathie Rhodes  Supervising Physician: Daryll Brod  Patient Status: Zachary Jacobs  History of Present Illness: Zachary Jacobs is a 64 y.o. male with recurrent UTIs and left sided renal stone. He is scheduled for PCNL today with Dr. Karsten Ro and IR is asked to place PCN/nephroureteral access. He is familiar with this procedure as he underwent the same in 2016. PMHx, meds, labs, imaging, allergies reviewed. Feels well, no recent fevers, chills, illness. Has been NPO today as directed.   Past Medical History:  Diagnosis Date  . Allergy   . Anemia    as child  . COPD, mild ([place])    very mild  . History of kidney stones   . OSA (obstructive sleep apnea)    s/p  surgery 1996  . Pneumonia   . Pre-diabetes   . Renal calculus, left   . Renal cyst, right    SIMPLE  . Sleep apnea     cpap   dont wear latley    Past Surgical History:  Procedure Laterality Date  . CYSTO/  RIGHT URETEROSCOPY/  STENT PLACEMENT  10/ 2001  . CYSTOSCOPY/URETEROSCOPY/HOLMIUM LASER/STENT PLACEMENT Left 03/30/2015   Procedure: LEFT URETEROSCOPY/HOLMIUM LASER LITHOTRIPSY/STENT PLACEMENT, WITH  STONE BASKET EXTRACTION;  Surgeon: Kathie Rhodes, MD;  Location: [facility];  Service: Urology;  Laterality: Left;  . HOLMIUM LASER APPLICATION Left [id number]   Procedure: HOLMIUM LASER APPLICATION;  Surgeon: Kathie Rhodes, MD;  Location: [facility];  Service: Urology;  Laterality: Left;  . NEPHROLITHOTOMY Left 03/20/2015   Procedure: NEPHROLITHOTOMY PERCUTANEOUS;  Surgeon: Kathie Rhodes, MD;  Location: [facility];  Service: Urology;  Laterality: Left;  . TONSILLECTOMY     as child  . UVULOPALATOPHARYNGOPLASTY  1996   nasal surgery    Allergies: Patient has no known allergies.  Medications: Prior to Admission medications   Medication Sig Start Date End  Date Taking? Authorizing Provider  albuterol (PROVENTIL HFA;VENTOLIN HFA) 108 (90 BASE) MCG/ACT inhaler Inhale 2 puffs into the lungs every 6 (six) hours as needed for wheezing or shortness of breath. 07/29/14   Copland, Gay Filler, MD  APPLE CIDER VINEGAR PO Take 1-2 tablets by mouth 4 (four) times a week. [place] Sales promotion account executive, Historical, MD  benzonatate (TESSALON) 100 MG capsule Take 1-2 capsules (100-200 mg total) by mouth 3 (three) times daily as needed for cough. Patient not taking: Reported on 05/10/2019 02/25/19   McVey, Gelene Mink, PA-C  Cholecalciferol (VITAMIN D3) 50 MCG (2000 UT) TABS Take 2,000 Units by mouth daily.    [provider]  levocetirizine (XYZAL) 5 MG tablet Take 5 mg by mouth at bedtime.    [provider]  levofloxacin (LEVAQUIN) 500 MG tablet Take 250 mg by mouth daily.    [provider]  predniSONE (DELTASONE) 5 MG tablet Day 1: 2 tablets before breakfast, 1 after both lunch & dinner and 2 at bedtime; Day 2: 1 tab before breakfast, 1 after both lunch & dinner and 2 at bedtime; Day 3: 1 tab at each meal & 1 at bedtime; Day 4: 1 tab at breakfast, 1 at lunch, 1 at bedtime; Day 5: 1 tab at breakfast & 1 tab at bedtime; Day 6: 1 tab at breakfast Patient not taking: Reported on 05/10/2019 02/25/19   McVey, Gelene Mink, PA-C  tamsulosin (FLOMAX) 0.4 MG CAPS capsule Take 0.4  mg by mouth every evening.     [provider]     Family History  Problem Relation Age of Onset  . Cancer Mother   . Heart disease Father   . Colon polyps Father   . Cancer Other   . Obesity Other   . Colon cancer Neg Hx   . Esophageal cancer Neg Hx   . Rectal cancer Neg Hx   . Stomach cancer Neg Hx     Social History   Socioeconomic History  . Marital status: Married    Spouse name: Not on file  . Number of children: Not on file  . Years of education: Not on file  . Highest education level: Not on file  Occupational History  .  Not on file  Tobacco Use  . Smoking status: Former Smoker    Packs/day: 1.00    Years: 20.00    Pack years: 20.00    Types: Cigarettes    Quit date: 03/27/1984    Years since quitting: 35.1  . Smokeless tobacco: Never Used  Substance and Sexual Activity  . Alcohol use: No  . Drug use: No  . Sexual activity: Not Currently  Other Topics Concern  . Not on file  Social History Narrative  . Not on file   Social Determinants of Health   Financial Resource Strain:   . Difficulty of Paying Living Expenses: Not on file  Food Insecurity:   . Worried About Charity fundraiser in the Last Year: Not on file  . Ran Out of Food in the Last Year: Not on file  Transportation Needs:   . Lack of Transportation (Medical): Not on file  . Lack of Transportation (Non-Medical): Not on file  Physical Activity:   . Days of Exercise per Week: Not on file  . Minutes of Exercise per Session: Not on file  Stress:   . Feeling of Stress : Not on file  Social Connections:   . Frequency of Communication with Friends and Family: Not on file  . Frequency of Social Gatherings with Friends and Family: Not on file  . Attends Religious Services: Not on file  . Active Member of Clubs or Organizations: Not on file  . Attends Archivist Meetings: Not on file  . Marital Status: Not on file     Review of Systems: A 12 point ROS discussed and pertinent positives are indicated in the HPI above.  All other systems are negative.  Review of Systems  Vital Signs: There were no vitals taken for this visit.  Physical Exam Constitutional:      Appearance: Normal appearance. He is obese. He is not ill-appearing.  HENT:     Head: Normocephalic.     Mouth/Throat:     Mouth: Mucous membranes are moist.     Pharynx: Oropharynx is clear.  Cardiovascular:     Rate and Rhythm: Normal rate and regular rhythm.     Heart sounds: Normal heart sounds.  Pulmonary:     Effort: Pulmonary effort is normal. No  respiratory distress.     Breath sounds: Normal breath sounds.  Abdominal:     General: Abdomen is flat. There is no distension.     Palpations: Abdomen is soft.     Tenderness: There is no abdominal tenderness.  Skin:    General: Skin is warm and dry.  Neurological:     Mental Status: He is alert and oriented to person, place, and time.  Psychiatric:  Mood and Affect: Mood normal.        Thought Content: Thought content normal.        Judgment: Judgment normal.     Imaging: No results found.  Labs:  CBC: Recent Labs    05/13/19 0952  WBC 9.8  HGB 15.1  HCT 46.5  PLT 297    COAGS: No results for input(s): INR, APTT in the last 8760 hours.  BMP: Recent Labs    05/13/19 0952  NA 138  K 4.5  CL 104  CO2 26  GLUCOSE 114*  BUN 17  CALCIUM 8.3*  CREATININE 0.98  GFRNONAA >60  GFRAA >60    LIVER FUNCTION TESTS: No results for input(s): BILITOT, AST, ALT, ALKPHOS, PROT, ALBUMIN in the last 8760 hours.  TUMOR MARKERS: No results for input(s): AFPTM, CEA, CA199, CHROMGRNA in the last 8760 hours.  Assessment and Plan: (L)renal stone with recurrent UTIs For IR placement of (L)PCN/nephroureteral Labs pending Risks and benefits of left PCN placement was discussed with the patient including, but not limited to, infection, bleeding, significant bleeding causing loss or decrease in renal function or damage to adjacent structures.   All of the patient's questions were answered, patient is agreeable to proceed.  Consent signed and in chart.    Thank you for this interesting consult.  I greatly enjoyed meeting Zachary Jacobs and look forward to participating in their care.  A copy of this report was sent to the requesting provider on this date.  Electronically Signed: Ascencion Dike, PA-C 05/17/2019, 8:29 AM   I spent a total of 20 minutes in face to face in clinical consultation, greater than 50% of which was counseling/coordinating care for left PCN

## 2019-05-18 DIAGNOSIS — N200 Calculus of kidney: Secondary | ICD-10-CM | POA: Diagnosis not present

## 2019-05-18 DIAGNOSIS — J449 Chronic obstructive pulmonary disease, unspecified: Secondary | ICD-10-CM | POA: Diagnosis not present

## 2019-05-18 DIAGNOSIS — Z7951 Long term (current) use of inhaled steroids: Secondary | ICD-10-CM | POA: Diagnosis not present

## 2019-05-18 DIAGNOSIS — Z79899 Other long term (current) drug therapy: Secondary | ICD-10-CM | POA: Diagnosis not present

## 2019-05-18 DIAGNOSIS — G4730 Sleep apnea, unspecified: Secondary | ICD-10-CM | POA: Diagnosis not present

## 2019-05-18 DIAGNOSIS — Z87891 Personal history of nicotine dependence: Secondary | ICD-10-CM | POA: Diagnosis not present

## 2019-05-18 DIAGNOSIS — Z87442 Personal history of urinary calculi: Secondary | ICD-10-CM | POA: Diagnosis not present

## 2019-05-18 DIAGNOSIS — Z881 Allergy status to other antibiotic agents status: Secondary | ICD-10-CM | POA: Diagnosis not present

## 2019-05-18 MED ORDER — OXYCODONE HCL 10 MG PO TABS: 10.0000 mg | ORAL_TABLET | Freq: Four times a day (QID) | ORAL | 0 refills | Status: DC | PRN

## 2019-05-18 MED FILL — oxyCODONE HCL 10 MG TABS: 10 | 3 days supply | Qty: 12 | Fill #0

## 2019-05-18 NOTE — Discharge Summary (Signed)
Physician Discharge Summary      Patient ID: BARTOSZ STRENGER MRN: [id number] DOB/AGE: [birth year] 63 y.o.  Admit date: 05/17/2019 Discharge date: 05/18/2019  Admission Diagnoses: Staghorn renal calculus [N20.0]  Discharge Diagnoses:  Active Problems:   Staghorn renal calculus   Discharged Condition: good  Hospital Course: The patient had a 2.6 cm branched, partial staghorn calculus in the lower pole of his left kidney.  It had been causing discomfort.  He elected to proceed with treatment and due to its size the most appropriate therapy was considered a PCNL.  He underwent access by interventional Radiology and then was taken to the operating room 19/21 where he underwent his percutaneous nephrolithotomy without complication.  At the time of surgery no further stones could be identified.  There was some bleeding during the procedure and I therefore elected to leave a nephrostomy tube overnight to help control bleeding.  He did well overnight with some flank discomfort but the pain was not increasing.  He did experience some hematuria as to be expected and we discussed this.  He has a stent in place and his nephrostomy tube has been clamped and did not result in any increased pain or drainage from the nephrostomy site and therefore the tube was removed.  We did discuss the possibility of some urine draining from the nephrostomy site for a short time postoperatively but he has a stent in place.  A CT scan done postoperatively revealed 1-5 mm fragments remaining within the area of the renal pelvis that were not visible at the time of his procedure.  The stent is in good position with no perinephric fluid collections noted.  At this point his pain is controlled but he will be given a prescription for pain medication upon discharge and is felt ready for discharge at this time.  He will follow up in the office for stent removal.  Discharge Exam: Blood pressure 140/82, pulse 62, temperature 97.6 F  (36.4 C), temperature source Oral, resp. rate 18, height 5\' 10"  (1.778 m), weight 128.2 kg, SpO2 100 %. General: Awake, alert and in no apparent distress. Chest: Normal respiratory effort. Cardiovascular: Regular rate and rhythm. Abdomen: Soft, nontender, nondistended. Flank:  Nephrostomy site appears free of any sign of infection with no discharge/drainage.   Disposition: Discharge disposition: 01-Home or Self Care       Discharge Instructions    Discharge patient   Complete by: As directed    Discharge disposition: 01-Home or Self Care   Discharge patient date: 05/18/2019   Nursing communication   Complete by: As directed    Nephrostomy tube was clamped at 7:00 a.m.Marland Kitchen  If the patient is free of any new pain or any increase in drainage from his nephrostomy site then his nephrostomy tube may be removed by draining the 3 cc in the balloon and removing the suture.  A dry dressing can be applied with a Tegaderm.     Allergies as of 05/18/2019   No Known Allergies     Medication List    STOP taking these medications   benzonatate 100 MG capsule Commonly known as: TESSALON   predniSONE 5 MG tablet Commonly known as: DELTASONE     TAKE these medications   albuterol 108 (90 Base) MCG/ACT inhaler Commonly known as: VENTOLIN HFA Inhale 2 puffs into the lungs every 6 (six) hours as needed for wheezing or shortness of breath.   APPLE CIDER VINEGAR PO Take 1-2 tablets by mouth 4 (four) times a  week. [place] Apple Cider Vinegar   levocetirizine 5 MG tablet Commonly known as: XYZAL Take 5 mg by mouth at bedtime.   levofloxacin 500 MG tablet Commonly known as: LEVAQUIN Take 250 mg by mouth daily.   Oxycodone HCl 10 MG Tabs Take 1 tablet (10 mg total) by mouth every 6 (six) hours as needed.   tamsulosin 0.4 MG Caps capsule Commonly known as: FLOMAX Take 0.4 mg by mouth every evening.   Vitamin D3 50 MCG (2000 UT) Tabs Take 2,000 Units by mouth daily.      Follow-up  Information    [facility]. Call on 05/24/2019.   Why: For your appiontment at 8:30 Contact information: [place]          Signed: Claybon Jabs 05/18/2019, 10:46 AM

## 2019-05-24 DIAGNOSIS — N200 Calculus of kidney: Secondary | ICD-10-CM | POA: Diagnosis not present

## 2019-07-01 MED FILL — TAMSULOSIN HCL 0.4 MG CAP: 0.4 | 30 days supply | Qty: 60 | Fill #5

## 2019-07-06 DIAGNOSIS — R8271 Bacteriuria: Secondary | ICD-10-CM | POA: Diagnosis not present

## 2019-07-06 DIAGNOSIS — N200 Calculus of kidney: Secondary | ICD-10-CM | POA: Diagnosis not present

## 2019-07-06 DIAGNOSIS — R3914 Feeling of incomplete bladder emptying: Secondary | ICD-10-CM | POA: Diagnosis not present

## 2019-08-07 ENCOUNTER — Ambulatory Visit (INDEPENDENT_AMBULATORY_CARE_PROVIDER_SITE_OTHER)
Admission: RE | Admit: 2019-08-07 | Discharge: 2019-08-07 | Disposition: A | Discharge status: Home / Self Care | Payer: 59 | Source: Ambulatory Visit

## 2019-08-07 DIAGNOSIS — H1013 Acute atopic conjunctivitis, bilateral: Secondary | ICD-10-CM

## 2019-08-07 MED ORDER — OLOPATADINE HCL 0.2 % OP SOLN: 1.0000 [drp] | Freq: Every day | OPHTHALMIC | 0 refills | Status: DC

## 2019-08-07 MED ORDER — LUBRICANT EYE DROPS 0.4-0.3 % OP SOLN: 1.0000 [drp] | Freq: Three times a day (TID) | OPHTHALMIC | 0 refills | Status: DC | PRN

## 2019-08-07 NOTE — ED Provider Notes (Signed)
Virtual Visit via Video Note:  Zachary Jacobs  initiated request for Telemedicine visit with [facility] team. I connected with Zachary Jacobs  on 08/07/2019 at 10:32 AM  for a synchronized telemedicine visit using a video enabled HIPPA compliant telemedicine application. I verified that I am speaking with Zachary Jacobs  using two identifiers. [place] Hall-Potvin, PA-C  was physically located in a [place] [facility]gent [facility] site and Zachary Jacobs was located at a different location.   The limitations of evaluation and management by telemedicine as well as the availability of in-person appointments were discussed. Patient was informed that he  may incur a bill ( including co-pay) for this virtual visit encounter. Zachary Jacobs  expressed understanding and gave verbal consent to proceed with virtual visit.     History of Present Illness:Zachary Jacobs  is a 64 y.o. male presents with bilateral eye redness, itching, watering.  States it began left eye 5 days ago, right eye 2 days later.  Patient denying pain, foreign body sensation, change in vision.  Patient works as a Statistician: Wears PPE and denies known foreign body exposure.  Patient does wear eyeglasses, no contact lenses.  States he tried cleaning his eyes with a washcloth.  Has had scant clear discharge.  No eyelid swelling, ear pain, painful eye movements, nasal congestion.   ROI as per HPI  Past Medical History:  Diagnosis Date  . Allergy   . Anemia    as child  . COPD, mild ([place])    very mild  . History of kidney stones   . OSA (obstructive sleep apnea)    s/p  surgery 1996  . Pneumonia   . Pre-diabetes   . Renal calculus, left   . Renal cyst, right    SIMPLE  . Sleep apnea     cpap   dont wear latley    No Known Allergies      Observations/Objective: 64 year old male Sitting in no acute distress.  Patient is able to speak in full sentences without coughing, sneezing, wheezing.   No periorbital edema, bruising, redness.  EOMs intact.  Patient denies TTP of eyes bilaterally.  Assessment and Plan: H&P consistent with viral versus allergic conjunctivitis.  Will treat supportively with antihistamine drops, lubricant drops as needed as well as hot compresses.  Return precautions discussed, patient verbalized understanding and is agreeable to plan.  Follow Up Instructions: Patient to seek in-person evaluation for persistent, worsening symptoms.   I discussed the assessment and treatment plan with the patient. The patient was provided an opportunity to ask questions and all were answered. The patient agreed with the plan and demonstrated an understanding of the instructions.   The patient was advised to call back or seek an in-person evaluation if the symptoms worsen or if the condition fails to improve as anticipated.  I provided 15 minutes of non-face-to-face time during this encounter.    [place], PA-C  08/07/2019 10:32 AM        Hall-Potvin, [place], PA-C 08/07/19 1032

## 2019-08-07 NOTE — Discharge Instructions (Signed)
Use allergy eyedrops as directed on both eye(s) as prescribed.  May use artificial tear gel/drops at needed. Important to use artificial tear gel/drops last and wait 10-15 minutes between drops as it can prevent your prescription drops from working properly.  Important to follow up with Ophthalmology (eye doctor). Return sooner for worsening of symptoms, change in vision, sensitivity to light, eye swelling, painful eye movement, or fever.

## 2019-08-18 MED FILL — TAMSULOSIN HCL 0.4 MG CAP: 0.4 | 30 days supply | Qty: 60 | Fill #6

## 2019-08-23 DIAGNOSIS — N200 Calculus of kidney: Secondary | ICD-10-CM | POA: Diagnosis not present

## 2019-09-21 MED FILL — TAMSULOSIN HCL 0.4 MG CAP: 0.4 | 30 days supply | Qty: 60 | Fill #7

## 2019-11-04 MED FILL — TAMSULOSIN HCL 0.4 MG CAP: 0.4 | 30 days supply | Qty: 60 | Fill #8

## 2019-12-20 ENCOUNTER — Telehealth: Payer: 59 | Admitting: Family

## 2019-12-20 DIAGNOSIS — J4521 Mild intermittent asthma with (acute) exacerbation: Secondary | ICD-10-CM

## 2019-12-20 MED ORDER — PREDNISONE 20 MG PO TABS: 40.0000 mg | ORAL_TABLET | Freq: Every day | ORAL | 0 refills | Status: AC

## 2019-12-20 NOTE — Progress Notes (Signed)
Visit for Asthma  Based on what you have shared with me, it looks like you may have a flare up of your asthma.  Asthma is a chronic (ongoing) lung disease which results in airway obstruction, inflammation and hyper-responsiveness.   Asthma symptoms vary from person to person, with common symptoms including nighttime awakening and decreased ability to participate in normal activities as a result of shortness of breath. It is often triggered by changes in weather, changes in the season, changes in air temperature, or inside (home, school, daycare or work) allergens such as animal dander, mold, mildew, woodstoves or cockroaches.   It can also be triggered by hormonal changes, extreme emotion, physical exertion or an upper respiratory tract illness.     It is important to identify the trigger, and then eliminate or avoid the trigger if possible.   If you have been prescribed medications to be taken on a regular basis, it is important to follow the asthma action plan and to follow guidelines to adjust medication in response to increasing symptoms of decreased peak expiratory flow rate  Treatment: I have prescribed: Prednisone 40mg  by mouth per day for  7 days.  HOME CARE  Only take medications as instructed by your medical team.  Consider wearing a mask or scarf to improve breathing air temperature have been shown to decrease irritation and decrease exacerbations  Get rest.  Taking a steamy shower or using a humidifier may help nasal congestion sand ease sore throat pain. You can place a towel over your head and breathe in the steam from hot water coming from a faucet.  Using a saline nasal spray works much the same way.   Cough drops, hare candies and sore throat lozenges may ease your cough.   Avoid close contacts especially the very you and the elderly  Cover your mouth if you  cough or sneeze  Always remember to wash your hands.    GET HELP RIGHT AWAY IF:  You develop worsening symptoms; breathlessness at rest, drowsy, confused or agitated, unable to speak in full sentences  You have coughing fits  You develop a severe headache or visual changes  You develop shortness of breath, difficulty breathing or start having chest pain  Your symptoms persist after you have completed your treatment plan  If your symptoms do not improve within 10 days  MAKE SURE YOU  Understand these instructions.  Will watch your condition.  Will get help right away if you are not doing well or get worse.   Your e-visit answers were reviewed by a board certified advanced clinical practitioner to complete your personal care plan, Depending upon the condition, your plan could have included both over the counter or prescription medications.  Please review your pharmacy choice. Your safety is important to [place]. If you have drug allergies check your prescription carefully. You can use MyChart to ask questions about today's visit, request a non-urgent call back, or ask for a work or school excuse for 24 hours related to this e-Visit. If it has been greater than 24 hours you will need to follow up with your provider, or enter a new e-Visit to address those concerns.  You will get an e-mail in the next two days asking about your experience. I hope that your e-visit has been valuable and will speed your recovery. Thank you for using e-visits.  Approximately 5 minutes was spent documenting and reviewing patient's chart.

## 2019-12-27 ENCOUNTER — Other Ambulatory Visit (HOSPITAL_COMMUNITY): Payer: Self-pay | Admitting: Urology

## 2019-12-27 DIAGNOSIS — N200 Calculus of kidney: Secondary | ICD-10-CM | POA: Diagnosis not present

## 2019-12-27 DIAGNOSIS — R3914 Feeling of incomplete bladder emptying: Secondary | ICD-10-CM | POA: Diagnosis not present

## 2019-12-27 DIAGNOSIS — N401 Enlarged prostate with lower urinary tract symptoms: Secondary | ICD-10-CM | POA: Diagnosis not present

## 2019-12-27 MED FILL — TAMSULOSIN HCL 0.4 MG CAP: 0.4 | 90 days supply | Qty: 180 | Fill #0

## 2020-03-27 ENCOUNTER — Other Ambulatory Visit: Payer: Self-pay | Admitting: Emergency Medicine

## 2020-03-27 ENCOUNTER — Telehealth: Payer: 59 | Admitting: Emergency Medicine

## 2020-03-27 DIAGNOSIS — R062 Wheezing: Secondary | ICD-10-CM | POA: Diagnosis not present

## 2020-03-27 MED ORDER — PREDNISONE 20 MG PO TABS: 40.0000 mg | ORAL_TABLET | Freq: Every day | ORAL | 0 refills | Status: DC

## 2020-03-27 MED FILL — predniSONE 20 MG TABS: 20 | 7 days supply | Qty: 14 | Fill #0

## 2020-03-27 NOTE — Progress Notes (Signed)
Time spent: 10 min  Based on what you have shared with me, it looks like you may have a flare up of your asthma.  Asthma is a chronic (ongoing) lung disease which results in airway obstruction, inflammation and hyper-responsiveness.   Asthma symptoms vary from person to person, with common symptoms including nighttime awakening and decreased ability to participate in normal activities as a result of shortness of breath. It is often triggered by changes in weather, changes in the season, changes in air temperature, or inside (home, school, daycare or work) allergens such as animal dander, mold, mildew, woodstoves or cockroaches.   It can also be triggered by hormonal changes, extreme emotion, physical exertion or an upper respiratory tract illness.     It is important to identify the trigger, and then eliminate or avoid the trigger if possible.   If you have been prescribed medications to be taken on a regular basis, it is important to follow the asthma action plan and to follow guidelines to adjust medication in response to increasing symptoms of decreased peak expiratory flow rate  Treatment: I have prescribed: Prednisone 40mg  by mouth per day for 5 - 7 days. Continue using albuterol inhaler as needed.   HOME CARE . Only take medications as instructed by your medical team. . Consider wearing a mask or scarf to improve breathing air temperature have been shown to decrease irritation and decrease exacerbations . Get rest. . Taking a steamy shower or using a humidifier may help nasal congestion sand ease sore throat pain. You can place a towel over your head and breathe in the steam from hot water coming from a faucet. . Using a saline nasal spray works much the same way.  . Cough drops, hare candies and sore throat lozenges may ease your cough.  . Avoid close contacts especially the very you and the elderly . Cover your mouth if you cough or sneeze . Always remember to wash your hands.    GET  HELP RIGHT AWAY IF: . You develop worsening symptoms; breathlessness at rest, drowsy, confused or agitated, unable to speak in full sentences . You have coughing fits . You develop a severe headache or visual changes . You develop shortness of breath, difficulty breathing or start having chest pain . Your symptoms persist after you have completed your treatment plan . If your symptoms do not improve within 10 days  MAKE SURE YOU . Understand these instructions. . Will watch your condition. . Will get help right away if you are not doing well or get worse.   Your e-visit answers were reviewed by a board certified advanced clinical practitioner to complete your personal care plan, Depending upon the condition, your plan could have included both over the counter or prescription medications.  Please review your pharmacy choice. Your safety is important to [place]. If you have drug allergies check your prescription carefully. You can use MyChart to ask questions about today's visit, request a non-urgent call back, or ask for a work or school excuse for 24 hours related to this e-Visit. If it has been greater than 24 hours you will need to follow up with your provider, or enter a new e-Visit to address those concerns.  You will get an e-mail in the next two days asking about your experience. I hope that your e-visit has been valuable and will speed your recovery. Thank you for using e-visits.

## 2020-03-28 DIAGNOSIS — N200 Calculus of kidney: Secondary | ICD-10-CM | POA: Diagnosis not present

## 2020-04-12 ENCOUNTER — Telehealth: Payer: 59 | Admitting: Emergency Medicine

## 2020-04-12 ENCOUNTER — Other Ambulatory Visit: Payer: Self-pay | Admitting: Emergency Medicine

## 2020-04-12 DIAGNOSIS — J45901 Unspecified asthma with (acute) exacerbation: Secondary | ICD-10-CM

## 2020-04-12 MED ORDER — ALBUTEROL SULFATE HFA 108 (90 BASE) MCG/ACT IN AERS: 2.0000 | INHALATION_SPRAY | RESPIRATORY_TRACT | 0 refills | Status: DC | PRN

## 2020-04-12 MED ORDER — PREDNISONE 20 MG PO TABS: 40.0000 mg | ORAL_TABLET | Freq: Every day | ORAL | 0 refills | Status: DC

## 2020-04-12 MED ORDER — AEROCHAMBER PLUS FLO-VU LARGE MISC: 1.0000 | Freq: Once | 0 refills | Status: DC

## 2020-04-12 MED FILL — AEROCHAMBER WITH MASK-LARGE: 30 days supply | Qty: 1 | Fill #0

## 2020-04-12 MED FILL — predniSONE 20 MG TABS: 20 | 5 days supply | Qty: 10 | Fill #0

## 2020-04-12 MED FILL — ALBUTEROL SULFATE HFA 108 (: 108 (90 BAS | 17 days supply | Qty: 9 | Fill #0

## 2020-04-12 NOTE — Addendum Note (Signed)
Addended by: Montine Circle B on: 04/12/2020 09:36 AM   Modules accepted: Orders

## 2020-04-12 NOTE — Progress Notes (Signed)
Visit for Asthma  Based on what you have shared with me, it looks like you may have a flare up of your asthma.  Asthma is a chronic (ongoing) lung disease which results in airway obstruction, inflammation and hyper-responsiveness.   Asthma symptoms vary from person to person, with common symptoms including nighttime awakening and decreased ability to participate in normal activities as a result of shortness of breath. It is often triggered by changes in weather, changes in the season, changes in air temperature, or inside (home, school, daycare or work) allergens such as animal dander, mold, mildew, woodstoves or cockroaches.   It can also be triggered by hormonal changes, extreme emotion, physical exertion or an upper respiratory tract illness.     It is important to identify the trigger, and then eliminate or avoid the trigger if possible.   If you have been prescribed medications to be taken on a regular basis, it is important to follow the asthma action plan and to follow guidelines to adjust medication in response to increasing symptoms of decreased peak expiratory flow rate  Treatment: I have prescribed: 1. Albuterol (Proventil HFA; Ventolin HFA) 108 (90 Base) MCG/ACT Inhaler 2 puffs into the lungs every six hours as needed for wheezing or shortness of breath  2. Spacer  HOME CARE . Only take medications as instructed by your medical team. . Consider wearing a mask or scarf to improve breathing air temperature have been shown to decrease irritation and decrease exacerbations . Get rest. . Taking a steamy shower or using a humidifier may help nasal congestion sand ease sore throat pain. You can place a towel over your head and breathe in the steam from hot water coming from a faucet. . Using a saline nasal spray works much the same way.  . Cough drops, hare candies and sore throat  lozenges may ease your cough.  . Avoid close contacts especially the very you and the elderly . Cover your mouth if you cough or sneeze . Always remember to wash your hands.    GET HELP RIGHT AWAY IF: . You develop worsening symptoms; breathlessness at rest, drowsy, confused or agitated, unable to speak in full sentences . You have coughing fits . You develop a severe headache or visual changes . You develop shortness of breath, difficulty breathing or start having chest pain . Your symptoms persist after you have completed your treatment plan . If your symptoms do not improve within 10 days  MAKE SURE YOU . Understand these instructions. . Will watch your condition. . Will get help right away if you are not doing well or get worse.   Your e-visit answers were reviewed by a board certified advanced clinical practitioner to complete your personal care plan, Depending upon the condition, your plan could have included both over the counter or prescription medications.  Please review your pharmacy choice. Your safety is important to [place]. If you have drug allergies check your prescription carefully. You can use MyChart to ask questions about today's visit, request a non-urgent call back, or ask for a work or school excuse for 24 hours related to this e-Visit. If it has been greater than 24 hours you will need to follow up with your provider, or enter a new e-Visit to address those concerns.  You will get an e-mail in the next two days asking about your experience. I hope that your e-visit has been valuable and will speed your recovery. Thank you for using e-visits.   Approximately  5 minutes was used in reviewing the patient's chart, questionnaire, prescribing medications, and documentation.

## 2020-05-09 ENCOUNTER — Other Ambulatory Visit: Payer: Self-pay

## 2020-05-09 ENCOUNTER — Telehealth: Payer: 59 | Admitting: Nurse Practitioner

## 2020-05-09 ENCOUNTER — Ambulatory Visit (INDEPENDENT_AMBULATORY_CARE_PROVIDER_SITE_OTHER): Payer: 59

## 2020-05-09 ENCOUNTER — Ambulatory Visit
Admission: EM | Admit: 2020-05-09 | Discharge: 2020-05-09 | Disposition: A | Discharge status: Home / Self Care | Payer: 59 | Attending: Emergency Medicine | Admitting: Emergency Medicine

## 2020-05-09 DIAGNOSIS — J4521 Mild intermittent asthma with (acute) exacerbation: Secondary | ICD-10-CM

## 2020-05-09 DIAGNOSIS — R0989 Other specified symptoms and signs involving the circulatory and respiratory systems: Secondary | ICD-10-CM | POA: Diagnosis not present

## 2020-05-09 DIAGNOSIS — R062 Wheezing: Secondary | ICD-10-CM | POA: Diagnosis not present

## 2020-05-09 DIAGNOSIS — J811 Chronic pulmonary edema: Secondary | ICD-10-CM | POA: Diagnosis not present

## 2020-05-09 MED ORDER — PREDNISONE 10 MG PO TABS: ORAL_TABLET | ORAL | 0 refills | Status: DC

## 2020-05-09 MED ORDER — METHYLPREDNISOLONE SODIUM SUCC 125 MG IJ SOLR
125.0000 mg | Freq: Once | INTRAMUSCULAR | Status: AC
  Administered 2020-05-09: 125 mg via INTRAMUSCULAR

## 2020-05-09 MED ORDER — BENZONATATE 200 MG PO CAPS: 200.0000 mg | ORAL_CAPSULE | Freq: Three times a day (TID) | ORAL | 0 refills | Status: AC | PRN

## 2020-05-09 NOTE — ED Provider Notes (Signed)
[facility]    CSN: [id number] Arrival date & time: 05/09/20  1545      History   Chief Complaint Chief Complaint  Patient presents with  . Wheezing    HPI Zachary Jacobs is a 65 y.o. male history of COPD, OSA, presenting today for evaluation of COPD exacerbation.  Reports increased wheezing recently.  Using albuterol inhaler without full relief of symptoms.  Has improved with steroids in the past, last on prednisone 12/16 -40 mg x 5 days.  Symptoms did improve, but over the past 2 days has had repeat wheezing shortness of breath and chest tightness.  Denies any fevers.  Mild URI symptoms.  In the past has been on Symbicort and Dulera, has not been on maintenance inhaler over the past 1 to 2 years  HPI  Past Medical History:  Diagnosis Date  . Allergy   . Anemia    as child  . COPD, mild ([place])    very mild  . History of kidney stones   . OSA (obstructive sleep apnea)    s/p  surgery 1996  . Pneumonia   . Pre-diabetes   . Renal calculus, left   . Renal cyst, right    SIMPLE  . Sleep apnea     cpap   dont wear latley    Patient Active Problem List   Diagnosis Date Noted  . Lower urinary tract symptoms (LUTS) 03/13/2017  . Acute UTI 03/13/2017  . History of kidney stones 03/13/2017  . Staghorn renal calculus 03/20/2015  . Hydronephrosis     Past Surgical History:  Procedure Laterality Date  . CYSTO/  RIGHT URETEROSCOPY/  STENT PLACEMENT  10/ 2001  . CYSTOSCOPY/URETEROSCOPY/HOLMIUM LASER/STENT PLACEMENT Left 03/30/2015   Procedure: LEFT URETEROSCOPY/HOLMIUM LASER LITHOTRIPSY/STENT PLACEMENT, WITH  STONE BASKET EXTRACTION;  Surgeon: Kathie Rhodes, MD;  Location: [facility];  Service: Urology;  Laterality: Left;  . HOLMIUM LASER APPLICATION Left [id number]   Procedure: HOLMIUM LASER APPLICATION;  Surgeon: Kathie Rhodes, MD;  [facility];  Service: Urology;  Laterality: Left;  . IR URETERAL STENT LEFT NEW ACCESS W/O  SEP NEPHROSTOMY CATH  05/17/2019  . NEPHROLITHOTOMY Left 03/20/2015   Procedure: NEPHROLITHOTOMY PERCUTANEOUS;  Surgeon: Kathie Rhodes, MD;  Location: [facility];  Service: Urology;  Laterality: Left;  . NEPHROLITHOTOMY Left 05/17/2019   Procedure: NEPHROLITHOTOMY PERCUTANEOUS;  Surgeon: Kathie Rhodes, MD;  Location: [facility];  Service: Urology;  Laterality: Left;  . TONSILLECTOMY     as child  . UVULOPALATOPHARYNGOPLASTY  1996   nasal surgery       Home Medications    Prior to Admission medications   Medication Sig Start Date End Date Taking? Authorizing Provider  benzonatate (TESSALON) 200 MG capsule Take 1 capsule (200 mg total) by mouth 3 (three) times daily as needed for up to 7 days for cough. 05/09/20 05/16/20 Yes Wieters, Hallie C, PA-C  predniSONE (DELTASONE) 10 MG tablet Begin with 6 tabs on day 1, 5 tab on day 2, 4 tab on day 3, 3 tab on day 4, 2 tab on day 5, 1 tab on day 6-take with food 05/09/20  Yes Wieters, Hallie C, PA-C  albuterol (PROVENTIL HFA;VENTOLIN HFA) 108 (90 BASE) MCG/ACT inhaler Inhale 2 puffs into the lungs every 6 (six) hours as needed for wheezing or shortness of breath. 07/29/14   Copland, Gay Filler, MD  albuterol (VENTOLIN HFA) 108 (90 Base) MCG/ACT inhaler Inhale 2 puffs into the lungs every 4 (  four) hours as needed for wheezing or shortness of breath. 04/12/20   Montine Circle, PA-C  APPLE CIDER VINEGAR PO Take 1-2 tablets by mouth 4 (four) times a week. [place] Sales promotion account executive, Historical, MD  Cholecalciferol (VITAMIN D3) 50 MCG (2000 UT) TABS Take 2,000 Units by mouth daily.    [provider]  levocetirizine (XYZAL) 5 MG tablet Take 5 mg by mouth at bedtime.    [provider]  Olopatadine HCl 0.2 % SOLN Apply 1 drop to eye daily. 08/07/19   Hall-Potvin, [place], PA-C  Oxycodone HCl 10 MG TABS Take 1 tablet (10 mg total) by mouth every 6 (six) hours as needed. 05/18/19   Kathie Rhodes, MD  Polyethyl Glycol-Propyl Glycol (LUBRICANT EYE  DROPS) 0.4-0.3 % SOLN Apply 1 drop to eye 3 (three) times daily as needed. 08/07/19   Hall-Potvin, [place], PA-C  tamsulosin (FLOMAX) 0.4 MG CAPS capsule Take 0.4 mg by mouth every evening.     [provider]    Family History Family History  Problem Relation Age of Onset  . Cancer Mother   . Heart disease Father   . Colon polyps Father   . Cancer Other   . Obesity Other   . Colon cancer Neg Hx   . Esophageal cancer Neg Hx   . Rectal cancer Neg Hx   . Stomach cancer Neg Hx     Social History Social History   Tobacco Use  . Smoking status: Former Smoker    Packs/day: 1.00    Years: 20.00    Pack years: 20.00    Types: Cigarettes    Quit date: 03/27/1984    Years since quitting: 36.1  . Smokeless tobacco: Never Used  Substance Use Topics  . Alcohol use: No  . Drug use: No     Allergies   Patient has no known allergies.   Review of Systems Review of Systems  Constitutional: Negative for activity change, appetite change, chills, fatigue and fever.  HENT: Positive for congestion and rhinorrhea. Negative for ear pain, sinus pressure, sore throat and trouble swallowing.   Eyes: Negative for discharge and redness.  Respiratory: Positive for cough and wheezing. Negative for chest tightness and shortness of breath.   Cardiovascular: Negative for chest pain.  Gastrointestinal: Negative for abdominal pain, diarrhea, nausea and vomiting.  Musculoskeletal: Negative for myalgias.  Skin: Negative for rash.  Neurological: Negative for dizziness, light-headedness and headaches.     Physical Exam Triage Vital Signs ED Triage Vitals  Enc Vitals Group     BP      Pulse      Resp      Temp      Temp src      SpO2      Weight      Height      Head Circumference      Peak Flow      Pain Score      Pain Loc      Pain Edu?      Excl. in [place]?    No data found.  Updated Vital Signs BP 140/84 (BP Location: Left Arm)   Pulse 66   Temp 98 F (36.7 C) (Oral)    Resp 18   SpO2 96%   Visual Acuity Right Eye Distance:   Left Eye Distance:   Bilateral Distance:    Right Eye Near:   Left Eye Near:    Bilateral Near:  Physical Exam Vitals and nursing note reviewed.  Constitutional:      Appearance: He is well-developed and well-nourished.     Comments: No acute distress  HENT:     Head: Normocephalic and atraumatic.     Ears:     Comments: Bilateral ears without tenderness to palpation of external auricle, tragus and mastoid, EAC's without erythema or swelling, TM's with good bony landmarks and cone of light. Non erythematous.     Nose: Nose normal.     Mouth/Throat:     Comments: Oral mucosa pink and moist, no tonsillar enlargement or exudate. Posterior pharynx patent and nonerythematous, no uvula deviation or swelling. Normal phonation. Eyes:     Conjunctiva/sclera: Conjunctivae normal.  Cardiovascular:     Rate and Rhythm: Normal rate.  Pulmonary:     Effort: Pulmonary effort is normal. No respiratory distress.     Comments: Inspiratory wheezing with expiratory rhonchi throughout bilateral lung fields anteriorly and posteriorly Abdominal:     General: There is no distension.  Musculoskeletal:        General: Normal range of motion.     Cervical back: Neck supple.  Skin:    General: Skin is warm and dry.  Neurological:     Mental Status: He is alert and oriented to person, place, and time.  Psychiatric:        Mood and Affect: Mood and affect normal.      UC Treatments / Results  Labs (all labs ordered are listed, but only abnormal results are displayed) Labs Reviewed - No data to display  EKG   Radiology DG Chest 2 View  Result Date: 05/09/2020 CLINICAL DATA:  Wheezing and rhonchi, more frequent exacerbations in past 1-2 months. EXAM: CHEST - 2 VIEW COMPARISON:  July 29, 2014 FINDINGS: Cardiomediastinal contours are stable. Question mild pulmonary vascular congestion with similar appearance to previous imaging.  Areas of linear scarring at the RIGHT and LEFT lung base with similar appearance. No lobar consolidative changes or evidence of pleural effusion. On limited assessment no acute skeletal process. IMPRESSION: Basilar scarring and hyperinflation without change. Query mild central pulmonary vascular congestion. Electronically Signed   By: Zetta Bills M.D.   On: 05/09/2020 17:08    Procedures Procedures (including critical care time)  Medications Ordered in UC Medications  methylPREDNISolone sodium succinate (SOLU-MEDROL) 125 mg/2 mL injection 125 mg (125 mg Intramuscular Given 05/09/20 1717)    Initial Impression / Assessment and Plan / UC Course  I have reviewed the triage vital signs and the nursing notes.  Pertinent labs & imaging results that were available during my care of the patient were reviewed by me and considered in my medical decision making (see chart for details).     Chest x-ray negative for pneumonia, questionable pulmonary vascular congestion, treating for reactive airway with Solu-Medrol prior to discharge, continuing on prednisone taper and continuing albuterol inhalers, Tessalon for cough.  Rest and fluids.  Discussed following up with primary care for further evaluation of more frequent exacerbations of wheezing of recently.  Discussed strict return precautions. Patient verbalized understanding and is agreeable with plan.  Final Clinical Impressions(s) / UC Diagnoses   Final diagnoses:  Mild intermittent reactive airway disease with acute exacerbation     Discharge Instructions     Chest x-ray without any signs of pneumonia We gave you a dose of Solu-Medrol Continue with prednisone taper over the next 6 days-begin in the morning Tessalon every 8 hours for cough Continue albuterol inhalers  as needed Follow-up if not improving or worsening    ED Prescriptions    Medication Sig Dispense Auth. Provider   predniSONE (DELTASONE) 10 MG tablet Begin with 6 tabs  on day 1, 5 tab on day 2, 4 tab on day 3, 3 tab on day 4, 2 tab on day 5, 1 tab on day 6-take with food 21 tablet Wieters, Hallie C, PA-C   benzonatate (TESSALON) 200 MG capsule Take 1 capsule (200 mg total) by mouth 3 (three) times daily as needed for up to 7 days for cough. 28 capsule Wieters, [place] C, PA-C     PDMP not reviewed this encounter.   Janith Lima, PA-C 05/09/20 1723

## 2020-05-09 NOTE — Progress Notes (Signed)
Based on what you shared with me, I feel your condition warrants further evaluation and I recommend that you be seen for a face to face visit.  Please contact your primary care physician practice to be seen. Many offices offer virtual options to be seen via video if you are not comfortable going in person to a medical facility at this time.  Based on what you shared with me it looks like you have reactive airway disease,that should be evaluated in a face to face office visit. You need to have some breathing tests done and evaluated for the right medication. You are needed your albuteterol inhaler to much as well as steroids.   If you do not have a PCP, [facility] offers a free physician referral service available at [phone]. Our trained staff has the experience, knowledge and resources to put you in touch with a physician who is right for you.   You also have the option of a video visit through [email or web address]  If you are having a true medical emergency please call 911.  NOTE: If you entered your credit card information for this eVisit, you will not be charged. You may see a "hold" on your card for the $35 but that hold will drop off and you will not have a charge processed.  Your e-visit answers were reviewed by a board certified advanced clinical practitioner to complete your personal care plan.  Thank you for using e-Visits.

## 2020-05-09 NOTE — ED Triage Notes (Signed)
Pt states had a coughing spelling with chest tightness and wheezing yesterday morning. States had another spell today. States using his inhaler more often. States had exacerbation in November and December, treated with steroids. No distress noted

## 2020-05-09 NOTE — Discharge Instructions (Addendum)
Chest x-ray without any signs of pneumonia We gave you a dose of Solu-Medrol Continue with prednisone taper over the next 6 days-begin in the morning Tessalon every 8 hours for cough Continue albuterol inhalers as needed Follow-up if not improving or worsening

## 2020-05-25 MED FILL — TAMSULOSIN HCL 0.4 MG CAP: 0.4 | 90 days supply | Qty: 180 | Fill #1

## 2020-05-30 ENCOUNTER — Other Ambulatory Visit: Payer: Self-pay

## 2020-05-30 ENCOUNTER — Ambulatory Visit (INDEPENDENT_AMBULATORY_CARE_PROVIDER_SITE_OTHER): Payer: 59

## 2020-05-30 ENCOUNTER — Other Ambulatory Visit: Payer: Self-pay | Admitting: Urgent Care

## 2020-05-30 ENCOUNTER — Ambulatory Visit
Admission: EM | Admit: 2020-05-30 | Discharge: 2020-05-30 | Disposition: A | Discharge status: Home / Self Care | Payer: 59 | Attending: Urgent Care | Admitting: Urgent Care

## 2020-05-30 DIAGNOSIS — R059 Cough, unspecified: Secondary | ICD-10-CM | POA: Diagnosis not present

## 2020-05-30 DIAGNOSIS — J449 Chronic obstructive pulmonary disease, unspecified: Secondary | ICD-10-CM | POA: Diagnosis not present

## 2020-05-30 DIAGNOSIS — R0989 Other specified symptoms and signs involving the circulatory and respiratory systems: Secondary | ICD-10-CM

## 2020-05-30 DIAGNOSIS — Z7689 Persons encountering health services in other specified circumstances: Secondary | ICD-10-CM | POA: Diagnosis not present

## 2020-05-30 DIAGNOSIS — J189 Pneumonia, unspecified organism: Secondary | ICD-10-CM

## 2020-05-30 DIAGNOSIS — R062 Wheezing: Secondary | ICD-10-CM

## 2020-05-30 DIAGNOSIS — I517 Cardiomegaly: Secondary | ICD-10-CM | POA: Diagnosis not present

## 2020-05-30 DIAGNOSIS — J9811 Atelectasis: Secondary | ICD-10-CM | POA: Diagnosis not present

## 2020-05-30 MED ORDER — BENZONATATE 100 MG PO CAPS: 100.0000 mg | ORAL_CAPSULE | Freq: Three times a day (TID) | ORAL | 0 refills | Status: DC | PRN

## 2020-05-30 MED ORDER — METHYLPREDNISOLONE ACETATE 80 MG/ML IJ SUSP
80.0000 mg | Freq: Once | INTRAMUSCULAR | Status: AC
  Administered 2020-05-30: 80 mg via INTRAMUSCULAR

## 2020-05-30 MED ORDER — PROMETHAZINE-DM 6.25-15 MG/5ML PO SYRP: 5.0000 mL | ORAL_SOLUTION | Freq: Every evening | ORAL | 0 refills | Status: DC | PRN

## 2020-05-30 MED ORDER — AZITHROMYCIN 250 MG PO TABS: ORAL_TABLET | ORAL | 0 refills | Status: DC

## 2020-05-30 MED FILL — AZITHROMYCIN 250 MG TABLET: 250 | 5 days supply | Qty: 6 | Fill #0

## 2020-05-30 MED FILL — PROMETHAZINE W/DM SYRUP: 6.25-15 | 20 days supply | Qty: 100 | Fill #0

## 2020-05-30 MED FILL — BENZONATATE 100 MG CAPS: 100 | 10 days supply | Qty: 60 | Fill #0

## 2020-05-30 NOTE — ED Triage Notes (Signed)
Patient states he has had a cough and congestion, as well as some congestion with a productive cough. Christena Deem states he feels better when he uses his inhaler as it feels like it makes the cough more productive and clears his chest. Pt is aox4 and ambulatory.

## 2020-05-30 NOTE — ED Provider Notes (Signed)
[place]   MRN: [id number] DOB: 04-26-1956  Subjective:   Zachary Jacobs is a 65 y.o. male presenting for 3 day history of recurrent chest congestion, shortness of breath, productive cough. Has a history of COPD, is using albuterol inhaler with temporary relief. Had a visit on 05/09/2020, was given IM Solumedrol 125mg  with good relief. Also used a steroid taper. Used to use Symbicort but hasn't been on this for a couple of years. He is a former smoker, quit in 1995.   No current facility-administered medications for this encounter.  Current Outpatient Medications:  .  albuterol (PROVENTIL HFA;VENTOLIN HFA) 108 (90 BASE) MCG/ACT inhaler, Inhale 2 puffs into the lungs every 6 (six) hours as needed for wheezing or shortness of breath., Disp: 1 Inhaler, Rfl: 6 .  albuterol (VENTOLIN HFA) 108 (90 Base) MCG/ACT inhaler, Inhale 2 puffs into the lungs every 4 (four) hours as needed for wheezing or shortness of breath., Disp: 1 each, Rfl: 0 .  tamsulosin (FLOMAX) 0.4 MG CAPS capsule, Take 0.4 mg by mouth every evening. , Disp: , Rfl:  .  APPLE CIDER VINEGAR PO, Take 1-2 tablets by mouth 4 (four) times a week. [place] Apple Cider Vinegar, Disp: , Rfl:  .  Cholecalciferol (VITAMIN D3) 50 MCG (2000 UT) TABS, Take 2,000 Units by mouth daily., Disp: , Rfl:  .  levocetirizine (XYZAL) 5 MG tablet, Take 5 mg by mouth at bedtime., Disp: , Rfl:  .  Olopatadine HCl 0.2 % SOLN, Apply 1 drop to eye daily., Disp: 2.5 mL, Rfl: 0 .  Oxycodone HCl 10 MG TABS, Take 1 tablet (10 mg total) by mouth every 6 (six) hours as needed., Disp: 12 tablet, Rfl: 0 .  Polyethyl Glycol-Propyl Glycol (LUBRICANT EYE DROPS) 0.4-0.3 % SOLN, Apply 1 drop to eye 3 (three) times daily as needed., Disp: 10 mL, Rfl: 0 .  predniSONE (DELTASONE) 10 MG tablet, Begin with 6 tabs on day 1, 5 tab on day 2, 4 tab on day 3, 3 tab on day 4, 2 tab on day 5, 1 tab on day 6-take with food, Disp: 21 tablet, Rfl: 0   No Known  Allergies  Past Medical History:  Diagnosis Date  . Allergy   . Anemia    as child  . COPD, mild ([place])    very mild  . History of kidney stones   . OSA (obstructive sleep apnea)    s/p  surgery 1996  . Pneumonia   . Pre-diabetes   . Renal calculus, left   . Renal cyst, right    SIMPLE  . Sleep apnea     cpap   dont wear latley     Past Surgical History:  Procedure Laterality Date  . CYSTO/  RIGHT URETEROSCOPY/  STENT PLACEMENT  10/ 2001  . CYSTOSCOPY/URETEROSCOPY/HOLMIUM LASER/STENT PLACEMENT Left 03/30/2015   Procedure: LEFT URETEROSCOPY/HOLMIUM LASER LITHOTRIPSY/STENT PLACEMENT, WITH  STONE BASKET EXTRACTION;  Surgeon: Kathie Rhodes, MD;  Location: [facility];  Service: Urology;  Laterality: Left;  . HOLMIUM LASER APPLICATION Left 42/08/9561   Procedure: HOLMIUM LASER APPLICATION;  Surgeon: Kathie Rhodes, MD;  Location: [facility];  Service: Urology;  Laterality: Left;  . IR URETERAL STENT LEFT NEW ACCESS W/O SEP NEPHROSTOMY CATH  05/17/2019  . NEPHROLITHOTOMY Left 03/20/2015   Procedure: NEPHROLITHOTOMY PERCUTANEOUS;  Surgeon: Kathie Rhodes, MD;  Location: [facility];  Service: Urology;  Laterality: Left;  . NEPHROLITHOTOMY Left 05/17/2019   Procedure: NEPHROLITHOTOMY PERCUTANEOUS;  Surgeon:  Kathie Rhodes, MD;  Location: [facility];  Service: Urology;  Laterality: Left;  . TONSILLECTOMY     as child  . UVULOPALATOPHARYNGOPLASTY  1996   nasal surgery    Family History  Problem Relation Age of Onset  . Cancer Mother   . Heart disease Father   . Colon polyps Father   . Cancer Other   . Obesity Other   . Colon cancer Neg Hx   . Esophageal cancer Neg Hx   . Rectal cancer Neg Hx   . Stomach cancer Neg Hx     Social History   Tobacco Use  . Smoking status: Former Smoker    Packs/day: 1.00    Years: 20.00    Pack years: 20.00    Types: Cigarettes    Quit date: 03/27/1984    Years since quitting: 36.2  . Smokeless tobacco: Never Used   Substance Use Topics  . Alcohol use: No  . Drug use: No    ROS   Objective:   Vitals: BP 137/87 (BP Location: Right Arm)   Pulse 67   Temp 98.3 F (36.8 C) (Oral)   Resp 20   SpO2 94%   Physical Exam Constitutional:      General: He is not in acute distress.    Appearance: Normal appearance. He is well-developed. He is not ill-appearing, toxic-appearing or diaphoretic.  HENT:     Head: Normocephalic and atraumatic.     Right Ear: External ear normal.     Left Ear: External ear normal.     Nose: Nose normal.     Mouth/Throat:     Mouth: Mucous membranes are moist.     Pharynx: Oropharynx is clear.  Eyes:     General: No scleral icterus.    Extraocular Movements: Extraocular movements intact.     Pupils: Pupils are equal, round, and reactive to light.  Cardiovascular:     Rate and Rhythm: Normal rate and regular rhythm.     Heart sounds: Normal heart sounds. No murmur heard. No friction rub. No gallop.   Pulmonary:     Effort: Pulmonary effort is normal. No respiratory distress.     Breath sounds: No stridor. Examination of the right-middle field reveals wheezing. Examination of the left-middle field reveals wheezing. Examination of the right-lower field reveals wheezing. Examination of the left-lower field reveals wheezing. Wheezing present. No rhonchi or rales.  Skin:    General: Skin is warm and dry.  Neurological:     Mental Status: He is alert and oriented to person, place, and time.  Psychiatric:        Mood and Affect: Mood normal.        Behavior: Behavior normal.        Thought Content: Thought content normal.     DG Chest 2 View  Result Date: 05/30/2020 CLINICAL DATA:  Cough.  Chest congestion. EXAM: CHEST - 2 VIEW COMPARISON:  05/09/2020. FINDINGS: Mediastinum hilar structures normal. Stable cardiomegaly. Mild pulmonary vascular prominence and interstitial prominence. Mild interstitial edema and/or pneumonitis cannot be excluded. Mild bibasilar  subsegmental atelectasis. No pleural effusion or pneumothorax. Degenerative change thoracic spine. IMPRESSION: Stable cardiomegaly. Mild pulmonary vascular prominence and interstitial prominence. Mild interstitial edema and/or pneumonitis cannot be excluded. Electronically Signed   By: Marcello Moores  Register   On: 05/30/2020 09:05    Assessment and Plan :   PDMP not reviewed this encounter.  1. Pneumonitis   2. Wheezing   3. Chronic obstructive pulmonary disease, unspecified  COPD type ([place])     Given pneumonitis will cover with azithromycin.  IM Depo-Medrol given in clinic.  Use supportive care otherwise.  COVID-19 testing pending. Counseled patient on potential for adverse effects with medications prescribed/recommended today, ER and return-to-clinic precautions discussed, patient verbalized understanding.    Jaynee Eagles, [place] 05/30/20 [phone]

## 2020-05-31 ENCOUNTER — Emergency Department (HOSPITAL_BASED_OUTPATIENT_CLINIC_OR_DEPARTMENT_OTHER)
Admission: EM | Admit: 2020-05-31 | Discharge: 2020-06-01 | Disposition: A | Discharge status: Home / Self Care | Payer: 59 | Attending: Emergency Medicine | Admitting: Emergency Medicine

## 2020-05-31 ENCOUNTER — Emergency Department (HOSPITAL_BASED_OUTPATIENT_CLINIC_OR_DEPARTMENT_OTHER): Payer: 59

## 2020-05-31 ENCOUNTER — Encounter (HOSPITAL_BASED_OUTPATIENT_CLINIC_OR_DEPARTMENT_OTHER): Payer: Self-pay | Admitting: *Deleted

## 2020-05-31 ENCOUNTER — Other Ambulatory Visit: Payer: Self-pay

## 2020-05-31 DIAGNOSIS — R079 Chest pain, unspecified: Secondary | ICD-10-CM | POA: Diagnosis not present

## 2020-05-31 DIAGNOSIS — J449 Chronic obstructive pulmonary disease, unspecified: Secondary | ICD-10-CM | POA: Diagnosis not present

## 2020-05-31 DIAGNOSIS — J441 Chronic obstructive pulmonary disease with (acute) exacerbation: Secondary | ICD-10-CM | POA: Diagnosis not present

## 2020-05-31 DIAGNOSIS — I447 Left bundle-branch block, unspecified: Secondary | ICD-10-CM | POA: Diagnosis not present

## 2020-05-31 DIAGNOSIS — Z20822 Contact with and (suspected) exposure to covid-19: Secondary | ICD-10-CM | POA: Diagnosis not present

## 2020-05-31 DIAGNOSIS — R0602 Shortness of breath: Secondary | ICD-10-CM | POA: Diagnosis not present

## 2020-05-31 DIAGNOSIS — R911 Solitary pulmonary nodule: Secondary | ICD-10-CM | POA: Diagnosis not present

## 2020-05-31 DIAGNOSIS — Z87891 Personal history of nicotine dependence: Secondary | ICD-10-CM | POA: Diagnosis not present

## 2020-05-31 DIAGNOSIS — R059 Cough, unspecified: Secondary | ICD-10-CM | POA: Diagnosis not present

## 2020-05-31 DIAGNOSIS — I517 Cardiomegaly: Secondary | ICD-10-CM | POA: Diagnosis not present

## 2020-05-31 LAB — CBC WITH DIFFERENTIAL/PLATELET
Abs Immature Granulocytes: 0.04 10*3/uL (ref 0.00–0.07)
Basophils Absolute: 0.1 10*3/uL (ref 0.0–0.1)
Basophils Relative: 1 %
Eosinophils Absolute: 0.4 10*3/uL (ref 0.0–0.5)
Eosinophils Relative: 4 %
HCT: 47.7 % (ref 39.0–52.0)
Hemoglobin: 16 g/dL (ref 13.0–17.0)
Immature Granulocytes: 0 %
Lymphocytes Relative: 21 %
Lymphs Abs: 2.3 10*3/uL (ref 0.7–4.0)
MCH: 31.1 pg (ref 26.0–34.0)
MCHC: 33.5 g/dL (ref 30.0–36.0)
MCV: 92.8 fL (ref 80.0–100.0)
Monocytes Absolute: 1 10*3/uL (ref 0.1–1.0)
Monocytes Relative: 9 %
Neutro Abs: 7.1 10*3/uL (ref 1.7–7.7)
Neutrophils Relative %: 65 %
Platelets: 302 10*3/uL (ref 150–400)
RBC: 5.14 MIL/uL (ref 4.22–5.81)
RDW: 12.5 % (ref 11.5–15.5)
WBC: 10.9 10*3/uL — ABNORMAL HIGH (ref 4.0–10.5)
nRBC: 0 % (ref 0.0–0.2)

## 2020-05-31 LAB — COMPREHENSIVE METABOLIC PANEL
ALT: 20 U/L (ref 0–44)
AST: 19 U/L (ref 15–41)
Albumin: 4.3 g/dL (ref 3.5–5.0)
Alkaline Phosphatase: 85 U/L (ref 38–126)
Anion gap: 10 (ref 5–15)
BUN: 11 mg/dL (ref 8–23)
CO2: 24 mmol/L (ref 22–32)
Calcium: 8.9 mg/dL (ref 8.9–10.3)
Chloride: 102 mmol/L (ref 98–111)
Creatinine, Ser: 1.04 mg/dL (ref 0.61–1.24)
GFR, Estimated: >60 mL/min (ref >60)
Glucose, Bld: 145 mg/dL — ABNORMAL HIGH (ref 70–99)
Potassium: 4.1 mmol/L (ref 3.5–5.1)
Sodium: 136 mmol/L (ref 135–145)
Total Bilirubin: 0.4 mg/dL (ref 0.3–1.2)
Total Protein: 8.6 g/dL — ABNORMAL HIGH (ref 6.5–8.1)

## 2020-05-31 LAB — SARS-COV-2, NAA 2 DAY TAT

## 2020-05-31 LAB — BRAIN NATRIURETIC PEPTIDE: B Natriuretic Peptide: 59 pg/mL (ref 0.0–100.0)

## 2020-05-31 LAB — TROPONIN I (HIGH SENSITIVITY): Troponin I (High Sensitivity): 4 ng/L (ref <18)

## 2020-05-31 LAB — NOVEL CORONAVIRUS, NAA: SARS-CoV-2, NAA: NOT DETECTED

## 2020-05-31 MED ORDER — IOHEXOL 350 MG/ML SOLN
100.0000 mL | Freq: Once | INTRAVENOUS | Status: AC | PRN
  Administered 2020-06-01: 100 mL via INTRAVENOUS

## 2020-05-31 MED ORDER — FUROSEMIDE 10 MG/ML IJ SOLN
40.0000 mg | Freq: Once | INTRAMUSCULAR | Status: AC
  Administered 2020-05-31: 40 mg via INTRAVENOUS
  Filled 2020-05-31: qty 4

## 2020-05-31 NOTE — ED Triage Notes (Signed)
Pt reports cough, congestion that started 5 days ago. Reports irregular HR that started today. Reports that he has not used his inhaler since yesterday morning. Reports some SOB. Pt has been on 4 rounds of steroids in the last 4 months for COPD. Pt ambulatory. A/O x 4

## 2020-06-01 ENCOUNTER — Other Ambulatory Visit (HOSPITAL_BASED_OUTPATIENT_CLINIC_OR_DEPARTMENT_OTHER): Payer: Self-pay | Admitting: Emergency Medicine

## 2020-06-01 DIAGNOSIS — R911 Solitary pulmonary nodule: Secondary | ICD-10-CM | POA: Diagnosis not present

## 2020-06-01 DIAGNOSIS — Z87891 Personal history of nicotine dependence: Secondary | ICD-10-CM | POA: Diagnosis not present

## 2020-06-01 DIAGNOSIS — Z20822 Contact with and (suspected) exposure to covid-19: Secondary | ICD-10-CM | POA: Diagnosis not present

## 2020-06-01 DIAGNOSIS — R079 Chest pain, unspecified: Secondary | ICD-10-CM | POA: Diagnosis not present

## 2020-06-01 DIAGNOSIS — J449 Chronic obstructive pulmonary disease, unspecified: Secondary | ICD-10-CM | POA: Diagnosis not present

## 2020-06-01 DIAGNOSIS — R0602 Shortness of breath: Secondary | ICD-10-CM | POA: Diagnosis not present

## 2020-06-01 LAB — SARS CORONAVIRUS 2 BY RT PCR (HOSPITAL ORDER, PERFORMED IN ~~LOC~~ HOSPITAL LAB): SARS Coronavirus 2: NEGATIVE

## 2020-06-01 LAB — TROPONIN I (HIGH SENSITIVITY): Troponin I (High Sensitivity): 4 ng/L (ref <18)

## 2020-06-01 MED ORDER — IPRATROPIUM BROMIDE 0.02 % IN SOLN
0.5000 mg | Freq: Once | RESPIRATORY_TRACT | Status: AC
  Administered 2020-06-01: 0.5 mg via RESPIRATORY_TRACT
  Filled 2020-06-01: qty 2.5

## 2020-06-01 MED ORDER — ALBUTEROL SULFATE (2.5 MG/3ML) 0.083% IN NEBU
5.0000 mg | INHALATION_SOLUTION | Freq: Once | RESPIRATORY_TRACT | Status: AC
  Administered 2020-06-01: 5 mg via RESPIRATORY_TRACT
  Filled 2020-06-01: qty 6

## 2020-06-01 MED ORDER — PREDNISONE 20 MG PO TABS: ORAL_TABLET | ORAL | 0 refills | Status: DC

## 2020-06-01 MED ORDER — LEVALBUTEROL TARTRATE 45 MCG/ACT IN AERO: 1.0000 | INHALATION_SPRAY | RESPIRATORY_TRACT | 0 refills | Status: DC | PRN

## 2020-06-01 MED ORDER — FUROSEMIDE 20 MG PO TABS: 20.0000 mg | ORAL_TABLET | Freq: Every day | ORAL | 0 refills | Status: DC

## 2020-06-01 MED ORDER — BUDESONIDE-FORMOTEROL FUMARATE 80-4.5 MCG/ACT IN AERO: 2.0000 | INHALATION_SPRAY | Freq: Two times a day (BID) | RESPIRATORY_TRACT | 0 refills | Status: DC

## 2020-06-01 MED FILL — SYMBICORT 80-4.5 MCG INH: 80-4.5 | 30 days supply | Qty: 10 | Fill #0

## 2020-06-01 MED FILL — LEVALBUTEROL TAR HFA 45MCG: 45 | 30 days supply | Qty: 15 | Fill #0

## 2020-06-01 MED FILL — predniSONE 20 MG TABS: 20 | 5 days supply | Qty: 11 | Fill #0

## 2020-06-01 MED FILL — FUROSEMIDE 20 MG TABS: 20 | 10 days supply | Qty: 10 | Fill #0

## 2020-06-01 NOTE — Progress Notes (Signed)
Walked patient on room air, patients Sp02 level 96-98%.

## 2020-06-01 NOTE — ED Provider Notes (Signed)
[facility] EMERGENCY DEPARTMENT Provider Note   CSN: [id number] Arrival date & time: 05/31/20  2300     History Chief Complaint  Patient presents with  . Irregular Heart Beat    Zachary Jacobs is a 65 y.o. male.  The history is provided by the patient and medical records.  Shortness of Breath Severity:  Moderate Onset quality:  Gradual Timing:  Intermittent Progression:  Worsening (worsening x 5 days ) Chronicity:  Recurrent (multiple episodes in the last several months seen x 2 at urgent care since the beginning of January) Context: not weather changes   Relieved by:  Nothing Exacerbated by: time  Ineffective treatments:  Inhaler and rest (intermittent steroids ) Associated symptoms: wheezing   Associated symptoms: no abdominal pain, no chest pain, no fever, no hemoptysis, no rash, no sore throat and no syncope   Risk factors: no recent alcohol use   Pleasant 65 YO gentleman with h/o mild COPD who presents with recurrent shortness of breath.  Seen at urgent care and started on Zpak for questioned pneumonitis.  No f/c/r.  No chest pain.  Patient reports trace pedal edema and fatigue.  2 negative covid tests this week. He is covid vaccinated x 3.        Past Medical History:  Diagnosis Date  . Allergy   . Anemia    as child  . COPD, mild ([place])    very mild  . History of kidney stones   . OSA (obstructive sleep apnea)    s/p  surgery 1996  . Pneumonia   . Pre-diabetes   . Renal calculus, left   . Renal cyst, right    SIMPLE  . Sleep apnea     cpap   dont wear latley    Patient Active Problem List   Diagnosis Date Noted  . Lower urinary tract symptoms (LUTS) 03/13/2017  . Acute UTI 03/13/2017  . History of kidney stones 03/13/2017  . Staghorn renal calculus 03/20/2015  . Hydronephrosis     Past Surgical History:  Procedure Laterality Date  . CYSTO/  RIGHT URETEROSCOPY/  STENT PLACEMENT  10/ 2001  . CYSTOSCOPY/URETEROSCOPY/HOLMIUM LASER/STENT  PLACEMENT Left 03/30/2015   Procedure: LEFT URETEROSCOPY/HOLMIUM LASER LITHOTRIPSY/STENT PLACEMENT, WITH  STONE BASKET EXTRACTION;  Surgeon: Kathie Rhodes, MD;  Location: [facility];  Service: Urology;  Laterality: Left;  . HOLMIUM LASER APPLICATION Left [id number]   Procedure: HOLMIUM LASER APPLICATION;  Surgeon: Kathie Rhodes, MD;  Location: [facility];  Service: Urology;  Laterality: Left;  . IR URETERAL STENT LEFT NEW ACCESS W/O SEP NEPHROSTOMY CATH  05/17/2019  . NEPHROLITHOTOMY Left 03/20/2015   Procedure: NEPHROLITHOTOMY PERCUTANEOUS;  Surgeon: Kathie Rhodes, MD;  Location: [facility];  Service: Urology;  Laterality: Left;  . NEPHROLITHOTOMY Left 05/17/2019   Procedure: NEPHROLITHOTOMY PERCUTANEOUS;  Surgeon: Kathie Rhodes, MD;  Location: [facility];  Service: Urology;  Laterality: Left;  . TONSILLECTOMY     as child  . UVULOPALATOPHARYNGOPLASTY  1996   nasal surgery       Family History  Problem Relation Age of Onset  . Cancer Mother   . Heart disease Father   . Colon polyps Father   . Cancer Other   . Obesity Other   . Colon cancer Neg Hx   . Esophageal cancer Neg Hx   . Rectal cancer Neg Hx   . Stomach cancer Neg Hx     Social History   Tobacco Use  . Smoking status:  Former Smoker    Packs/day: 1.00    Years: 20.00    Pack years: 20.00    Types: Cigarettes    Quit date: 03/27/1984    Years since quitting: 36.2  . Smokeless tobacco: Never Used  Substance Use Topics  . Alcohol use: No  . Drug use: No    Home Medications Prior to Admission medications   Medication Sig Start Date End Date Taking? Authorizing Provider  albuterol (PROVENTIL HFA;VENTOLIN HFA) 108 (90 BASE) MCG/ACT inhaler Inhale 2 puffs into the lungs every 6 (six) hours as needed for wheezing or shortness of breath. 07/29/14   Copland, Gay Filler, MD  albuterol (VENTOLIN HFA) 108 (90 Base) MCG/ACT inhaler Inhale 2 puffs into the lungs every 4 (four) hours as needed for wheezing or  shortness of breath. 04/12/20   Montine Circle, PA-C  APPLE CIDER VINEGAR PO Take 1-2 tablets by mouth 4 (four) times a week. [place] [employer] account executive, Historical, MD  azithromycin (ZITHROMAX) 250 MG tablet Start with 2 tablets today, then 1 daily thereafter. 05/30/20   Jaynee Eagles, PA-C  benzonatate (TESSALON) 100 MG capsule Take 1-2 capsules (100-200 mg total) by mouth 3 (three) times daily as needed for cough. 05/30/20   Jaynee Eagles, PA-C  Cholecalciferol (VITAMIN D3) 50 MCG (2000 UT) TABS Take 2,000 Units by mouth daily.    [provider]  levocetirizine (XYZAL) 5 MG tablet Take 5 mg by mouth at bedtime.    [provider]  Olopatadine HCl 0.2 % SOLN Apply 1 drop to eye daily. 08/07/19   Hall-Potvin, [place], PA-C  Oxycodone HCl 10 MG TABS Take 1 tablet (10 mg total) by mouth every 6 (six) hours as needed. 05/18/19   Kathie Rhodes, MD  Polyethyl Glycol-Propyl Glycol (LUBRICANT EYE DROPS) 0.4-0.3 % SOLN Apply 1 drop to eye 3 (three) times daily as needed. 08/07/19   Hall-Potvin, [place], PA-C  predniSONE (DELTASONE) 10 MG tablet Begin with 6 tabs on day 1, 5 tab on day 2, 4 tab on day 3, 3 tab on day 4, 2 tab on day 5, 1 tab on day 6-take with food 05/09/20   Wieters, [employer] C, PA-C  promethazine-dextromethorphan (PROMETHAZINE-DM) 6.25-15 MG/5ML syrup Take 5 mLs by mouth at bedtime as needed for cough. 05/30/20   Jaynee Eagles, PA-C  tamsulosin (FLOMAX) 0.4 MG CAPS capsule Take 0.4 mg by mouth every evening.     [provider]    Allergies    Patient has no known allergies.  Review of Systems   Review of Systems  Constitutional: Negative for fever.  HENT: Negative for sore throat.   Eyes: Negative for visual disturbance.  Respiratory: Positive for shortness of breath and wheezing. Negative for hemoptysis.   Cardiovascular: Negative for chest pain and syncope.       Trace pedal edema   Gastrointestinal: Negative for abdominal pain.  Genitourinary: Positive  for difficulty urinating. Negative for dysuria.       On Flomax   Musculoskeletal: Negative for arthralgias.  Skin: Negative for rash.  Neurological: Negative for facial asymmetry.  Psychiatric/Behavioral: Negative for agitation.  All other systems reviewed and are negative.   Physical Exam Updated Vital Signs BP 107/60   Pulse 63   Temp 98.1 F (36.7 C) (Oral)   Resp 14   Ht 5\' 10"  (1.778 m)   Wt 131.1 kg   SpO2 94%   BMI 41.47 kg/m   Physical Exam Vitals and nursing note reviewed.  Constitutional:      Appearance: Normal appearance. He is not diaphoretic.  HENT:     Head: Normocephalic and atraumatic.  Eyes:     Conjunctiva/sclera: Conjunctivae normal.     Pupils: Pupils are equal, round, and reactive to light.  Cardiovascular:     Rate and Rhythm: Normal rate and regular rhythm.     Pulses: Normal pulses.     Heart sounds: Normal heart sounds.  Pulmonary:     Effort: Tachypnea present.     Breath sounds: Decreased air movement present. No rhonchi.  Abdominal:     General: Abdomen is flat. Bowel sounds are normal.     Palpations: Abdomen is soft.     Tenderness: There is no abdominal tenderness. There is no guarding.  Musculoskeletal:        General: No tenderness or deformity. Normal range of motion.     Cervical back: Normal range of motion and neck supple.     Comments: trace pedal edema   Skin:    General: Skin is warm and dry.     Capillary Refill: Capillary refill takes less than 2 seconds.  Neurological:     General: No focal deficit present.     Mental Status: He is alert and oriented to person, place, and time.     Deep Tendon Reflexes: Reflexes normal.  Psychiatric:        Mood and Affect: Mood normal.        Behavior: Behavior normal.     ED Results / Procedures / Treatments   Labs (all labs ordered are listed, but only abnormal results are displayed) Results for orders placed or performed during the hospital encounter of 05/31/20  SARS  Coronavirus 2 by RT PCR (hospital order, performed in [facility] hospital lab) Nasopharyngeal Nasopharyngeal Swab   Specimen: Nasopharyngeal Swab  Result Value Ref Range   SARS Coronavirus 2 NEGATIVE NEGATIVE  CBC with Differential/Platelet  Result Value Ref Range   WBC 10.9 (H) 4.0 - 10.5 K/uL   RBC 5.14 4.22 - 5.81 MIL/uL   Hemoglobin 16.0 13.0 - 17.0 g/dL   HCT 47.7 39.0 - 52.0 %   MCV 92.8 80.0 - 100.0 fL   MCH 31.1 26.0 - 34.0 pg   MCHC 33.5 30.0 - 36.0 g/dL   RDW 12.5 11.5 - 15.5 %   Platelets 302 150 - 400 K/uL   nRBC 0.0 0.0 - 0.2 %   Neutrophils Relative % 65 %   Neutro Abs 7.1 1.7 - 7.7 K/uL   Lymphocytes Relative 21 %   Lymphs Abs 2.3 0.7 - 4.0 K/uL   Monocytes Relative 9 %   Monocytes Absolute 1.0 0.1 - 1.0 K/uL   Eosinophils Relative 4 %   Eosinophils Absolute 0.4 0.0 - 0.5 K/uL   Basophils Relative 1 %   Basophils Absolute 0.1 0.0 - 0.1 K/uL   Immature Granulocytes 0 %   Abs Immature Granulocytes 0.04 0.00 - 0.07 K/uL  Comprehensive metabolic panel  Result Value Ref Range   Sodium 136 135 - 145 mmol/L   Potassium 4.1 3.5 - 5.1 mmol/L   Chloride 102 98 - 111 mmol/L   CO2 24 22 - 32 mmol/L   Glucose, Bld 145 (H) 70 - 99 mg/dL   BUN 11 8 - 23 mg/dL   Creatinine, Ser 1.04 0.61 - 1.24 mg/dL   Calcium 8.9 8.9 - 10.3 mg/dL   Total Protein 8.6 (H) 6.5 - 8.1 g/dL   Albumin 4.3  3.5 - 5.0 g/dL   AST 19 15 - 41 U/L   ALT 20 0 - 44 U/L   Alkaline Phosphatase 85 38 - 126 U/L   Total Bilirubin 0.4 0.3 - 1.2 mg/dL   GFR, Estimated >60 >60 mL/min   Anion gap 10 5 - 15  Brain natriuretic peptide  Result Value Ref Range   B Natriuretic Peptide 59.0 0.0 - 100.0 pg/mL  Troponin I (High Sensitivity)  Result Value Ref Range   Troponin I (High Sensitivity) 4 <18 ng/L  Troponin I (High Sensitivity)  Result Value Ref Range   Troponin I (High Sensitivity) 4 <18 ng/L   DG Chest 2 View  Result Date: 05/30/2020 CLINICAL DATA:  Cough.  Chest congestion. EXAM: CHEST - 2 VIEW  COMPARISON:  05/09/2020. FINDINGS: Mediastinum hilar structures normal. Stable cardiomegaly. Mild pulmonary vascular prominence and interstitial prominence. Mild interstitial edema and/or pneumonitis cannot be excluded. Mild bibasilar subsegmental atelectasis. No pleural effusion or pneumothorax. Degenerative change thoracic spine. IMPRESSION: Stable cardiomegaly. Mild pulmonary vascular prominence and interstitial prominence. Mild interstitial edema and/or pneumonitis cannot be excluded. Electronically Signed   By: Marcello Moores  Register   On: 05/30/2020 09:05   DG Chest 2 View  Result Date: 05/09/2020 CLINICAL DATA:  Wheezing and rhonchi, more frequent exacerbations in past 1-2 months. EXAM: CHEST - 2 VIEW COMPARISON:  Sanna Porcaro 2, 2016 FINDINGS: Cardiomediastinal contours are stable. Question mild pulmonary vascular congestion with similar appearance to previous imaging. Areas of linear scarring at the RIGHT and LEFT lung base with similar appearance. No lobar consolidative changes or evidence of pleural effusion. On limited assessment no acute skeletal process. IMPRESSION: Basilar scarring and hyperinflation without change. Query mild central pulmonary vascular congestion. Electronically Signed   By: Zetta Bills M.D.   On: 05/09/2020 17:08   CT Angio Chest PE W and/or Wo Contrast  Result Date: 06/01/2020 CLINICAL DATA:  Chest pain and shortness of breath EXAM: CT ANGIOGRAPHY CHEST WITH CONTRAST TECHNIQUE: Multidetector CT imaging of the chest was performed using the standard protocol during bolus administration of intravenous contrast. Multiplanar CT image reconstructions and MIPs were obtained to evaluate the vascular anatomy. CONTRAST:  148mL OMNIPAQUE IOHEXOL 350 MG/ML SOLN COMPARISON:  Chest x-ray from the previous day. FINDINGS: Cardiovascular: Thoracic aorta is within normal limits. No cardiac enlargement is seen. Pulmonary artery shows a normal branching pattern. No filling defects to suggest pulmonary  embolism are noted. Coronary calcifications are noted. Mediastinum/Nodes: Thoracic inlet is within normal limits. No hilar or mediastinal adenopathy is noted. The esophagus as visualized is within normal limits. Lungs/Pleura: Lungs are well aerated bilaterally. Tiny less than 5 mm nodule is noted in the left upper lobe best seen on image number 22 of series 6. No other sizable nodules are seen. Mild scarring in the lingula is noted. Upper Abdomen: No acute abnormality. Musculoskeletal: Degenerative changes of the thoracic spine are noted. Review of the MIP images confirms the above findings. IMPRESSION: No pulmonary emboli are noted. Less than 5 mm nodule in the left upper lobe as described. No follow-up needed if patient is low-risk. Non-contrast chest CT can be considered in 12 months if patient is high-risk. This recommendation follows the consensus statement: Guidelines for Management of Incidental Pulmonary Nodules Detected on CT Images: From the Fleischner Society 2017; Radiology 2017; 284:228-243. Callus noted Electronically Signed   By: Inez Catalina M.D.   On: 06/01/2020 00:13   DG Chest Portable 1 View  Result Date: 05/31/2020 CLINICAL DATA:  Shortness of  breath.  Cough and congestion. EXAM: PORTABLE CHEST 1 VIEW COMPARISON:  Radiograph yesterday. FINDINGS: Upper normal heart size unchanged. Unchanged mediastinal contours. Limited basilar assessment on the current exam due to positioning and soft tissue attenuation from habitus. Vascular congestion without acute airspace disease. Lungs appear hyperinflated. No pneumothorax or large pleural effusion. No acute osseous abnormalities are seen. IMPRESSION: Stable borderline cardiomegaly and vascular congestion. Electronically Signed   By: Keith Rake M.D.   On: 05/31/2020 23:24    EKG EKG Interpretation  Date/Time:  Thursday May 31 2020 23:07:08 EST Ventricular Rate:  65 PR Interval:    QRS Duration: 118 QT Interval:  432 QTC  Calculation: 450 R Axis:   -64 Text Interpretation: Sinus rhythm Ventricular premature complex Incomplete left bundle branch block Confirmed by Randal Buba, Stevee Valenta ([id number]) on 05/31/2020 11:26:45 PM   Radiology DG Chest 2 View  Result Date: 05/30/2020 CLINICAL DATA:  Cough.  Chest congestion. EXAM: CHEST - 2 VIEW COMPARISON:  05/09/2020. FINDINGS: Mediastinum hilar structures normal. Stable cardiomegaly. Mild pulmonary vascular prominence and interstitial prominence. Mild interstitial edema and/or pneumonitis cannot be excluded. Mild bibasilar subsegmental atelectasis. No pleural effusion or pneumothorax. Degenerative change thoracic spine. IMPRESSION: Stable cardiomegaly. Mild pulmonary vascular prominence and interstitial prominence. Mild interstitial edema and/or pneumonitis cannot be excluded. Electronically Signed   By: Marcello Moores  Register   On: 05/30/2020 09:05   CT Angio Chest PE W and/or Wo Contrast  Result Date: 06/01/2020 CLINICAL DATA:  Chest pain and shortness of breath EXAM: CT ANGIOGRAPHY CHEST WITH CONTRAST TECHNIQUE: Multidetector CT imaging of the chest was performed using the standard protocol during bolus administration of intravenous contrast. Multiplanar CT image reconstructions and MIPs were obtained to evaluate the vascular anatomy. CONTRAST:  135mL OMNIPAQUE IOHEXOL 350 MG/ML SOLN COMPARISON:  Chest x-ray from the previous day. FINDINGS: Cardiovascular: Thoracic aorta is within normal limits. No cardiac enlargement is seen. Pulmonary artery shows a normal branching pattern. No filling defects to suggest pulmonary embolism are noted. Coronary calcifications are noted. Mediastinum/Nodes: Thoracic inlet is within normal limits. No hilar or mediastinal adenopathy is noted. The esophagus as visualized is within normal limits. Lungs/Pleura: Lungs are well aerated bilaterally. Tiny less than 5 mm nodule is noted in the left upper lobe best seen on image number 22 of series 6. No other sizable nodules  are seen. Mild scarring in the lingula is noted. Upper Abdomen: No acute abnormality. Musculoskeletal: Degenerative changes of the thoracic spine are noted. Review of the MIP images confirms the above findings. IMPRESSION: No pulmonary emboli are noted. Less than 5 mm nodule in the left upper lobe as described. No follow-up needed if patient is low-risk. Non-contrast chest CT can be considered in 12 months if patient is high-risk. This recommendation follows the consensus statement: Guidelines for Management of Incidental Pulmonary Nodules Detected on CT Images: From the Fleischner Society 2017; Radiology 2017; 284:228-243. Callus noted Electronically Signed   By: Inez Catalina M.D.   On: 06/01/2020 00:13   DG Chest Portable 1 View  Result Date: 05/31/2020 CLINICAL DATA:  Shortness of breath.  Cough and congestion. EXAM: PORTABLE CHEST 1 VIEW COMPARISON:  Radiograph yesterday. FINDINGS: Upper normal heart size unchanged. Unchanged mediastinal contours. Limited basilar assessment on the current exam due to positioning and soft tissue attenuation from habitus. Vascular congestion without acute airspace disease. Lungs appear hyperinflated. No pneumothorax or large pleural effusion. No acute osseous abnormalities are seen. IMPRESSION: Stable borderline cardiomegaly and vascular congestion. Electronically Signed   By: Threasa Beards  [place] M.D.   On: 05/31/2020 23:24    Procedures Procedures   Medications Ordered in ED Medications  furosemide (LASIX) injection 40 mg (40 mg Intravenous Given 05/31/20 2334)  iohexol (OMNIPAQUE) 350 MG/ML injection 100 mL (100 mLs Intravenous Contrast Given 06/01/20 0002)  albuterol (PROVENTIL) (2.5 MG/3ML) 0.083% nebulizer solution 5 mg (5 mg Nebulization Given 06/01/20 0036)  ipratropium (ATROVENT) nebulizer solution 0.5 mg (0.5 mg Nebulization Given 06/01/20 0036)    ED Course  I have reviewed the triage vital signs and the nursing notes.  Pertinent labs & imaging results that were  available during my care of the patient were reviewed by me and considered in my medical decision making (see chart for details).   I suspect this is worsening COPD with some degree of pulmonary hypertension putting the patient at risk for CHF.      Patient had questioned interstitial edema on Xray.  Lasix was given in the ED along with breathing treatments.  Patient ruled out for MI in the ED with 2 negative troponins and no ischemic changes on EKG.  HEART score is 3. Patient also ruled out for PE in the ED.  He will need a follow up chest CT in 12 months to assess stability of nodule seen on CT scan.  Given cardiomegaly, incomplete LBBB on EKG and ongoing symptoms I am referring the patient to cardiology for echocardiogram and further work up.  I will optimize outpatient management with steroids, long acting inhalers.    Zachary Jacobs was evaluated in Emergency Department on 06/01/2020 for the symptoms described in the history of present illness. He was evaluated in the context of the global COVID-19 pandemic, which necessitated consideration that the patient might be at risk for infection with the SARS-CoV-2 virus that causes COVID-19. Institutional protocols and algorithms that pertain to the evaluation of patients at risk for COVID-19 are in a state of rapid change based on information released by regulatory bodies including the CDC and federal and state organizations. These policies and algorithms were followed during the patient's care in the ED.   Final Clinical Impression(s) / ED Diagnoses Return for intractable cough, coughing up blood, fevers >100.4 unrelieved by medication, shortness of breath, intractable vomiting, chest pain, shortness of breath, weakness, numbness, changes in speech, facial asymmetry, abdominal pain, passing out, Inability to tolerate liquids or food, cough, altered mental status or any concerns. No signs of systemic illness or infection. The patient is  nontoxic-appearing on exam and vital signs are within normal limits.  I have reviewed the triage vital signs and the nursing notes. Pertinent labs & imaging results that were available during my care of the patient were reviewed by me and considered in my medical decision making (see chart for details). After history, exam, and medical workup I feel the patient has been appropriately medically screened and is safe for discharge home. Pertinent diagnoses were discussed with the patient. Patient was given return precautions.    Julieann Drummonds, MD 06/01/20 [id number]

## 2020-06-01 NOTE — Discharge Instructions (Signed)
You will need an outpatient chest CT scan in 12 months to assess stability of 5 mm nodule seen today.

## 2020-06-07 DIAGNOSIS — J4520 Mild intermittent asthma, uncomplicated: Secondary | ICD-10-CM | POA: Diagnosis not present

## 2020-06-07 DIAGNOSIS — R0602 Shortness of breath: Secondary | ICD-10-CM | POA: Diagnosis not present

## 2020-06-07 DIAGNOSIS — I10 Essential (primary) hypertension: Secondary | ICD-10-CM | POA: Diagnosis not present

## 2020-06-07 DIAGNOSIS — R072 Precordial pain: Secondary | ICD-10-CM | POA: Diagnosis not present

## 2020-06-11 DIAGNOSIS — R0602 Shortness of breath: Secondary | ICD-10-CM | POA: Diagnosis not present

## 2020-06-11 DIAGNOSIS — I425 Other restrictive cardiomyopathy: Secondary | ICD-10-CM | POA: Diagnosis not present

## 2020-06-11 DIAGNOSIS — R072 Precordial pain: Secondary | ICD-10-CM | POA: Diagnosis not present

## 2020-06-11 DIAGNOSIS — J4520 Mild intermittent asthma, uncomplicated: Secondary | ICD-10-CM | POA: Diagnosis not present

## 2020-06-12 ENCOUNTER — Other Ambulatory Visit: Payer: Self-pay

## 2020-06-12 ENCOUNTER — Other Ambulatory Visit: Payer: Self-pay | Admitting: Pulmonary Disease

## 2020-06-12 ENCOUNTER — Ambulatory Visit: Payer: 59 | Admitting: Pulmonary Disease

## 2020-06-12 ENCOUNTER — Encounter: Payer: Self-pay | Admitting: Pulmonary Disease

## 2020-06-12 VITALS — BP 122/74 | HR 58 | Temp 97.4°F | Ht 70.0 in | Wt 270.2 lb

## 2020-06-12 DIAGNOSIS — J4540 Moderate persistent asthma, uncomplicated: Secondary | ICD-10-CM | POA: Diagnosis not present

## 2020-06-12 DIAGNOSIS — IMO0001 Reserved for inherently not codable concepts without codable children: Secondary | ICD-10-CM

## 2020-06-12 DIAGNOSIS — R911 Solitary pulmonary nodule: Secondary | ICD-10-CM | POA: Diagnosis not present

## 2020-06-12 DIAGNOSIS — K219 Gastro-esophageal reflux disease without esophagitis: Secondary | ICD-10-CM | POA: Diagnosis not present

## 2020-06-12 MED ORDER — TRELEGY ELLIPTA 200-62.5-25 MCG/INH IN AEPB: 1.0000 | INHALATION_SPRAY | Freq: Every day | RESPIRATORY_TRACT | 11 refills | Status: DC

## 2020-06-12 MED ORDER — PANTOPRAZOLE SODIUM 40 MG PO TBEC: 40.0000 mg | DELAYED_RELEASE_TABLET | Freq: Every day | ORAL | 11 refills | Status: DC

## 2020-06-12 MED ORDER — TRELEGY ELLIPTA 200-62.5-25 MCG/INH IN AEPB: 1.0000 | INHALATION_SPRAY | Freq: Every day | RESPIRATORY_TRACT | 0 refills | Status: DC

## 2020-06-12 MED FILL — PANTOPRAZOLE SOD DR 40 MG T: 40 | 30 days supply | Qty: 30 | Fill #0

## 2020-06-12 NOTE — Patient Instructions (Addendum)
Nice to meet you  Somewhat about reactive airway disease or asthma new with the recurrent need for prednisone  Please use Trelegy 1 puff daily  Continue to use Xopenex as needed, feel free to send me a refill request for this medication  I will see you back in 3 months for follow-up with Dr. Silas Flood, sooner if needed  If in the future if things are not improving, we will need to consider breathing test, further evaluating the cough and shortness of breath after eating or drinking.
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
## 2020-06-13 DIAGNOSIS — I425 Other restrictive cardiomyopathy: Secondary | ICD-10-CM | POA: Diagnosis not present

## 2020-06-13 DIAGNOSIS — R0602 Shortness of breath: Secondary | ICD-10-CM | POA: Diagnosis not present

## 2020-06-13 DIAGNOSIS — J4520 Mild intermittent asthma, uncomplicated: Secondary | ICD-10-CM | POA: Diagnosis not present

## 2020-06-13 DIAGNOSIS — R072 Precordial pain: Secondary | ICD-10-CM | POA: Diagnosis not present

## 2020-06-14 ENCOUNTER — Other Ambulatory Visit (HOSPITAL_COMMUNITY)
Admission: RE | Admit: 2020-06-14 | Discharge: 2020-06-14 | Disposition: A | Discharge status: Home / Self Care | Payer: 59 | Source: Ambulatory Visit | Attending: Cardiovascular Disease | Admitting: Cardiovascular Disease

## 2020-06-14 DIAGNOSIS — Z01812 Encounter for preprocedural laboratory examination: Secondary | ICD-10-CM | POA: Insufficient documentation

## 2020-06-14 DIAGNOSIS — Z20822 Contact with and (suspected) exposure to covid-19: Secondary | ICD-10-CM | POA: Insufficient documentation

## 2020-06-14 LAB — SARS CORONAVIRUS 2 (TAT 6-24 HRS): SARS Coronavirus 2: NEGATIVE

## 2020-06-15 ENCOUNTER — Ambulatory Visit (HOSPITAL_COMMUNITY)
Admission: RE | Admit: 2020-06-15 | Discharge: 2020-06-15 | Disposition: A | Discharge status: Home / Self Care | Payer: 59 | Attending: Cardiovascular Disease | Admitting: Cardiovascular Disease

## 2020-06-15 ENCOUNTER — Encounter (HOSPITAL_COMMUNITY)
Admission: RE | Disposition: A | Discharge status: Home / Self Care | Payer: Self-pay | Source: Home / Self Care | Attending: Cardiovascular Disease

## 2020-06-15 ENCOUNTER — Other Ambulatory Visit (HOSPITAL_COMMUNITY): Payer: Self-pay | Admitting: Cardiovascular Disease

## 2020-06-15 ENCOUNTER — Other Ambulatory Visit: Payer: Self-pay

## 2020-06-15 DIAGNOSIS — Z7982 Long term (current) use of aspirin: Secondary | ICD-10-CM | POA: Diagnosis not present

## 2020-06-15 DIAGNOSIS — E669 Obesity, unspecified: Secondary | ICD-10-CM | POA: Insufficient documentation

## 2020-06-15 DIAGNOSIS — I200 Unstable angina: Secondary | ICD-10-CM | POA: Diagnosis not present

## 2020-06-15 DIAGNOSIS — R001 Bradycardia, unspecified: Secondary | ICD-10-CM | POA: Insufficient documentation

## 2020-06-15 DIAGNOSIS — I10 Essential (primary) hypertension: Secondary | ICD-10-CM | POA: Insufficient documentation

## 2020-06-15 DIAGNOSIS — E119 Type 2 diabetes mellitus without complications: Secondary | ICD-10-CM | POA: Diagnosis not present

## 2020-06-15 DIAGNOSIS — J449 Chronic obstructive pulmonary disease, unspecified: Secondary | ICD-10-CM | POA: Diagnosis not present

## 2020-06-15 DIAGNOSIS — Z6838 Body mass index (BMI) 38.0-38.9, adult: Secondary | ICD-10-CM | POA: Diagnosis not present

## 2020-06-15 DIAGNOSIS — R9431 Abnormal electrocardiogram [ECG] [EKG]: Secondary | ICD-10-CM | POA: Diagnosis present

## 2020-06-15 DIAGNOSIS — Z87891 Personal history of nicotine dependence: Secondary | ICD-10-CM | POA: Insufficient documentation

## 2020-06-15 DIAGNOSIS — E785 Hyperlipidemia, unspecified: Secondary | ICD-10-CM | POA: Diagnosis not present

## 2020-06-15 DIAGNOSIS — Z79899 Other long term (current) drug therapy: Secondary | ICD-10-CM | POA: Diagnosis not present

## 2020-06-15 HISTORY — PX: LEFT HEART CATH AND CORONARY ANGIOGRAPHY: CATH118249

## 2020-06-15 LAB — BASIC METABOLIC PANEL
Anion gap: 11 (ref 5–15)
BUN: 15 mg/dL (ref 8–23)
CO2: 21 mmol/L — ABNORMAL LOW (ref 22–32)
Calcium: 8.7 mg/dL — ABNORMAL LOW (ref 8.9–10.3)
Chloride: 106 mmol/L (ref 98–111)
Creatinine, Ser: 1.24 mg/dL (ref 0.61–1.24)
GFR, Estimated: >60 mL/min (ref >60)
Glucose, Bld: 90 mg/dL (ref 70–99)
Potassium: 4.1 mmol/L (ref 3.5–5.1)
Sodium: 138 mmol/L (ref 135–145)

## 2020-06-15 LAB — CBC
HCT: 46.8 % (ref 39.0–52.0)
Hemoglobin: 15.6 g/dL (ref 13.0–17.0)
MCH: 31.3 pg (ref 26.0–34.0)
MCHC: 33.3 g/dL (ref 30.0–36.0)
MCV: 94 fL (ref 80.0–100.0)
Platelets: 278 10*3/uL (ref 150–400)
RBC: 4.98 MIL/uL (ref 4.22–5.81)
RDW: 12.7 % (ref 11.5–15.5)
WBC: 11.8 10*3/uL — ABNORMAL HIGH (ref 4.0–10.5)
nRBC: 0 % (ref 0.0–0.2)

## 2020-06-15 SURGERY — LEFT HEART CATH AND CORONARY ANGIOGRAPHY
Anesthesia: LOCAL

## 2020-06-15 MED ORDER — MIDAZOLAM HCL 2 MG/2ML IJ SOLN
INTRAMUSCULAR | Status: AC
  Filled 2020-06-15: qty 2

## 2020-06-15 MED ORDER — LEVALBUTEROL TARTRATE 45 MCG/ACT IN AERO
1.0000 | INHALATION_SPRAY | RESPIRATORY_TRACT | Status: DC | PRN
Start: 2020-06-15 — End: 2020-06-15

## 2020-06-15 MED ORDER — LABETALOL HCL 5 MG/ML IV SOLN: 10.0000 mg | INTRAVENOUS | Status: DC | PRN

## 2020-06-15 MED ORDER — HEPARIN SODIUM (PORCINE) 1000 UNIT/ML IJ SOLN
INTRAMUSCULAR | Status: DC | PRN
  Administered 2020-06-15: 6000 [IU] via INTRAVENOUS

## 2020-06-15 MED ORDER — HYDRALAZINE HCL 20 MG/ML IJ SOLN: 10.0000 mg | INTRAMUSCULAR | Status: DC | PRN

## 2020-06-15 MED ORDER — FENTANYL CITRATE (PF) 100 MCG/2ML IJ SOLN
INTRAMUSCULAR | Status: DC | PRN
  Administered 2020-06-15: 25 ug via INTRAVENOUS

## 2020-06-15 MED ORDER — HEPARIN (PORCINE) IN NACL 1000-0.9 UT/500ML-% IV SOLN
INTRAVENOUS | Status: DC | PRN
  Administered 2020-06-15 (×2): 500 mL

## 2020-06-15 MED ORDER — SODIUM CHLORIDE 0.9 % IV SOLN: INTRAVENOUS | Status: AC

## 2020-06-15 MED ORDER — HEPARIN SODIUM (PORCINE) 1000 UNIT/ML IJ SOLN
INTRAMUSCULAR | Status: AC
  Filled 2020-06-15: qty 1

## 2020-06-15 MED ORDER — HEPARIN (PORCINE) IN NACL 1000-0.9 UT/500ML-% IV SOLN
INTRAVENOUS | Status: AC
  Filled 2020-06-15: qty 1000

## 2020-06-15 MED ORDER — LIDOCAINE HCL (PF) 1 % IJ SOLN
INTRAMUSCULAR | Status: AC
  Filled 2020-06-15: qty 30

## 2020-06-15 MED ORDER — SODIUM CHLORIDE 0.9 % WEIGHT BASED INFUSION: 1.0000 mL/kg/h | INTRAVENOUS | Status: DC

## 2020-06-15 MED ORDER — SODIUM CHLORIDE 0.9% FLUSH: 3.0000 mL | INTRAVENOUS | Status: DC | PRN

## 2020-06-15 MED ORDER — FENTANYL CITRATE (PF) 100 MCG/2ML IJ SOLN
INTRAMUSCULAR | Status: AC
  Filled 2020-06-15: qty 2

## 2020-06-15 MED ORDER — MIDAZOLAM HCL 2 MG/2ML IJ SOLN
INTRAMUSCULAR | Status: DC | PRN
  Administered 2020-06-15: 1 mg via INTRAVENOUS

## 2020-06-15 MED ORDER — LOSARTAN POTASSIUM 25 MG PO TABS: 25.0000 mg | ORAL_TABLET | Freq: Every day | ORAL | Status: DC

## 2020-06-15 MED ORDER — ASPIRIN 81 MG PO CHEW: 81.0000 mg | CHEWABLE_TABLET | ORAL | Status: DC

## 2020-06-15 MED ORDER — FLUTICASONE-UMECLIDIN-VILANT 200-62.5-25 MCG/INH IN AEPB: 1.0000 | INHALATION_SPRAY | Freq: Every day | RESPIRATORY_TRACT | Status: DC

## 2020-06-15 MED ORDER — SODIUM CHLORIDE 0.9 % IV SOLN: 250.0000 mL | INTRAVENOUS | Status: DC | PRN

## 2020-06-15 MED ORDER — VERAPAMIL HCL 2.5 MG/ML IV SOLN
INTRAVENOUS | Status: AC
  Filled 2020-06-15: qty 2

## 2020-06-15 MED ORDER — SODIUM CHLORIDE 0.9% FLUSH: 3.0000 mL | Freq: Two times a day (BID) | INTRAVENOUS | Status: DC

## 2020-06-15 MED ORDER — BENZONATATE 100 MG PO CAPS
100.0000 mg | ORAL_CAPSULE | Freq: Three times a day (TID) | ORAL | Status: DC | PRN
Start: 2020-06-15 — End: 2020-06-15

## 2020-06-15 MED ORDER — LIDOCAINE HCL (PF) 1 % IJ SOLN
INTRAMUSCULAR | Status: DC | PRN
  Administered 2020-06-15: 2 mL

## 2020-06-15 MED ORDER — LOSARTAN POTASSIUM 25 MG PO TABS: 25.0000 mg | ORAL_TABLET | Freq: Every day | ORAL | 3 refills | Status: DC

## 2020-06-15 MED ORDER — LEVOCETIRIZINE DIHYDROCHLORIDE 5 MG PO TABS: 5.0000 mg | ORAL_TABLET | Freq: Every evening | ORAL | Status: DC | PRN

## 2020-06-15 MED ORDER — VERAPAMIL HCL 2.5 MG/ML IV SOLN
INTRAVENOUS | Status: DC | PRN
  Administered 2020-06-15: 10 mL via INTRA_ARTERIAL

## 2020-06-15 MED ORDER — ONDANSETRON HCL 4 MG/2ML IJ SOLN: 4.0000 mg | Freq: Four times a day (QID) | INTRAMUSCULAR | Status: DC | PRN

## 2020-06-15 MED ORDER — TAMSULOSIN HCL 0.4 MG PO CAPS: 0.8000 mg | ORAL_CAPSULE | Freq: Every evening | ORAL | Status: DC

## 2020-06-15 MED ORDER — FLUTICASONE PROPIONATE 50 MCG/ACT NA SUSP: 1.0000 | Freq: Every day | NASAL | Status: DC | PRN

## 2020-06-15 MED ORDER — IOHEXOL 350 MG/ML SOLN
INTRAVENOUS | Status: DC | PRN
  Administered 2020-06-15: 55 mL

## 2020-06-15 MED ORDER — ACETAMINOPHEN 325 MG PO TABS: 650.0000 mg | ORAL_TABLET | ORAL | Status: DC | PRN

## 2020-06-15 MED FILL — LOSARTAN POTASSIUM 25 MG TA: 25 | 30 days supply | Qty: 30 | Fill #0

## 2020-06-15 SURGICAL SUPPLY — 8 items
CATH 5FR JL3.5 JR4 ANG PIG MP (CATHETERS) ×1 IMPLANT
DEVICE RAD COMP TR BAND LRG (VASCULAR PRODUCTS) ×1 IMPLANT
GLIDESHEATH SLEND SS 6F .021 (SHEATH) ×1 IMPLANT
GUIDEWIRE INQWIRE 1.5J.035X260 (WIRE) IMPLANT
INQWIRE 1.5J .035X260CM (WIRE) ×2
KIT HEART LEFT (KITS) ×2 IMPLANT
PACK CARDIAC CATHETERIZATION (CUSTOM PROCEDURE TRAY) ×2 IMPLANT
TRANSDUCER W/STOPCOCK (MISCELLANEOUS) ×2 IMPLANT

## 2020-06-15 NOTE — Discharge Instructions (Signed)
Radial Site Care  This sheet gives you information about how to care for yourself after your procedure. Your health care provider may also give you more specific instructions. If you have problems or questions, contact your health care provider. What can I expect after the procedure? After the procedure, it is common to have:  Bruising and tenderness at the catheter insertion area. Follow these instructions at home: Medicines  Take over-the-counter and prescription medicines only as told by your health care provider. Insertion site care  Follow instructions from your health care provider about how to take care of your insertion site. Make sure you: ? Wash your hands with soap and water before you change your bandage (dressing). If soap and water are not available, use hand sanitizer. ? Change your dressing as told by your health care provider. ? Leave stitches (sutures), skin glue, or adhesive strips in place. These skin closures may need to stay in place for 2 weeks or longer. If adhesive strip edges start to loosen and curl up, you may trim the loose edges. Do not remove adhesive strips completely unless your health care provider tells you to do that.  Check your insertion site every day for signs of infection. Check for: ? Redness, swelling, or pain. ? Fluid or blood. ? Pus or a bad smell. ? Warmth.  Do not take baths, swim, or use a hot tub until your health care provider approves.  You may shower 24-48 hours after the procedure, or as directed by your health care provider. ? Remove the dressing and gently wash the site with plain soap and water. ? Pat the area dry with a clean towel. ? Do not rub the site. That could cause bleeding.  Do not apply powder or lotion to the site. Activity  For 24 hours after the procedure, or as directed by your health care provider: ? Do not flex or bend the affected arm. ? Do not push or pull heavy objects with the affected arm. ? Do not drive  yourself home from the hospital or clinic. You may drive 24 hours after the procedure unless your health care provider tells you not to. ? Do not operate machinery or power tools.  Do not lift anything that is heavier than 10 lb (4.5 kg), or the limit that you are told, until your health care provider says that it is safe.  Ask your health care provider when it is okay to: ? Return to work or school. ? Resume usual physical activities or sports. ? Resume sexual activity.   General instructions  If the catheter site starts to bleed, raise your arm and put firm pressure on the site. If the bleeding does not stop, get help right away. This is a medical emergency.  If you went home on the same day as your procedure, a responsible adult should be with you for the first 24 hours after you arrive home.  Keep all follow-up visits as told by your health care provider. This is important. Contact a health care provider if:  You have a fever.  You have redness, swelling, or yellow drainage around your insertion site. Get help right away if:  You have unusual pain at the radial site.  The catheter insertion area swells very fast.  The insertion area is bleeding, and the bleeding does not stop when you hold steady pressure on the area.  Your arm or hand becomes pale, cool, tingly, or numb. These symptoms may represent a serious   problem that is an emergency. Do not wait to see if the symptoms will go away. Get medical help right away. Call your local emergency services (911 in the [place] Do not drive yourself to the hospital. Summary  After the procedure, it is common to have bruising and tenderness at the site.  Follow instructions from your health care provider about how to take care of your radial site wound. Check the wound every day for signs of infection.  Do not lift anything that is heavier than 10 lb (4.5 kg), or the limit that you are told, until your health care provider says that it  is safe. This information is not intended to replace advice given to you by your health care provider. Make sure you discuss any questions you have with your health care provider. Document Revised: 05/20/2017 Document Reviewed: 05/20/2017 Elsevier Patient Education  2021 Elsevier Inc.  

## 2020-06-15 NOTE — H&P (Signed)
Referring Physician: April K Palumbo  Zachary Jacobs is an 65 y.o. male.                       Chief Complaint: Shortness of breath and leg edema  HPI: 65 years old white male with PMH of diet controlled diabetes, hypertension, remote h/o smoking, hyperlipidemia and obesity has exertional shortness of breath and abnormal stress test with 10 % probability of mortality. His leg edema improved with furosemide use. EKG showed sinus rhythm with LBBB. Echocardiogram showed mild LV systolic dysfunction. LV EF 40-45 %.  Past Medical History:  Diagnosis Date  . Allergy   . Anemia    as child  . COPD, mild ([place])    very mild  . History of kidney stones   . OSA (obstructive sleep apnea)    s/p  surgery 1996  . Pneumonia   . Pre-diabetes   . Renal calculus, left   . Renal cyst, right    SIMPLE  . Sleep apnea     cpap   dont wear latley      Past Surgical History:  Procedure Laterality Date  . CYSTO/  RIGHT URETEROSCOPY/  STENT PLACEMENT  10/ 2001  . CYSTOSCOPY/URETEROSCOPY/HOLMIUM LASER/STENT PLACEMENT Left 03/30/2015   Procedure: LEFT URETEROSCOPY/HOLMIUM LASER LITHOTRIPSY/STENT PLACEMENT, WITH  STONE BASKET EXTRACTION;  Surgeon: Kathie Rhodes, MD;  Location: [facility];  Service: Urology;  Laterality: Left;  . HOLMIUM LASER APPLICATION Left 62/09/9483   Procedure: HOLMIUM LASER APPLICATION;  Surgeon: Kathie Rhodes, MD;  Location: [facility];  Service: Urology;  Laterality: Left;  . IR URETERAL STENT LEFT NEW ACCESS W/O SEP NEPHROSTOMY CATH  05/17/2019  . NEPHROLITHOTOMY Left 03/20/2015   Procedure: NEPHROLITHOTOMY PERCUTANEOUS;  Surgeon: Kathie Rhodes, MD;  Location: [facility];  Service: Urology;  Laterality: Left;  . NEPHROLITHOTOMY Left 05/17/2019   Procedure: NEPHROLITHOTOMY PERCUTANEOUS;  Surgeon: Kathie Rhodes, MD;  Location: [facility];  Service: Urology;  Laterality: Left;  . TONSILLECTOMY     as child  . UVULOPALATOPHARYNGOPLASTY  1996   nasal surgery     Family History  Problem Relation Age of Onset  . Cancer Mother   . Heart disease Father   . Colon polyps Father   . Cancer Other   . Obesity Other   . Colon cancer Neg Hx   . Esophageal cancer Neg Hx   . Rectal cancer Neg Hx   . Stomach cancer Neg Hx    Social History:  reports that he quit smoking about 26 years ago. His smoking use included cigarettes. He has a 20.00 pack-year smoking history. He has never used smokeless tobacco. He reports that he does not drink alcohol and does not use drugs.  Allergies: No Known Allergies  Medications Prior to Admission  Medication Sig Dispense Refill  . aspirin EC 81 MG tablet Take 81 mg by mouth daily. Swallow whole.    . benzonatate (TESSALON) 100 MG capsule Take 1-2 capsules (100-200 mg total) by mouth 3 (three) times daily as needed for cough. 60 capsule 0  . famotidine (PEPCID) 10 MG tablet Take 10 mg by mouth daily as needed for heartburn or indigestion.    . fluticasone (FLONASE) 50 MCG/ACT nasal spray Place 1 spray into both nostrils daily as needed for allergies or rhinitis.    . Fluticasone-Umeclidin-Vilant (TRELEGY ELLIPTA) 200-62.5-25 MCG/INH AEPB Inhale 1 puff into the lungs daily. 2 each 0  . levalbuterol (XOPENEX HFA) 45 MCG/ACT  inhaler Inhale 1-2 puffs into the lungs every 4 (four) hours as needed for wheezing. 1 each 0  . levocetirizine (XYZAL) 5 MG tablet Take 5 mg by mouth at bedtime as needed for allergies.    . tamsulosin (FLOMAX) 0.4 MG CAPS capsule Take 0.8 mg by mouth every evening.    . Tetrahydrozoline HCl (VISINE OP) Place 1 drop into both eyes daily as needed (allergies).    . nitroGLYCERIN (NITROSTAT) 0.4 MG SL tablet Place 0.4 mg under the tongue every 5 (five) minutes as needed for chest pain.    . pantoprazole (PROTONIX) 40 MG tablet Take 1 tablet (40 mg total) by mouth daily. 30 tablet 11    Results for orders placed or performed during the hospital encounter of 06/14/20 (from the past 48 hour(s))  SARS  CORONAVIRUS 2 (TAT 6-24 HRS) Nasopharyngeal Nasopharyngeal Swab     Status: None   Collection Time: 06/14/20 10:49 AM   Specimen: Nasopharyngeal Swab  Result Value Ref Range   SARS Coronavirus 2 NEGATIVE NEGATIVE    Comment: (NOTE) SARS-CoV-2 target nucleic acids are NOT DETECTED.  The SARS-CoV-2 RNA is generally detectable in upper and lower respiratory specimens during the acute phase of infection. Negative results do not preclude SARS-CoV-2 infection, do not rule out co-infections with other pathogens, and should not be used as the sole basis for treatment or other patient management decisions. Negative results must be combined with clinical observations, patient history, and epidemiological information. The expected result is Negative.  Fact Sheet for Patients: SugarRoll.be  Fact Sheet for Healthcare Providers: https://www.woods-mathews.com/  This test is not yet approved or cleared by the [place] FDA and  has been authorized for detection and/or diagnosis of SARS-CoV-2 by FDA under an Emergency Use Authorization (EUA). This EUA will remain  in effect (meaning this test can be used) for the duration of the COVID-19 declaration under Se ction 564(b)(1) of the Act, 21 U.S.C. section 360bbb-3(b)(1), unless the authorization is terminated or revoked sooner.  Performed at [facility], [place] [address], [place], [place] [address]    No results found.  Review Of Systems Constitutional: No fever, chills, chronic weight gain. Eyes: No vision change, wears glasses. No discharge or pain. Ears: No hearing loss, No tinnitus. Respiratory: Positive asthma, COPD, pneumonias. Positive shortness of breath. No hemoptysis. Cardiovascular: Positive chest pain, palpitation, leg edema. Gastrointestinal: No nausea, vomiting, diarrhea, constipation. No GI bleed. No hepatitis. Genitourinary: No dysuria, hematuria, kidney stone. No  incontinance. Neurological: No headache, stroke, seizures.  Psychiatry: No psych facility admission for anxiety, depression, suicide. No detox. Skin: No rash. Musculoskeletal: Positive joint pain, no fibromyalgia. Positive neck pain, back pain. Lymphadenopathy: No lymphadenopathy. Hematology: No anemia or easy bruising.   There were no vitals taken for this visit. There is no height or weight on file to calculate BMI. General appearance: alert, cooperative, appears stated age and mild respiratory distress Head: Normocephalic, atraumatic. Eyes: Hazel eyes, pink conjunctiva, corneas clear.  Neck: No adenopathy, no carotid bruit, no JVD, supple, symmetrical, trachea midline and thyroid not enlarged. Resp: Clearing to auscultation bilaterally. Cardio: Regular rate and rhythm, S1, S2 normal, II/VI systolic murmur, no click, rub or gallop GI: Soft, non-tender; bowel sounds normal; no organomegaly. Extremities: No edema, cyanosis or clubbing. Skin: Warm and dry.  Neurologic: Alert and oriented X 3, normal strength. Normal coordination and slow gait.  Assessment/Plan Unstable angina HTN Type 2 DM Obesity Hyperlipidemia Asthma H/O smoking  Cardiac cath with possible intervention. Patient  understood procedure and risks.  Time spent: Review of old records, Lab, x-rays, EKG, other cardiac tests, examination, discussion with patient/Nurse over 70 minutes.  Birdie Riddle, MD  06/15/2020, 7:46 AM

## 2020-06-15 NOTE — Progress Notes (Signed)
Client c/o vision changes. States has line on periphery of left eye and squiggles right eye; Dr Doylene Canard notified and in to see client; no new orders noted

## 2020-06-15 NOTE — Progress Notes (Signed)
Client states vision changes have resolved

## 2020-06-18 ENCOUNTER — Encounter (HOSPITAL_COMMUNITY): Payer: Self-pay | Admitting: Cardiovascular Disease

## 2020-06-21 ENCOUNTER — Telehealth: Payer: Self-pay | Admitting: Internal Medicine

## 2020-06-21 NOTE — Telephone Encounter (Signed)
Patient came in and dropped off FMLA paperwork. Paperwork will be put in PCP folder.

## 2020-06-25 NOTE — Telephone Encounter (Signed)
Dr. Silas Flood,  This message was received today.  Hello Dr. Silas Flood Wanted to give you an update on how I am and then have a question after.  I had an echocardiogram (06/11/2020 and stress test (06/13/20) with Dr. Cydney Ok.  The Echocardiogram showed bradycardia at rest and some ejection fraction between 43 and 53 %.  I was only able to do three minutes due to SOB and chest pressure.  Two days later I had a left heart cath (06/15/2020) and coronary angiography that showed minor blockages (10% to 20%).  Everyone said that was good news and I was glad but puzzled about the stress test.  Dr Doylene Canard seemed to think it was more a lung issue than cardiac but did prescribe Losartan for my heart.  I had only been on the Trelegy and Protonix the day of the stress test.  I have walked a mile every morning for the past six days and I have had no chest pressure or SOB.  The Trelegy is working well, and I have had only mild reflux.  The pharmacist told me that it could take up to a month for the protonix to work fully.  I feel good and have maintained my strict diet.    I am near the limit of what I can type in this email.  Will follow up with my question in another email.  Message routed to Dr. Silas Flood

## 2020-06-25 NOTE — Telephone Encounter (Signed)
Message routed to Dr. Silas Flood to advise  Hello Dr.  Silas Flood I had an appointment with my new primary care provider Dr Phill Myron 06/26/2020.  Her office called and said Dr Juleen China had an emergency and they rescheduled for 06/27/2020.  Initially I thought Dr Doylene Canard was doing my FMLA paperwork, but his office said they I should give that to my Primary Care Provider. and I did so.  In the unlikely event she is unable to keep the appointment of the 2nd would you be able to write a note for me to return to work?  I understand if you cannot.  Thank you.   Zachary Jacobs

## 2020-06-26 ENCOUNTER — Telehealth: Payer: 59 | Admitting: Internal Medicine

## 2020-06-26 NOTE — Telephone Encounter (Signed)
Received an additional message from patient:   "Thank you. Dr. Alcario Drought office just called.  They told me she would be out of the office until further notice.  I need a note to return to work.  But now I am once again without a Primary Care Provider, and I had dropped off FMLA forms for her to fill out.  Dr Doylene Canard office told me they do not fill out FMLA forms.  Would your office be able to complete the forms?  If you can I can drop the forms off.  Thanks. Zachary Jacobs"   MH, please advise if you are willing to take over his FMLA forms.

## 2020-06-27 ENCOUNTER — Telehealth: Payer: 59 | Admitting: Internal Medicine

## 2020-06-27 NOTE — Telephone Encounter (Signed)
Dr. Silas Flood Please advise on the note for him to return to work.  Thanks.

## 2020-06-28 DIAGNOSIS — Z0289 Encounter for other administrative examinations: Secondary | ICD-10-CM

## 2020-06-29 ENCOUNTER — Other Ambulatory Visit: Payer: Self-pay

## 2020-06-29 ENCOUNTER — Ambulatory Visit
Admission: EM | Admit: 2020-06-29 | Discharge: 2020-06-29 | Disposition: A | Discharge status: Home / Self Care | Payer: 59 | Attending: Urgent Care | Admitting: Urgent Care

## 2020-06-29 ENCOUNTER — Telehealth: Payer: 59 | Admitting: Emergency Medicine

## 2020-06-29 DIAGNOSIS — N3001 Acute cystitis with hematuria: Secondary | ICD-10-CM | POA: Diagnosis not present

## 2020-06-29 DIAGNOSIS — R300 Dysuria: Secondary | ICD-10-CM

## 2020-06-29 DIAGNOSIS — Z87442 Personal history of urinary calculi: Secondary | ICD-10-CM | POA: Diagnosis not present

## 2020-06-29 LAB — POCT URINALYSIS DIP (MANUAL ENTRY)
Bilirubin, UA: NEGATIVE
Glucose, UA: NEGATIVE mg/dL
Ketones, POC UA: NEGATIVE mg/dL
Nitrite, UA: NEGATIVE
Spec Grav, UA: 1.01 (ref 1.010–1.025)
Urobilinogen, UA: 0.2 E.U./dL
pH, UA: 7.5 (ref 5.0–8.0)

## 2020-06-29 MED ORDER — CEPHALEXIN 500 MG PO CAPS: 500.0000 mg | ORAL_CAPSULE | Freq: Two times a day (BID) | ORAL | 0 refills | Status: DC

## 2020-06-29 NOTE — Progress Notes (Signed)
Based on what you shared with me, I feel your condition warrants further evaluation and I recommend that you be seen for a face to face office visit.  Male bladder infections are not very common.  We worry about prostate or kidney conditions.  The standard of care is to examine the abdomen and kidneys, and to do a urine and blood test to make sure that something more serious is not going on.  We recommend that you see a provider today.  If your doctor's office is closed  has the following Urgent Cares:    NOTE: If you entered your credit card information for this eVisit, you will not be charged. You may see a "hold" on your card for the $35 but that hold will drop off and you will not have a charge processed.   If you are having a true medical emergency please call 911.     For an urgent face to face visit, [facility] has four urgent care centers for your convenience:    NEW:  [facility] [place] [address] [place], [place] [address] .  Monday - Friday 10 am - 6 pm    . [facility]    [phone]                  Get Driving Directions  [address] [place], [place] [address] . 10 am to 8 pm Monday-Friday . 12 pm to 8 pm Saturday-Sunday   . [facility] at [place]                  Get Driving Directions  [address], [place], [place] [address] . 8 am to 8 pm Monday-Friday . 9 am to 6 pm Saturday . 11 am to 6 pm Sunday   . [facility] at [place]                  Get Driving Directions   [address], [place] [address] . 8 am to 8 pm Monday-Friday . 8 am to 4 pm Saturday-Sunday    . [facility] at [place]                    Get Driving Directions  [phone]  [address], [place], [place] [address]  . Monday-Friday, 12 PM to 6 PM    Your e-visit  answers were reviewed by a board certified advanced clinical practitioner to complete your personal care plan.  Thank you for using e-Visits.  Approximately 5 minutes was used in reviewing the patient's chart, questionnaire, prescribing medications, and documentation.

## 2020-06-29 NOTE — ED Triage Notes (Signed)
Pt c/o dirty urine x1wk and started having burning on urination x2 days. States had a cardiac cath a week and a half ago.

## 2020-06-29 NOTE — ED Provider Notes (Signed)
[place]   MRN: [id number] DOB: Nov 25, 1955  Subjective:   BIRNEY BELSHE is a 65 y.o. male with pmh of renal calculus, UTIs, hydronephrosis presenting for 1 week history of dark malodorous urine that progressed to dysuria in the past 2 days.  Patient has tried to hydrate very well.  Denies fever, nausea, vomiting, abdominal or flank pain.  He does have a urologist and has a follow-up coming up soon.  Tries to hydrate very well.  Drinks only water.  No current facility-administered medications for this encounter.  Current Outpatient Medications:  .  aspirin EC 81 MG tablet, Take 81 mg by mouth daily. Swallow whole., Disp: , Rfl:  .  benzonatate (TESSALON) 100 MG capsule, Take 1-2 capsules (100-200 mg total) by mouth 3 (three) times daily as needed for cough., Disp: 60 capsule, Rfl: 0 .  famotidine (PEPCID) 10 MG tablet, Take 10 mg by mouth daily as needed for heartburn or indigestion., Disp: , Rfl:  .  fluticasone (FLONASE) 50 MCG/ACT nasal spray, Place 1 spray into both nostrils daily as needed for allergies or rhinitis., Disp: , Rfl:  .  Fluticasone-Umeclidin-Vilant (TRELEGY ELLIPTA) 200-62.5-25 MCG/INH AEPB, Inhale 1 puff into the lungs daily., Disp: 2 each, Rfl: 0 .  levalbuterol (XOPENEX HFA) 45 MCG/ACT inhaler, Inhale 1-2 puffs into the lungs every 4 (four) hours as needed for wheezing., Disp: 1 each, Rfl: 0 .  levocetirizine (XYZAL) 5 MG tablet, Take 5 mg by mouth at bedtime as needed for allergies., Disp: , Rfl:  .  losartan (COZAAR) 25 MG tablet, Take 1 tablet (25 mg total) by mouth daily., Disp: 30 tablet, Rfl: 3 .  nitroGLYCERIN (NITROSTAT) 0.4 MG SL tablet, Place 0.4 mg under the tongue every 5 (five) minutes as needed for chest pain., Disp: , Rfl:  .  pantoprazole (PROTONIX) 40 MG tablet, Take 1 tablet (40 mg total) by mouth daily., Disp: 30 tablet, Rfl: 11 .  tamsulosin (FLOMAX) 0.4 MG CAPS capsule, Take 0.8 mg by mouth every evening., Disp: , Rfl:  .   Tetrahydrozoline HCl (VISINE OP), Place 1 drop into both eyes daily as needed (allergies)., Disp: , Rfl:    No Known Allergies  Past Medical History:  Diagnosis Date  . Allergy   . Anemia    as child  . COPD, mild ([place])    very mild  . History of kidney stones   . OSA (obstructive sleep apnea)    s/p  surgery 1996  . Pneumonia   . Pre-diabetes   . Renal calculus, left   . Renal cyst, right    SIMPLE  . Sleep apnea     cpap   dont wear latley     Past Surgical History:  Procedure Laterality Date  . CYSTO/  RIGHT URETEROSCOPY/  STENT PLACEMENT  10/ 2001  . CYSTOSCOPY/URETEROSCOPY/HOLMIUM LASER/STENT PLACEMENT Left 03/30/2015   Procedure: LEFT URETEROSCOPY/HOLMIUM LASER LITHOTRIPSY/STENT PLACEMENT, WITH  STONE BASKET EXTRACTION;  Surgeon: Kathie Rhodes, MD;  Location: [facility];  Service: Urology;  Laterality: Left;  . HOLMIUM LASER APPLICATION Left 78/07/6960   Procedure: HOLMIUM LASER APPLICATION;  Surgeon: Kathie Rhodes, MD;  Location: [facility];  Service: Urology;  Laterality: Left;  . IR URETERAL STENT LEFT NEW ACCESS W/O SEP NEPHROSTOMY CATH  05/17/2019  . LEFT HEART CATH AND CORONARY ANGIOGRAPHY N/A 06/15/2020   Procedure: LEFT HEART CATH AND CORONARY ANGIOGRAPHY;  Surgeon: Dixie Dials, MD;  Location: [facility];  Service: Cardiovascular;  Laterality: N/A;  . NEPHROLITHOTOMY Left 03/20/2015   Procedure: NEPHROLITHOTOMY PERCUTANEOUS;  Surgeon: Kathie Rhodes, MD;  Location: [facility];  Service: Urology;  Laterality: Left;  . NEPHROLITHOTOMY Left 05/17/2019   Procedure: NEPHROLITHOTOMY PERCUTANEOUS;  Surgeon: Kathie Rhodes, MD;  Location: [facility];  Service: Urology;  Laterality: Left;  . TONSILLECTOMY     as child  . UVULOPALATOPHARYNGOPLASTY  1996   nasal surgery    Family History  Problem Relation Age of Onset  . Cancer Mother   . Heart disease Father   . Colon polyps Father   . Cancer Other   . Obesity Other   . Colon cancer Neg Hx    . Esophageal cancer Neg Hx   . Rectal cancer Neg Hx   . Stomach cancer Neg Hx     Social History   Tobacco Use  . Smoking status: Former Smoker    Packs/day: 1.00    Years: 20.00    Pack years: 20.00    Types: Cigarettes    Quit date: 03/27/1984    Years since quitting: 36.2  . Smokeless tobacco: Never Used  Substance Use Topics  . Alcohol use: No  . Drug use: No    ROS   Objective:   Vitals: BP 123/79 (BP Location: Left Arm)   Pulse 64   Temp 98.1 F (36.7 C) (Oral)   Resp 18   SpO2 96%   Physical Exam Constitutional:      General: He is not in acute distress.    Appearance: Normal appearance. He is well-developed. He is obese. He is not ill-appearing, toxic-appearing or diaphoretic.  HENT:     Head: Normocephalic and atraumatic.     Right Ear: External ear normal.     Left Ear: External ear normal.     Nose: Nose normal.     Mouth/Throat:     Pharynx: Oropharynx is clear.  Eyes:     General: No scleral icterus.       Right eye: No discharge.        Left eye: No discharge.     Extraocular Movements: Extraocular movements intact.     Pupils: Pupils are equal, round, and reactive to light.  Cardiovascular:     Rate and Rhythm: Normal rate.  Pulmonary:     Effort: Pulmonary effort is normal.  Abdominal:     Tenderness: There is no right CVA tenderness or left CVA tenderness.  Musculoskeletal:     Cervical back: Normal range of motion.  Neurological:     Mental Status: He is alert and oriented to person, place, and time.  Psychiatric:        Mood and Affect: Mood normal.        Behavior: Behavior normal.        Thought Content: Thought content normal.        Judgment: Judgment normal.     Results for orders placed or performed during the hospital encounter of 06/29/20 (from the past 24 hour(s))  POCT urinalysis dipstick     Status: Abnormal   Collection Time: 06/29/20  4:25 PM  Result Value Ref Range   Color, UA yellow yellow   Clarity, UA  cloudy (A) clear   Glucose, UA negative negative mg/dL   Bilirubin, UA negative negative   Ketones, POC UA negative negative mg/dL   Spec Grav, UA 1.010 1.010 - 1.025   Blood, UA small (A) negative   pH, UA 7.5 5.0 - 8.0   Protein  Ur, POC trace (A) negative mg/dL   Urobilinogen, UA 0.2 0.2 or 1.0 E.U./dL   Nitrite, UA Negative Negative   Leukocytes, UA Large (3+) (A) Negative    Assessment and Plan :   PDMP not reviewed this encounter.  1. Acute cystitis with hematuria   2. History of renal calculi     Start Keflex to cover for acute cystitis, urine culture pending.  Recommended aggressive hydration, limiting urinary irritants. Counseled patient on potential for adverse effects with medications prescribed/recommended today, ER and return-to-clinic precautions discussed, patient verbalized understanding.    Jaynee Eagles, [place] 06/29/20 1654

## 2020-06-29 NOTE — Telephone Encounter (Signed)
Forms complete, sent mychart response advising patient that forms are completed and he needs to pay $29 fee. Forms are in my office. -pr

## 2020-06-29 NOTE — Telephone Encounter (Signed)
Patriece, Did you scan the documents into the chart?  I could not find them.  He wants them faxed to the # he provided in his note.  Thank you.

## 2020-06-29 NOTE — Discharge Instructions (Addendum)

## 2020-07-02 LAB — URINE CULTURE: Culture: >=100000 — AB

## 2020-07-05 NOTE — Telephone Encounter (Signed)
Please see patient mychart message  Thank you for sending in the forms.   The claims manager from [employer] asked me to ask you if you could put the first day I was out of work (05/31/2020) on the forms?   If you need any documentation or releases from let me know.  I would also cover the cost of faxing the forms again.  Thank you

## 2020-07-06 MED FILL — PANTOPRAZOLE SOD DR 40 MG T: 40 | 90 days supply | Qty: 90 | Fill #1

## 2020-07-06 MED FILL — TRELEGY ELLIPTA 200-62.5-25: 200-62.5-25 | 30 days supply | Qty: 60 | Fill #0

## 2020-07-12 ENCOUNTER — Telehealth (INDEPENDENT_AMBULATORY_CARE_PROVIDER_SITE_OTHER): Payer: 59 | Admitting: Internal Medicine

## 2020-07-12 ENCOUNTER — Encounter: Payer: Self-pay | Admitting: Internal Medicine

## 2020-07-12 ENCOUNTER — Other Ambulatory Visit: Payer: Self-pay

## 2020-07-12 DIAGNOSIS — E669 Obesity, unspecified: Secondary | ICD-10-CM

## 2020-07-12 DIAGNOSIS — Z7689 Persons encountering health services in other specified circumstances: Secondary | ICD-10-CM

## 2020-07-12 DIAGNOSIS — R072 Precordial pain: Secondary | ICD-10-CM | POA: Diagnosis not present

## 2020-07-12 DIAGNOSIS — R7303 Prediabetes: Secondary | ICD-10-CM | POA: Insufficient documentation

## 2020-07-12 DIAGNOSIS — R0602 Shortness of breath: Secondary | ICD-10-CM | POA: Diagnosis not present

## 2020-07-12 DIAGNOSIS — I10 Essential (primary) hypertension: Secondary | ICD-10-CM | POA: Diagnosis not present

## 2020-07-12 DIAGNOSIS — J4520 Mild intermittent asthma, uncomplicated: Secondary | ICD-10-CM | POA: Diagnosis not present

## 2020-07-12 NOTE — Progress Notes (Signed)
Virtual Visit via Telephone Note  I connected with Zachary Jacobs, on 07/12/2020 at 10:07 AM by telephone due to the COVID-19 pandemic and verified that I am speaking with the correct person using two identifiers.   Consent: I discussed the limitations, risks, security and privacy concerns of performing an evaluation and management service by telephone and the availability of in person appointments. I also discussed with the patient that there may be a patient responsible charge related to this service. The patient expressed understanding and agreed to proceed.   Location of Patient: Home   Location of [facility] Stripling: Clinic    Persons participating in Telemedicine visit: Dawid Dupriest Dr. Juleen China    History of Present Illness: Patient has a visit to establish care. Was previously established with [facility], but that office is now closing. Established with Pulmonology. Was previously diagnosed with COPD but pulmonology told him he actually has hyperinflation with eosinophilia---more than likely late onset asthma. FMLA paperwork has been completed by pulmonology.   Followed by cardiology for incomplete LBBB, new diagnosis. Had echocardiogram that showed reduced EF and did not do well on stress test. Had cardiac catheterization done without intervention.   PMH of recurrent renal stones. Established with [facility]. On Flomax.   Has been working on weight loss. Last weighed at 259 lbs. Heaviest was 294 lbs. Has been really working on diet. Walking 1 mile per day.     Past Medical History:  Diagnosis Date  . Allergy   . Anemia    as child  . COPD, mild ([place])    very mild  . History of kidney stones   . OSA (obstructive sleep apnea)    s/p  surgery 1996  . Pneumonia   . Pre-diabetes   . Renal calculus, left   . Renal cyst, right    SIMPLE  . Sleep apnea     cpap   dont wear latley   No Known Allergies  Current Outpatient Medications on File Prior to  Visit  Medication Sig Dispense Refill  . aspirin EC 81 MG tablet Take 81 mg by mouth daily. Swallow whole.    . fluticasone (FLONASE) 50 MCG/ACT nasal spray Place 1 spray into both nostrils daily as needed for allergies or rhinitis.    . Fluticasone-Umeclidin-Vilant (TRELEGY ELLIPTA) 200-62.5-25 MCG/INH AEPB Inhale 1 puff into the lungs daily. 2 each 0  . levalbuterol (XOPENEX HFA) 45 MCG/ACT inhaler Inhale 1-2 puffs into the lungs every 4 (four) hours as needed for wheezing. 1 each 0  . losartan (COZAAR) 25 MG tablet Take 1 tablet (25 mg total) by mouth daily. 30 tablet 3  . nitroGLYCERIN (NITROSTAT) 0.4 MG SL tablet Place 0.4 mg under the tongue every 5 (five) minutes as needed for chest pain.    . pantoprazole (PROTONIX) 40 MG tablet Take 1 tablet (40 mg total) by mouth daily. 30 tablet 11  . tamsulosin (FLOMAX) 0.4 MG CAPS capsule Take 0.8 mg by mouth every evening.    . Tetrahydrozoline HCl (VISINE OP) Place 1 drop into both eyes daily as needed (allergies).    . cephALEXin (KEFLEX) 500 MG capsule Take 1 capsule (500 mg total) by mouth 2 (two) times daily. 14 capsule 0  . famotidine (PEPCID) 10 MG tablet Take 10 mg by mouth daily as needed for heartburn or indigestion.     No current facility-administered medications on file prior to visit.    Observations/Objective: NAD. Speaking clearly.  Work of breathing normal.  Alert and oriented. Mood appropriate.   Assessment and Plan: 1. Encounter to establish care Reviewed patient's PMH, social history, surgical history, and medications.  Is overdue for annual exam, screening blood work, and health maintenance topics. Have asked patient to return for visit to address these items.   2. Obesity with serious comorbidity, unspecified classification, unspecified obesity type Continue to work on diet and exercise; has had good success with weight loss with lifestyle changes.   3. Prediabetes A1c 6.1 in Jan 2021. Discussed parameters for  prediabetes versus DM. Will need to monitor at upcoming in person exam.    Follow Up Instructions: Annual exam with fasting labs    I discussed the assessment and treatment plan with the patient. The patient was provided an opportunity to ask questions and all were answered. The patient agreed with the plan and demonstrated an understanding of the instructions.   The patient was advised to call back or seek an in-person evaluation if the symptoms worsen or if the condition fails to improve as anticipated.     I provided 16 minutes total of non-face-to-face time during this encounter including median intraservice time, reviewing previous notes, investigations, ordering medications, medical decision making, coordinating care and patient verbalized understanding at the end of the visit.    Phill Myron, D.O. Primary Care at [facility]  07/12/2020, 10:07 AM

## 2020-07-12 NOTE — Progress Notes (Signed)
Establish care FMLA completed by Pulmonologist Followed by [facility]  Followed by Doylene Canard, MD -Cardiology Desires blood work

## 2020-07-26 DIAGNOSIS — N200 Calculus of kidney: Secondary | ICD-10-CM | POA: Diagnosis not present

## 2020-08-12 MED FILL — Losartan Potassium Tab 25 MG: ORAL | 30 days supply | Qty: 30 | Fill #0 | Status: CN

## 2020-08-13 ENCOUNTER — Other Ambulatory Visit (HOSPITAL_COMMUNITY): Payer: Self-pay

## 2020-08-14 ENCOUNTER — Other Ambulatory Visit (HOSPITAL_COMMUNITY): Payer: Self-pay

## 2020-08-14 MED FILL — Losartan Potassium Tab 25 MG: ORAL | 30 days supply | Qty: 30 | Fill #0 | Status: AC

## 2020-08-21 DIAGNOSIS — N200 Calculus of kidney: Secondary | ICD-10-CM | POA: Diagnosis not present

## 2020-08-30 ENCOUNTER — Other Ambulatory Visit (HOSPITAL_COMMUNITY): Payer: Self-pay

## 2020-08-30 MED FILL — Tamsulosin HCl Cap 0.4 MG: ORAL | 90 days supply | Qty: 180 | Fill #0 | Status: AC

## 2020-09-17 ENCOUNTER — Encounter: Payer: Self-pay | Admitting: Pulmonary Disease

## 2020-09-17 ENCOUNTER — Ambulatory Visit: Payer: 59 | Admitting: Pulmonary Disease

## 2020-09-17 ENCOUNTER — Other Ambulatory Visit: Payer: Self-pay

## 2020-09-17 VITALS — BP 128/82 | HR 71 | Temp 97.4°F | Ht 70.0 in | Wt 259.0 lb

## 2020-09-17 DIAGNOSIS — J4520 Mild intermittent asthma, uncomplicated: Secondary | ICD-10-CM

## 2020-09-17 DIAGNOSIS — K219 Gastro-esophageal reflux disease without esophagitis: Secondary | ICD-10-CM

## 2020-09-17 NOTE — Patient Instructions (Signed)
Nice to see you  No changes to medications  Continue Trelegy and the heartburn medication  Follow up with Dr. Silas Flood in 6 months
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
## 2020-09-24 MED FILL — Losartan Potassium Tab 25 MG: ORAL | 30 days supply | Qty: 30 | Fill #1 | Status: AC

## 2020-09-25 ENCOUNTER — Other Ambulatory Visit (HOSPITAL_COMMUNITY): Payer: Self-pay

## 2020-10-12 ENCOUNTER — Telehealth (INDEPENDENT_AMBULATORY_CARE_PROVIDER_SITE_OTHER): Payer: 59 | Admitting: Family

## 2020-10-12 ENCOUNTER — Other Ambulatory Visit: Payer: Self-pay

## 2020-10-12 ENCOUNTER — Other Ambulatory Visit (HOSPITAL_COMMUNITY): Payer: Self-pay

## 2020-10-12 DIAGNOSIS — R059 Cough, unspecified: Secondary | ICD-10-CM | POA: Diagnosis not present

## 2020-10-12 DIAGNOSIS — U071 COVID-19: Secondary | ICD-10-CM

## 2020-10-12 DIAGNOSIS — R0981 Nasal congestion: Secondary | ICD-10-CM

## 2020-10-12 DIAGNOSIS — R509 Fever, unspecified: Secondary | ICD-10-CM

## 2020-10-12 MED ORDER — GUAIFENESIN 200 MG PO TABS
400.0000 mg | ORAL_TABLET | ORAL | 0 refills | Status: DC | PRN
  Filled 2020-10-12: qty 30, 3d supply, fill #0

## 2020-10-12 MED ORDER — NIRMATRELVIR/RITONAVIR (PAXLOVID)TABLET
3.0000 | ORAL_TABLET | Freq: Two times a day (BID) | ORAL | 0 refills | Status: AC
  Filled 2020-10-12: qty 30, 5d supply, fill #0

## 2020-10-12 MED ORDER — FLUTICASONE PROPIONATE 50 MCG/ACT NA SUSP
2.0000 | Freq: Every day | NASAL | 0 refills | Status: DC
  Filled 2020-10-12: qty 16, 30d supply, fill #0

## 2020-10-12 MED ORDER — BENZONATATE 100 MG PO CAPS
100.0000 mg | ORAL_CAPSULE | Freq: Three times a day (TID) | ORAL | 0 refills | Status: DC | PRN
  Filled 2020-10-12: qty 20, 7d supply, fill #0

## 2020-10-12 MED FILL — Fluticasone-Umeclidinium-Vilanterol AEPB 200-62.5-25 MCG/ACT: RESPIRATORY_TRACT | 30 days supply | Qty: 60 | Fill #0 | Status: AC

## 2020-10-12 NOTE — Progress Notes (Signed)
Virtual Visit via Telephone Note  I connected with Zachary Jacobs, on 10/12/2020 at 11:14 AM by telephone due to the COVID-19 pandemic and verified that I am speaking with the correct person using two identifiers.  Due to current restrictions/limitations of in-office visits due to the COVID-19 pandemic, this scheduled clinical appointment was converted to a telehealth visit.   Consent: I discussed the limitations, risks, security and privacy concerns of performing an evaluation and management service by telephone and the availability of in person appointments. I also discussed with the patient that there may be a patient responsible charge related to this service. The patient expressed understanding and agreed to proceed.   Location of Patient: Home  Location of Provider: [facility] participating in Telemedicine visit: [place], NP Elmon Else, [place]   History of Present Illness: Zachary Jacobs. Bowns is a 65 year-old male who presents for Covid-19.   Reports fever began about 4 days ago. The following day he tested positive for Covid. Endorses 103 temperature which broke on yesterday (initially took Ibuprofen), nasal congestion, cough with yellowish phlegm. Reports does have history of sinus and allergies and taking Xyzal. Feeling mostly fatigued. Shortness of breath infrequently. Denies chest pain. He has a Advertising account planner of which he sees as scheduled. Plans to follow-up with both specialties soon for follow-up in the setting of current diagnosis with Covid-19.   Past Medical History:  Diagnosis Date   Allergy    Anemia    as child   COPD, mild ([place])    very mild   History of kidney stones    OSA (obstructive sleep apnea)    s/p  surgery 1996   Pneumonia    Pre-diabetes    Renal calculus, left    Renal cyst, right    SIMPLE   Sleep apnea     cpap   dont wear latley   No Known  Allergies  Current Outpatient Medications on File Prior to Visit  Medication Sig Dispense Refill   aspirin EC 81 MG tablet Take 81 mg by mouth daily. Swallow whole.     fluticasone (FLONASE) 50 MCG/ACT nasal spray Place 1 spray into both nostrils daily as needed for allergies or rhinitis.     Fluticasone-Umeclidin-Vilant 200-62.5-25 MCG/INH AEPB INHALE 1 PUFF INTO THE LUNGS DAILY. 60 each 11   levalbuterol (XOPENEX HFA) 45 MCG/ACT inhaler INHALE 1-2 PUFFS INTO THE LUNGS EVERY 4 (FOUR) HOURS AS NEEDED FOR WHEEZING. 15 g 0   losartan (COZAAR) 25 MG tablet TAKE 1 TABLET (25 MG TOTAL) BY MOUTH DAILY. 30 tablet 3   nitroGLYCERIN (NITROSTAT) 0.4 MG SL tablet Place 0.4 mg under the tongue every 5 (five) minutes as needed for chest pain.     pantoprazole (PROTONIX) 40 MG tablet TAKE 1 TABLET (40 MG TOTAL) BY MOUTH DAILY. 30 tablet 11   predniSONE (DELTASONE) 20 MG tablet TAKE 2 TABLETS (40 MG TOTAL) BY MOUTH DAILY WITH BREAKFAST FOR 7 DAYS. (Patient not taking: Reported on 09/17/2020) 14 tablet 0   Spacer/Aero-Holding Chambers (AEROCHAMBER PLUS FLO-VU LARGE) MISC USE AS DIRECTED WITH INHALER 1 each 0   tamsulosin (FLOMAX) 0.4 MG CAPS capsule Take 0.8 mg by mouth every evening.     tamsulosin (FLOMAX) 0.4 MG CAPS capsule TAKE 2 CAPSULES BY MOUTH AT BEDTIME. 180 capsule 3   Tetrahydrozoline HCl (VISINE OP) Place 1 drop into both eyes daily as needed (allergies).     [  DISCONTINUED] albuterol (VENTOLIN HFA) 108 (90 Base) MCG/ACT inhaler Inhale 2 puffs into the lungs every 4 (four) hours as needed for wheezing or shortness of breath. 1 each 0   [DISCONTINUED] budesonide-formoterol (SYMBICORT) 80-4.5 MCG/ACT inhaler Inhale 2 puffs into the lungs in the morning and at bedtime. 1 each 0   [DISCONTINUED] furosemide (LASIX) 20 MG tablet Take 1 tablet (20 mg total) by mouth daily. 10 tablet 0   No current facility-administered medications on file prior to visit.    Observations/Objective: Alert and oriented x 3.  Not in acute distress. Physical examination not completed as this is a telemedicine visit.  Assessment and Plan: 1. COVID-19: - Nirmatrelvir/Ritonavir tablets as prescribed.  - Patient planning to follow-up with his Cardiologist and Pulmonologist soon. - Follow-up with primary provider as scheduled.  - nirmatrelvir/ritonavir EUA (PAXLOVID) TABS; Take 3 tablets by mouth 2 (two) times daily for 5 days. Take nirmatrelvir 150 mg two tablets twice daily for 5 days and ritonavir 100 mg one tablet twice daily x5 days.  Dispense: 30 tablet; Refill: 0  2. Nasal congestion: - Fluticasone nasal spray as prescribed.  - fluticasone (FLONASE) 50 MCG/ACT nasal spray; Place 2 sprays into both nostrils daily.  Dispense: 16 g; Refill: 0  3. Cough: - Guaifenesin as prescribed.  - guaiFENesin 200 MG tablet; Take 2 tablets (400 mg total) by mouth every 4 (four) hours as needed for cough or to loosen phlegm.  Dispense: 30 suppository; Refill: 0 - benzonatate (TESSALON PERLES) 100 MG capsule; Take 1 capsule (100 mg total) by mouth 3 (three) times daily as needed for cough.  Dispense: 20 capsule; Refill: 0  4. Fever, unspecified fever cause: - Patient reports resolved 1 day ago.    Follow Up Instructions: Follow-up with primary provider as scheduled. Keep all appointments with Cardiology and Pulmonology.    Patient was given clear instructions to go to Emergency Department or return to medical center if symptoms don't improve, worsen, or new problems develop.The patient verbalized understanding.  I discussed the assessment and treatment plan with the patient. The patient was provided an opportunity to ask questions and all were answered. The patient agreed with the plan and demonstrated an understanding of the instructions.   The patient was advised to call back or seek an in-person evaluation if the symptoms worsen or if the condition fails to improve as anticipated.    I provided 20 minutes total of  non-face-to-face time during this encounter.   [name], NP  [facility] [place], [place] 10/12/2020, 11:14 AM

## 2020-10-12 NOTE — Progress Notes (Signed)
Pt presents for telemedicine visit tested positive for COVID on 6/15 symptoms include 103 temp, nasal congestion, cough w/yellowish phelgm

## 2020-10-29 ENCOUNTER — Other Ambulatory Visit: Payer: Self-pay

## 2020-10-29 MED FILL — Pantoprazole Sodium EC Tab 40 MG (Base Equiv): ORAL | 30 days supply | Qty: 30 | Fill #0 | Status: AC

## 2020-10-30 ENCOUNTER — Other Ambulatory Visit (HOSPITAL_COMMUNITY): Payer: Self-pay

## 2020-10-31 ENCOUNTER — Other Ambulatory Visit (HOSPITAL_COMMUNITY): Payer: Self-pay

## 2020-10-31 ENCOUNTER — Other Ambulatory Visit: Payer: Self-pay

## 2020-11-01 ENCOUNTER — Other Ambulatory Visit (HOSPITAL_COMMUNITY): Payer: Self-pay

## 2020-11-02 ENCOUNTER — Other Ambulatory Visit (HOSPITAL_COMMUNITY): Payer: Self-pay

## 2020-11-05 ENCOUNTER — Other Ambulatory Visit: Payer: Self-pay

## 2020-11-05 ENCOUNTER — Other Ambulatory Visit (HOSPITAL_COMMUNITY): Payer: Self-pay

## 2020-11-05 MED ORDER — LOSARTAN POTASSIUM 25 MG PO TABS
25.0000 mg | ORAL_TABLET | Freq: Every day | ORAL | 3 refills | Status: DC
  Filled 2020-11-05: qty 90, 90d supply, fill #0
  Filled 2021-03-20: qty 90, 90d supply, fill #1
  Filled 2021-10-25: qty 30, 30d supply, fill #1

## 2020-11-12 DIAGNOSIS — E6609 Other obesity due to excess calories: Secondary | ICD-10-CM | POA: Diagnosis not present

## 2020-11-12 DIAGNOSIS — R002 Palpitations: Secondary | ICD-10-CM | POA: Diagnosis not present

## 2020-11-12 DIAGNOSIS — R0602 Shortness of breath: Secondary | ICD-10-CM | POA: Diagnosis not present

## 2020-11-12 DIAGNOSIS — J449 Chronic obstructive pulmonary disease, unspecified: Secondary | ICD-10-CM | POA: Diagnosis not present

## 2020-11-12 DIAGNOSIS — R072 Precordial pain: Secondary | ICD-10-CM | POA: Diagnosis not present

## 2020-11-15 ENCOUNTER — Other Ambulatory Visit: Payer: Self-pay

## 2020-12-25 ENCOUNTER — Other Ambulatory Visit (HOSPITAL_COMMUNITY): Payer: Self-pay

## 2020-12-25 MED FILL — Pantoprazole Sodium EC Tab 40 MG (Base Equiv): ORAL | 30 days supply | Qty: 30 | Fill #1 | Status: AC

## 2020-12-25 MED FILL — Fluticasone-Umeclidinium-Vilanterol AEPB 200-62.5-25 MCG/ACT: RESPIRATORY_TRACT | 30 days supply | Qty: 60 | Fill #1 | Status: AC

## 2020-12-25 MED FILL — Tamsulosin HCl Cap 0.4 MG: ORAL | 90 days supply | Qty: 180 | Fill #1 | Status: AC

## 2021-01-05 ENCOUNTER — Telehealth: Payer: 59 | Admitting: Nurse Practitioner

## 2021-01-05 DIAGNOSIS — H1013 Acute atopic conjunctivitis, bilateral: Secondary | ICD-10-CM | POA: Diagnosis not present

## 2021-01-05 NOTE — Progress Notes (Signed)
E-Visit for [name]   We are sorry that you are not feeling well.  Here is how we plan to help!  Based on what you have shared with me it looks like you have conjunctivitis.  Conjunctivitis is a common inflammatory or infectious condition of the eye that is often referred to as "pink eye".  In most cases it is contagious (viral or bacterial). However, not all conjunctivitis requires antibiotics (ex. Allergic).  We have made appropriate suggestions for you based upon your presentation.  I recommend that you use OpconA, 1-2 drops every 4-6 hours (an over the counter allergy drop available at your local pharmacy).  Your pharmacist may have an alternative suggestion.  Pink eye can be highly contagious.  It is typically spread through direct contact with secretions, or contaminated objects or surfaces that one may have touched.  Strict handwashing is suggested with soap and water is urged.  If not available, use alcohol based had sanitizer.  Avoid unnecessary touching of the eye.  If you wear contact lenses, you will need to refrain from wearing them until you see no white discharge from the eye for at least 24 hours after being on medication.  You should see symptom improvement in 1-2 days after starting the medication regimen.  Call us if symptoms are not improved in 1-2 days.  Home Care: Wash your hands often! Do not wear your contacts until you complete your treatment plan. Avoid sharing towels, bed linen, personal items with a person who has pink eye. See attention for anyone in your home with similar symptoms.  Get Help Right Away If: Your symptoms do not improve. You develop blurred or loss of vision. Your symptoms worsen (increased discharge, pain or redness)   Thank you for choosing an e-visit.  Your e-visit answers were reviewed by a board certified advanced clinical practitioner to complete your personal care plan. Depending upon the condition, your plan could have included both over  the counter or prescription medications.  Please review your pharmacy choice. Make sure the pharmacy is open so you can pick up prescription now. If there is a problem, you may contact your provider through [employer] and have the prescription routed to another pharmacy.  Your safety is important to [place]. If you have drug allergies check your prescription carefully.   For the next 24 hours you can use MyChart to ask questions about today's visit, request a non-urgent call back, or ask for a work or school excuse. You will get an email in the next two days asking about your experience. I hope that your e-visit has been valuable and will speed your recovery.  5-10 minutes spent reviewing and documenting in chart.

## 2021-02-05 ENCOUNTER — Other Ambulatory Visit (HOSPITAL_COMMUNITY): Payer: Self-pay

## 2021-02-05 MED FILL — Pantoprazole Sodium EC Tab 40 MG (Base Equiv): ORAL | 30 days supply | Qty: 30 | Fill #2 | Status: AC

## 2021-03-20 ENCOUNTER — Other Ambulatory Visit (HOSPITAL_COMMUNITY): Payer: Self-pay

## 2021-03-20 MED ORDER — TRELEGY ELLIPTA 200-62.5-25 MCG/ACT IN AEPB
1.0000 | INHALATION_SPRAY | Freq: Every day | RESPIRATORY_TRACT | 8 refills | Status: DC
  Filled 2021-03-20: qty 60, 30d supply, fill #0

## 2021-03-20 MED FILL — Pantoprazole Sodium EC Tab 40 MG (Base Equiv): ORAL | 30 days supply | Qty: 30 | Fill #3 | Status: AC

## 2021-03-22 ENCOUNTER — Other Ambulatory Visit (HOSPITAL_COMMUNITY): Payer: Self-pay

## 2021-03-25 ENCOUNTER — Other Ambulatory Visit (HOSPITAL_COMMUNITY): Payer: Self-pay

## 2021-03-27 ENCOUNTER — Other Ambulatory Visit (HOSPITAL_COMMUNITY): Payer: Self-pay

## 2021-03-27 MED ORDER — LOSARTAN POTASSIUM 25 MG PO TABS
25.0000 mg | ORAL_TABLET | Freq: Every day | ORAL | 3 refills | Status: DC
  Filled 2021-03-27 – 2021-11-08 (×3): qty 90, 90d supply, fill #0
  Filled 2022-02-25: qty 90, 90d supply, fill #1

## 2021-04-04 ENCOUNTER — Other Ambulatory Visit (HOSPITAL_COMMUNITY): Payer: Self-pay

## 2021-05-03 ENCOUNTER — Ambulatory Visit: Payer: 59 | Attending: Internal Medicine

## 2021-05-03 DIAGNOSIS — Z23 Encounter for immunization: Secondary | ICD-10-CM

## 2021-05-03 NOTE — Progress Notes (Signed)
° °  Covid-19 Vaccination Clinic  Name:  RUE TINNEL    MRN: [id number] DOB: [birth year]  05/03/2021  Mr. Pousson was observed post Covid-19 immunization for 15 minutes without incident. He was provided with Vaccine Information Sheet and instruction to access the V-Safe system.   Mr. Chewning was instructed to call 911 with any severe reactions post vaccine: Difficulty breathing  Swelling of face and throat  A fast heartbeat  A bad rash all over body  Dizziness and weakness   Immunizations Administered     Name Date Dose VIS Date Route   Pfizer Covid-19 Vaccine Bivalent Booster 05/03/2021  9:08 AM 0.3 mL 12/26/2020 Intramuscular   Manufacturer: [place]   Lot: [id number]   [place]: [phone]

## 2021-05-06 ENCOUNTER — Other Ambulatory Visit (HOSPITAL_BASED_OUTPATIENT_CLINIC_OR_DEPARTMENT_OTHER): Payer: Self-pay

## 2021-05-06 MED ORDER — PFIZER COVID-19 VAC BIVALENT 30 MCG/0.3ML IM SUSP
INTRAMUSCULAR | 0 refills | Status: DC
  Filled 2021-05-06: qty 0.3, 1d supply, fill #0

## 2021-05-24 ENCOUNTER — Other Ambulatory Visit (HOSPITAL_COMMUNITY): Payer: Self-pay

## 2021-05-24 MED FILL — Pantoprazole Sodium EC Tab 40 MG (Base Equiv): ORAL | 30 days supply | Qty: 30 | Fill #4 | Status: AC

## 2021-07-12 ENCOUNTER — Other Ambulatory Visit: Payer: Self-pay

## 2021-07-12 ENCOUNTER — Other Ambulatory Visit: Payer: Self-pay | Admitting: Pulmonary Disease

## 2021-07-12 ENCOUNTER — Other Ambulatory Visit (HOSPITAL_COMMUNITY): Payer: Self-pay

## 2021-07-12 MED ORDER — PANTOPRAZOLE SODIUM 40 MG PO TBEC
40.0000 mg | DELAYED_RELEASE_TABLET | Freq: Every day | ORAL | 11 refills | Status: DC
  Filled 2021-07-12: qty 30, 30d supply, fill #0
  Filled 2021-08-20: qty 30, 30d supply, fill #1
  Filled 2021-09-27: qty 30, 30d supply, fill #2
  Filled 2021-10-25: qty 90, 90d supply, fill #3
  Filled 2021-10-25 – 2021-11-08 (×2): qty 30, 30d supply, fill #3
  Filled 2022-01-14: qty 30, 30d supply, fill #4
  Filled 2022-02-25: qty 30, 30d supply, fill #5
  Filled 2022-04-11: qty 30, 30d supply, fill #6
  Filled 2022-05-15: qty 30, 30d supply, fill #7
  Filled 2022-05-20: qty 90, 90d supply, fill #7

## 2021-07-15 ENCOUNTER — Other Ambulatory Visit (HOSPITAL_COMMUNITY): Payer: Self-pay

## 2021-07-15 ENCOUNTER — Other Ambulatory Visit: Payer: Self-pay

## 2021-07-15 DIAGNOSIS — N200 Calculus of kidney: Secondary | ICD-10-CM | POA: Diagnosis not present

## 2021-07-15 DIAGNOSIS — B954 Other streptococcus as the cause of diseases classified elsewhere: Secondary | ICD-10-CM | POA: Diagnosis not present

## 2021-07-15 DIAGNOSIS — N390 Urinary tract infection, site not specified: Secondary | ICD-10-CM | POA: Diagnosis not present

## 2021-07-15 DIAGNOSIS — R3912 Poor urinary stream: Secondary | ICD-10-CM | POA: Diagnosis not present

## 2021-07-15 DIAGNOSIS — R8279 Other abnormal findings on microbiological examination of urine: Secondary | ICD-10-CM | POA: Diagnosis not present

## 2021-07-15 DIAGNOSIS — N401 Enlarged prostate with lower urinary tract symptoms: Secondary | ICD-10-CM | POA: Diagnosis not present

## 2021-07-15 MED ORDER — TAMSULOSIN HCL 0.4 MG PO CAPS
0.4000 mg | ORAL_CAPSULE | Freq: Every day | ORAL | 3 refills | Status: DC
  Filled 2021-07-15: qty 90, 90d supply, fill #0

## 2021-07-15 MED ORDER — CEPHALEXIN 500 MG PO CAPS
500.0000 mg | ORAL_CAPSULE | Freq: Three times a day (TID) | ORAL | 0 refills | Status: DC
  Filled 2021-07-15: qty 21, 7d supply, fill #0

## 2021-07-24 ENCOUNTER — Other Ambulatory Visit (HOSPITAL_COMMUNITY): Payer: Self-pay

## 2021-07-24 DIAGNOSIS — N200 Calculus of kidney: Secondary | ICD-10-CM | POA: Diagnosis not present

## 2021-07-24 DIAGNOSIS — R3914 Feeling of incomplete bladder emptying: Secondary | ICD-10-CM | POA: Diagnosis not present

## 2021-07-24 DIAGNOSIS — N401 Enlarged prostate with lower urinary tract symptoms: Secondary | ICD-10-CM | POA: Diagnosis not present

## 2021-07-24 MED ORDER — SILODOSIN 8 MG PO CAPS
8.0000 mg | ORAL_CAPSULE | Freq: Every day | ORAL | 3 refills | Status: DC
  Filled 2021-07-24: qty 90, 90d supply, fill #0
  Filled 2021-10-25 – 2021-11-08 (×2): qty 90, 90d supply, fill #1
  Filled 2022-02-25: qty 90, 90d supply, fill #2
  Filled 2022-05-15 – 2022-05-20 (×2): qty 90, 90d supply, fill #3

## 2021-07-26 ENCOUNTER — Ambulatory Visit: Payer: 59 | Admitting: Pulmonary Disease

## 2021-07-26 ENCOUNTER — Other Ambulatory Visit (HOSPITAL_COMMUNITY): Payer: Self-pay

## 2021-07-26 ENCOUNTER — Encounter: Payer: Self-pay | Admitting: Pulmonary Disease

## 2021-07-26 VITALS — BP 128/64 | HR 66 | Temp 98.1°F | Ht 70.0 in | Wt 284.6 lb

## 2021-07-26 DIAGNOSIS — J4520 Mild intermittent asthma, uncomplicated: Secondary | ICD-10-CM | POA: Diagnosis not present

## 2021-07-26 DIAGNOSIS — R911 Solitary pulmonary nodule: Secondary | ICD-10-CM | POA: Diagnosis not present

## 2021-07-26 MED ORDER — TRELEGY ELLIPTA 200-62.5-25 MCG/ACT IN AEPB
1.0000 | INHALATION_SPRAY | Freq: Every day | RESPIRATORY_TRACT | 11 refills | Status: DC
Start: 2021-07-26 — End: 2023-05-05
  Filled 2021-07-26 – 2021-11-08 (×3): qty 60, 30d supply, fill #0
  Filled 2022-01-14: qty 60, 30d supply, fill #1
  Filled 2022-02-25: qty 60, 30d supply, fill #2
  Filled 2022-04-11: qty 60, 30d supply, fill #3
  Filled 2022-05-15 – 2022-05-20 (×2): qty 60, 30d supply, fill #4

## 2021-07-26 MED ORDER — LEVALBUTEROL TARTRATE 45 MCG/ACT IN AERO
1.0000 | INHALATION_SPRAY | Freq: Four times a day (QID) | RESPIRATORY_TRACT | 11 refills | Status: DC | PRN
  Filled 2021-07-26 – 2021-11-08 (×3): qty 15, 50d supply, fill #0

## 2021-07-26 NOTE — Patient Instructions (Addendum)
Nice to see you again ? ?Continue Trelegy and xopenex ? ?I refilled both  ? ?I ordered a repeat CT scan to check that tiny nodule from last year ? ?RTC in 6 months or sooner as needed ?
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
## 2021-08-01 ENCOUNTER — Telehealth (HOSPITAL_BASED_OUTPATIENT_CLINIC_OR_DEPARTMENT_OTHER): Payer: Self-pay

## 2021-08-05 ENCOUNTER — Ambulatory Visit (HOSPITAL_BASED_OUTPATIENT_CLINIC_OR_DEPARTMENT_OTHER)
Admission: RE | Admit: 2021-08-05 | Discharge: 2021-08-05 | Disposition: A | Discharge status: Home / Self Care | Payer: 59 | Source: Ambulatory Visit | Attending: Pulmonary Disease | Admitting: Pulmonary Disease

## 2021-08-05 ENCOUNTER — Other Ambulatory Visit (HOSPITAL_COMMUNITY): Payer: Self-pay

## 2021-08-05 DIAGNOSIS — R911 Solitary pulmonary nodule: Secondary | ICD-10-CM | POA: Insufficient documentation

## 2021-08-05 DIAGNOSIS — I700 Atherosclerosis of aorta: Secondary | ICD-10-CM | POA: Diagnosis not present

## 2021-08-05 DIAGNOSIS — I2510 Atherosclerotic heart disease of native coronary artery without angina pectoris: Secondary | ICD-10-CM | POA: Diagnosis not present

## 2021-08-05 DIAGNOSIS — R918 Other nonspecific abnormal finding of lung field: Secondary | ICD-10-CM | POA: Diagnosis not present

## 2021-08-12 NOTE — Progress Notes (Signed)
Nodules stable, no further follow up needed. Some concern for early signs of scarring in lung. I am less concerned but can consider a follow up CT scan in another year.

## 2021-08-13 ENCOUNTER — Other Ambulatory Visit: Payer: Self-pay

## 2021-08-13 DIAGNOSIS — R911 Solitary pulmonary nodule: Secondary | ICD-10-CM

## 2021-08-20 ENCOUNTER — Other Ambulatory Visit (HOSPITAL_COMMUNITY): Payer: Self-pay

## 2021-09-18 ENCOUNTER — Ambulatory Visit
Admission: EM | Admit: 2021-09-18 | Discharge: 2021-09-18 | Disposition: A | Discharge status: Home / Self Care | Payer: 59

## 2021-09-18 DIAGNOSIS — R200 Anesthesia of skin: Secondary | ICD-10-CM
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
## 2021-09-18 NOTE — ED Triage Notes (Signed)
Pt c/o right foot numbness

## 2021-09-18 NOTE — Discharge Instructions (Signed)
Please follow-up with podiatry at provided contact information for further evaluation and management.

## 2021-09-26 ENCOUNTER — Encounter: Payer: Self-pay | Admitting: Family Medicine

## 2021-09-26 ENCOUNTER — Ambulatory Visit: Payer: 59 | Admitting: Family Medicine

## 2021-09-26 VITALS — BP 116/79 | HR 57 | Temp 98.1°F | Resp 16 | Ht 70.0 in | Wt 282.2 lb

## 2021-09-26 DIAGNOSIS — R200 Anesthesia of skin: Secondary | ICD-10-CM | POA: Diagnosis not present

## 2021-09-27 ENCOUNTER — Encounter: Payer: Self-pay | Admitting: Family Medicine

## 2021-09-27 ENCOUNTER — Other Ambulatory Visit (HOSPITAL_COMMUNITY): Payer: Self-pay

## 2021-09-27 NOTE — Progress Notes (Signed)
Established Patient Office Visit  Subjective    [name], male    DOB: 1955/10/03  Age: 66 y.o. MRN: [id number]  CC: No chief complaint on file.   HPI ANSEN SAYEGH presents to establish care with a new provider and with complaint of right foot numbness/pain. He was seen in UC recently for sx and it was felt it was not likely vascular in nature. Patient denies known trauma or injury.    Outpatient Encounter Medications as of 09/26/2021  Medication Sig   aspirin EC 81 MG tablet Take 81 mg by mouth daily. Swallow whole.   COVID-19 mRNA bivalent vaccine, Pfizer, (PFIZER COVID-19 VAC BIVALENT) injection Inject into the muscle.   fluticasone (FLONASE) 50 MCG/ACT nasal spray Place 2 sprays into both nostrils daily.   Fluticasone-Umeclidin-Vilant (TRELEGY ELLIPTA) 200-62.5-25 MCG/ACT AEPB Inhale 1 puff into the lungs daily.   levalbuterol (XOPENEX HFA) 45 MCG/ACT inhaler Inhale 1 puff into the lungs every 6 (six) hours as needed for wheezing.   losartan (COZAAR) 25 MG tablet TAKE 1 TABLET (25 MG TOTAL) BY MOUTH DAILY.   losartan (COZAAR) 25 MG tablet Take 1 tablet (25 mg total) by mouth daily. (Patient not taking: Reported on 07/26/2021)   losartan (COZAAR) 25 MG tablet Take 1 tablet (25 mg total) by mouth daily. (Patient not taking: Reported on 07/26/2021)   nitroGLYCERIN (NITROSTAT) 0.4 MG SL tablet Place 0.4 mg under the tongue every 5 (five) minutes as needed for chest pain. (Patient not taking: Reported on 07/26/2021)   pantoprazole (PROTONIX) 40 MG tablet Take 1 tablet (40 mg total) by mouth daily.   silodosin (RAPAFLO) 8 MG CAPS capsule Take 1 capsule (8 mg total) by mouth daily.   tamsulosin (FLOMAX) 0.4 MG CAPS capsule Take 1 capsule (0.4 mg total) by mouth daily. (Patient not taking: Reported on 07/26/2021)   Tetrahydrozoline HCl (VISINE OP) Place 1 drop into both eyes daily as needed (allergies).   [DISCONTINUED] albuterol (VENTOLIN HFA) 108 (90 Base) MCG/ACT  inhaler Inhale 2 puffs into the lungs every 4 (four) hours as needed for wheezing or shortness of breath.   [DISCONTINUED] budesonide-formoterol (SYMBICORT) 80-4.5 MCG/ACT inhaler Inhale 2 puffs into the lungs in the morning and at bedtime.   [DISCONTINUED] furosemide (LASIX) 20 MG tablet Take 1 tablet (20 mg total) by mouth daily.   No facility-administered encounter medications on file as of 09/26/2021.    Past Medical History:  Diagnosis Date   Allergy    Anemia    as child   COPD, mild ([place])    very mild   History of kidney stones    OSA (obstructive sleep apnea)    s/p  surgery 1996   Pneumonia    Pre-diabetes    Renal calculus, left    Renal cyst, right    SIMPLE   Sleep apnea     cpap   dont wear latley    Past Surgical History:  Procedure Laterality Date   CYSTO/  RIGHT URETEROSCOPY/  STENT PLACEMENT  10/ 2001   CYSTOSCOPY/URETEROSCOPY/HOLMIUM LASER/STENT PLACEMENT Left 03/30/2015   Procedure: LEFT URETEROSCOPY/HOLMIUM LASER LITHOTRIPSY/STENT PLACEMENT, WITH  STONE BASKET EXTRACTION;  Surgeon: Kathie Rhodes, MD;  Location: [facility];  Service: Urology;  Laterality: Left;   HOLMIUM LASER APPLICATION Left 80/0/3491   Procedure: HOLMIUM LASER APPLICATION;  Surgeon: Kathie Rhodes, MD;  Location: [facility];  Service: Urology;  Laterality: Left;   IR URETERAL STENT LEFT NEW ACCESS W/O  SEP NEPHROSTOMY CATH  05/17/2019   LEFT HEART CATH AND CORONARY ANGIOGRAPHY N/A 06/15/2020   Procedure: LEFT HEART CATH AND CORONARY ANGIOGRAPHY;  Surgeon: Dixie Dials, MD;  Location: [facility];  Service: Cardiovascular;  Laterality: N/A;   NEPHROLITHOTOMY Left 03/20/2015   Procedure: NEPHROLITHOTOMY PERCUTANEOUS;  Surgeon: Kathie Rhodes, MD;  [facility];  Service: Urology;  Laterality: Left;   NEPHROLITHOTOMY Left 05/17/2019   Procedure: NEPHROLITHOTOMY PERCUTANEOUS;  Surgeon: Kathie Rhodes, MD;  Location[facility];  Service: Urology;  Laterality: Left;    TONSILLECTOMY     as child   UVULOPALATOPHARYNGOPLASTY  1996   nasal surgery    Family History  Problem Relation Age of Onset   Cancer Mother    Heart disease Father    Colon polyps Father    Cancer Other    Obesity Other    Colon cancer Neg Hx    Esophageal cancer Neg Hx    Rectal cancer Neg Hx    Stomach cancer Neg Hx     Social History   Socioeconomic History   Marital status: Married    Spouse name: Not on file   Number of children: Not on file   Years of education: Not on file   Highest education level: Not on file  Occupational History   Not on file  Tobacco Use   Smoking status: Former    Packs/day: 1.00    Years: 20.00    Pack years: 20.00    Types: Cigarettes    Quit date: 03/27/1984    Years since quitting: 37.5   Smokeless tobacco: Never  Vaping Use   Vaping Use: Not on file  Substance and Sexual Activity   Alcohol use: No   Drug use: No   Sexual activity: Not Currently  Other Topics Concern   Not on file  Social History Narrative   Not on file   Social Determinants of Health   Financial Resource Strain: Not on file  Food Insecurity: Not on file  Transportation Needs: Not on file  Physical Activity: Not on file  Stress: Not on file  Social Connections: Not on file  Intimate Partner Violence: Not on file    Review of Systems  All other systems reviewed and are negative.      Objective    BP 116/79   Pulse (!) 57   Temp 98.1 F (36.7 C) (Oral)   Resp 16   Ht '5\' 10"'$  (1.778 m)   Wt 282 lb 3.2 oz (128 kg)   SpO2 97%   BMI 40.49 kg/m   Physical Exam Vitals and nursing note reviewed.  Constitutional:      General: He is not in acute distress. Cardiovascular:     Rate and Rhythm: Normal rate and regular rhythm.  Pulmonary:     Effort: Pulmonary effort is normal.     Breath sounds: Normal breath sounds.  Musculoskeletal:     Right foot: Tenderness present. No swelling or deformity.     Left foot: Normal.     Comments: Sx  primarily on platar aspect around ball of foot and between digits 1 and 2  Neurological:     General: No focal deficit present.     Mental Status: He is alert and oriented to person, place, and time.        Assessment & Plan:   1. Numbness of right foot Referral to podiatry for further eval/mgt - Ambulatory referral to Podiatry    No  follow-ups on file.   Becky Sax, MD

## 2021-10-04 ENCOUNTER — Ambulatory Visit: Payer: 59 | Admitting: Podiatry

## 2021-10-04 ENCOUNTER — Ambulatory Visit (INDEPENDENT_AMBULATORY_CARE_PROVIDER_SITE_OTHER): Payer: 59

## 2021-10-04 ENCOUNTER — Encounter: Payer: Self-pay | Admitting: Podiatry

## 2021-10-04 ENCOUNTER — Ambulatory Visit: Payer: 59

## 2021-10-04 DIAGNOSIS — M7662 Achilles tendinitis, left leg: Secondary | ICD-10-CM | POA: Diagnosis not present

## 2021-10-04 DIAGNOSIS — M205X1 Other deformities of toe(s) (acquired), right foot: Secondary | ICD-10-CM

## 2021-10-04 DIAGNOSIS — G629 Polyneuropathy, unspecified: Secondary | ICD-10-CM | POA: Diagnosis not present

## 2021-10-04 DIAGNOSIS — M7661 Achilles tendinitis, right leg: Secondary | ICD-10-CM | POA: Diagnosis not present

## 2021-10-04 NOTE — Patient Instructions (Signed)

## 2021-10-04 NOTE — Progress Notes (Signed)
Subjective:   [name], male   DOB: 66 y.o.   MRN: [id number]   HPI Patient approximately 3 weeks ago developed some numbness in his foot and also has had chronic discomfort around his Achilles tendon right and left with the right being worse does have moderate obesity is complicating factor and is not currently smoking likes to be active.  Also has reduced range of motion of his first big toe joint   Review of Systems  All other systems reviewed and are negative.       Objective:  Physical Exam Vitals and nursing note reviewed.  Constitutional:      Appearance: He is well-developed.  Pulmonary:     Effort: Pulmonary effort is normal.  Musculoskeletal:        General: Normal range of motion.  Skin:    General: Skin is warm.  Neurological:     Mental Status: He is alert.     Neurovascular status intact muscle strength found to be adequate range of motion adequate.  Patient does have reduced range of motion first MPJ right has warmth around the right Achilles tendon insertion but only mild discomfort and has localized numbness of the right forefoot but no indications of loss of sharp dull vibratory.  Good digital perfusion well oriented x3     Assessment:  May have a slight nerve impingement but it is improving some hoping the numbness right will get better with chronic Achilles tendinitis right hallux limitus right     Plan:  H&P x-rays reviewed all conditions discussed and at this point I want him to monitor the numbness and if it continues to be a problem may need to have his back looked at to rule out impingement but has no muscle weakness currently no indications of anterior tibial or foot drop issues.  Achilles tendon I gave instructions on stretching exercises heel lift ice therapy and no treatment for hallux limitus as its not significantly tender at this time  X-rays indicate large posterior spur formation no indications of other posterior pathology  with hallux limitus spur formation right first MPJ narrowing of the joint surface

## 2021-10-25 ENCOUNTER — Other Ambulatory Visit (HOSPITAL_COMMUNITY): Payer: Self-pay

## 2021-11-04 ENCOUNTER — Other Ambulatory Visit (HOSPITAL_COMMUNITY): Payer: Self-pay

## 2021-11-04 ENCOUNTER — Telehealth: Payer: Self-pay

## 2021-11-04 ENCOUNTER — Ambulatory Visit
Admission: EM | Admit: 2021-11-04 | Discharge: 2021-11-04 | Disposition: A | Discharge status: Home / Self Care | Payer: 59 | Attending: Internal Medicine | Admitting: Internal Medicine

## 2021-11-04 DIAGNOSIS — M79675 Pain in left toe(s): Secondary | ICD-10-CM | POA: Diagnosis not present

## 2021-11-04 MED ORDER — PREDNISONE 20 MG PO TABS: 20.0000 mg | ORAL_TABLET | Freq: Every day | ORAL | 0 refills | Status: AC

## 2021-11-04 MED ORDER — PREDNISONE 20 MG PO TABS
20.0000 mg | ORAL_TABLET | Freq: Every day | ORAL | 0 refills | Status: DC
  Filled 2021-11-04: qty 5, 5d supply, fill #0

## 2021-11-04 NOTE — ED Triage Notes (Signed)
Patient presents to Urgent Care with complaints of pain in l foot/big toe no known injury since last night. Patient reports no otc medications.

## 2021-11-04 NOTE — Discharge Instructions (Signed)
I am suspicious that you may have gout so you are being treated with prednisone steroid.  Please follow-up with podiatry for further evaluation and management.

## 2021-11-04 NOTE — ED Provider Notes (Addendum)
[facility]    CSN: [id number] Arrival date & time: 11/04/21  1806      History   Chief Complaint Chief Complaint  Patient presents with   L toe pain    HPI Zachary Jacobs is a 66 y.o. male.   Patient presents with left great toe pain that started yesterday.  Denies any obvious injury.  Denies numbness or tingling.  He has not taken any medications for pain.  Patient was seen previously by podiatry for right foot numbness and pain but states that he has never had discomfort or pain in his left toe.  Denies eating any high purine foods lately. Denies fevers at home.      Past Medical History:  Diagnosis Date   Allergy    Anemia    as child   COPD, mild ([place])    very mild   History of kidney stones    OSA (obstructive sleep apnea)    s/p  surgery 1996   Pneumonia    Pre-diabetes    Renal calculus, left    Renal cyst, right    SIMPLE   Sleep apnea     cpap   dont wear latley    Patient Active Problem List   Diagnosis Date Noted   Prediabetes 07/12/2020   Unstable angina ([place]) 06/15/2020   Abnormal electrocardiogram during exercise stress test 06/15/2020   Lower urinary tract symptoms (LUTS) 03/13/2017   History of kidney stones 03/13/2017   Staghorn renal calculus 03/20/2015   Hydronephrosis     Past Surgical History:  Procedure Laterality Date   CYSTO/  RIGHT URETEROSCOPY/  STENT PLACEMENT  10/ 2001   CYSTOSCOPY/URETEROSCOPY/HOLMIUM LASER/STENT PLACEMENT Left 03/30/2015   Procedure: LEFT URETEROSCOPY/HOLMIUM LASER LITHOTRIPSY/STENT PLACEMENT, WITH  STONE BASKET EXTRACTION;  Surgeon: Kathie Rhodes, MD;  Location: [place];  Service: Urology;  Laterality: Left;   HOLMIUM LASER APPLICATION Left 68/06/4194   Procedure: HOLMIUM LASER APPLICATION;  Surgeon: Kathie Rhodes, MD;  Location: [facility];  Service: Urology;  Laterality: Left;   IR URETERAL STENT LEFT NEW ACCESS W/O SEP NEPHROSTOMY CATH  05/17/2019   LEFT  HEART CATH AND CORONARY ANGIOGRAPHY N/A 06/15/2020   Procedure: LEFT HEART CATH AND CORONARY ANGIOGRAPHY;  Surgeon: Dixie Dials, MD;  Location: [place] [facility];  Service: Cardiovascular;  Laterality: N/A;   NEPHROLITHOTOMY Left 03/20/2015   Procedure: NEPHROLITHOTOMY PERCUTANEOUS;  Surgeon: Kathie Rhodes, MD;  Location: [facility];  Service: Urology;  Laterality: Left;   NEPHROLITHOTOMY Left 05/17/2019   Procedure: NEPHROLITHOTOMY PERCUTANEOUS;  Surgeon: Kathie Rhodes, MD;  Location: [facility];  Service: Urology;  Laterality: Left;   TONSILLECTOMY     as child   UVULOPALATOPHARYNGOPLASTY  1996   nasal surgery       Home Medications    Prior to Admission medications   Medication Sig Start Date End Date Taking? Authorizing Provider  aspirin EC 81 MG tablet Take 81 mg by mouth daily. Swallow whole.    [provider]  COVID-19 mRNA bivalent vaccine, Pfizer, (PFIZER COVID-19 VAC BIVALENT) injection Inject into the muscle. 05/06/21   Carlyle Basques, MD  Fluticasone-Umeclidin-Vilant (TRELEGY ELLIPTA) 200-62.5-25 MCG/ACT AEPB Inhale 1 puff into the lungs daily. 07/26/21   Hunsucker, Bonna Gains, MD  levalbuterol [facility] HFA) 45 MCG/ACT inhaler Inhale 1 puff into the lungs every 6 (six) hours as needed for wheezing. 07/26/21   Hunsucker, Bonna Gains, MD  losartan (COZAAR) 25 MG tablet Take 1 tablet (25 mg total)  by mouth daily. Patient not taking: Reported on 07/26/2021 03/26/21     nitroGLYCERIN (NITROSTAT) 0.4 MG SL tablet Place 0.4 mg under the tongue every 5 (five) minutes as needed for chest pain. Patient not taking: Reported on 07/26/2021    [provider]  pantoprazole (PROTONIX) 40 MG tablet Take 1 tablet (40 mg total) by mouth daily. 07/12/21   Hunsucker, Bonna Gains, MD  predniSONE (DELTASONE) 20 MG tablet Take 1 tablet (20 mg total) by mouth daily for 5 days. 11/04/21 11/09/21  Teodora Medici, FNP  silodosin (RAPAFLO) 8 MG CAPS capsule Take 1 capsule (8 mg total) by mouth daily.  07/24/21     tamsulosin (FLOMAX) 0.4 MG CAPS capsule Take 1 capsule (0.4 mg total) by mouth daily. Patient not taking: Reported on 07/26/2021 07/15/21     Tetrahydrozoline HCl (VISINE OP) Place 1 drop into both eyes daily as needed (allergies).    [provider]  albuterol (VENTOLIN HFA) 108 (90 Base) MCG/ACT inhaler Inhale 2 puffs into the lungs every 4 (four) hours as needed for wheezing or shortness of breath. 04/12/20 06/12/20  Montine Circle, PA-C  budesonide-formoterol (SYMBICORT) 80-4.5 MCG/ACT inhaler Inhale 2 puffs into the lungs in the morning and at bedtime. 06/01/20 06/12/20  Palumbo, April, MD  fluticasone (FLONASE) 50 MCG/ACT nasal spray Place 2 sprays into both nostrils daily. 10/12/20 10/25/21  [name], NP  furosemide (LASIX) 20 MG tablet Take 1 tablet (20 mg total) by mouth daily. 06/01/20 06/12/20  Palumbo, April, MD    Family History Family History  Problem Relation Age of Onset   Cancer Mother    Heart disease Father    Colon polyps Father    Cancer Other    Obesity Other    Colon cancer Neg Hx    Esophageal cancer Neg Hx    Rectal cancer Neg Hx    Stomach cancer Neg Hx     Social History Social History   Tobacco Use   Smoking status: Former    Packs/day: 1.00    Years: 20.00    Total pack years: 20.00    Types: Cigarettes    Quit date: 03/27/1984    Years since quitting: 37.6   Smokeless tobacco: Never  Substance Use Topics   Alcohol use: No   Drug use: No     Allergies   Patient has no known allergies.   Review of Systems Review of Systems Per HPI  Physical Exam Triage Vital Signs ED Triage Vitals  Enc Vitals Group     BP 11/04/21 1851 139/74     Pulse Rate 11/04/21 1851 64     Resp 11/04/21 1851 18     Temp 11/04/21 1851 99 F (37.2 C)     Temp Source 11/04/21 1851 Oral     SpO2 11/04/21 1851 94 %     Weight --      Height --      Head Circumference --      Peak Flow --      Pain Score 11/04/21 1850 3     Pain Loc --       Pain Edu? --      Excl. in [place]? --    No data found.  Updated Vital Signs BP 139/74   Pulse 64   Temp 99 F (37.2 C) (Oral)   Resp 18   SpO2 94%   Visual Acuity Right Eye Distance:   Left Eye Distance:   Bilateral Distance:  Right Eye Near:   Left Eye Near:    Bilateral Near:     Physical Exam Constitutional:      General: He is not in acute distress.    Appearance: Normal appearance. He is not toxic-appearing or diaphoretic.  HENT:     Head: Normocephalic and atraumatic.  Eyes:     Extraocular Movements: Extraocular movements intact.     Conjunctiva/sclera: Conjunctivae normal.  Pulmonary:     Effort: Pulmonary effort is normal.  Feet:     Comments: Mild swelling and erythema located to left MTP joint.  No warmth, lacerations, abrasions noted.  Patient has full range of motion of toes.  Neurovascular intact. Neurological:     General: No focal deficit present.     Mental Status: He is alert and oriented to person, place, and time. Mental status is at baseline.  Psychiatric:        Mood and Affect: Mood normal.        Behavior: Behavior normal.        Thought Content: Thought content normal.        Judgment: Judgment normal.      UC Treatments / Results  Labs (all labs ordered are listed, but only abnormal results are displayed) Labs Reviewed - No data to display  EKG   Radiology No results found.  Procedures Procedures (including critical care time)  Medications Ordered in UC Medications - No data to display  Initial Impression / Assessment and Plan / UC Course  I have reviewed the triage vital signs and the nursing notes.  Pertinent labs & imaging results that were available during my care of the patient were reviewed by me and considered in my medical decision making (see chart for details).     Highly suspicious of gout given physical exam.  Do not think imaging is necessary given no obvious injury.  Will treat with prednisone at low-dose  and short course as patient states that he has taken this before and tolerated well.  Patient to follow-up with podiatry tomorrow for further evaluation and management. No concern for septic joint.  Discussed return precautions.  Patient verbalized understanding and was agreeable with plan. Final Clinical Impressions(s) / UC Diagnoses   Final diagnoses:  Pain in left toe(s)     Discharge Instructions      I am suspicious that you may have gout so you are being treated with prednisone steroid.  Please follow-up with podiatry for further evaluation and management.    ED Prescriptions     Medication Sig Dispense Auth. Provider   predniSONE (DELTASONE) 20 MG tablet Take 1 tablet (20 mg total) by mouth daily for 5 days. 5 tablet [place], Michele Rockers, [place]      PDMP not reviewed this encounter.   Teodora Medici, [place] 11/04/21 [address],  11/04/21 2004

## 2021-11-05 ENCOUNTER — Other Ambulatory Visit (HOSPITAL_COMMUNITY): Payer: Self-pay

## 2021-11-08 ENCOUNTER — Other Ambulatory Visit (HOSPITAL_COMMUNITY): Payer: Self-pay

## 2022-01-14 ENCOUNTER — Other Ambulatory Visit (HOSPITAL_COMMUNITY): Payer: Self-pay

## 2022-02-20 ENCOUNTER — Other Ambulatory Visit (HOSPITAL_BASED_OUTPATIENT_CLINIC_OR_DEPARTMENT_OTHER): Payer: Self-pay

## 2022-02-20 MED ORDER — FLUAD QUADRIVALENT 0.5 ML IM PRSY
PREFILLED_SYRINGE | INTRAMUSCULAR | 0 refills | Status: DC
  Filled 2022-02-20: qty 0.5, 1d supply, fill #0

## 2022-02-20 MED ORDER — INFLUENZA VAC SPLIT QUAD 0.5 ML IM SUSY
PREFILLED_SYRINGE | INTRAMUSCULAR | 0 refills | Status: DC
  Filled 2022-02-20: qty 0.5, 1d supply, fill #0

## 2022-02-25 ENCOUNTER — Other Ambulatory Visit (HOSPITAL_COMMUNITY): Payer: Self-pay

## 2022-04-01 ENCOUNTER — Ambulatory Visit
Admission: EM | Admit: 2022-04-01 | Discharge: 2022-04-01 | Disposition: A | Discharge status: Home / Self Care | Payer: 59 | Attending: Physician Assistant | Admitting: Physician Assistant

## 2022-04-01 ENCOUNTER — Other Ambulatory Visit (HOSPITAL_COMMUNITY): Payer: Self-pay

## 2022-04-01 DIAGNOSIS — N3000 Acute cystitis without hematuria: Secondary | ICD-10-CM | POA: Insufficient documentation

## 2022-04-01 LAB — POCT URINALYSIS DIP (MANUAL ENTRY)
Bilirubin, UA: NEGATIVE
Glucose, UA: NEGATIVE mg/dL
Ketones, POC UA: NEGATIVE mg/dL
Nitrite, UA: NEGATIVE
Protein Ur, POC: NEGATIVE mg/dL
Spec Grav, UA: 1.02 (ref 1.010–1.025)
Urobilinogen, UA: 0.2 E.U./dL
pH, UA: 5.5 (ref 5.0–8.0)

## 2022-04-01 MED ORDER — CIPROFLOXACIN HCL 500 MG PO TABS
500.0000 mg | ORAL_TABLET | Freq: Two times a day (BID) | ORAL | 0 refills | Status: DC
  Filled 2022-04-01: qty 10, 5d supply, fill #0

## 2022-04-01 NOTE — ED Provider Notes (Signed)
[facility]    CSN: [id number] Arrival date & time: 04/01/22  1059      History   Chief Complaint Chief Complaint  Patient presents with   UTI    HPI Zachary Jacobs is a 66 y.o. male.   Patient here today for evaluation of an dysuria that is been ongoing for 2 weeks.  He states yesterday burning was more significant.  He has not had any fever.  He denies any abdominal pain or back pain.  He does have history of kidney stones but states that currently he is not having any pain similar to prior kidney stones.  He denies any nausea or vomiting.  He does not report treatment for symptoms.  The history is provided by the patient.    Past Medical History:  Diagnosis Date   Allergy    Anemia    as child   COPD, mild ([place])    very mild   History of kidney stones    OSA (obstructive sleep apnea)    s/p  surgery 1996   Pneumonia    Pre-diabetes    Renal calculus, left    Renal cyst, right    SIMPLE   Sleep apnea     cpap   dont wear latley    Patient Active Problem List   Diagnosis Date Noted   Prediabetes 07/12/2020   Unstable angina ([place]) 06/15/2020   Abnormal electrocardiogram during exercise stress test 06/15/2020   Lower urinary tract symptoms (LUTS) 03/13/2017   History of kidney stones 03/13/2017   Staghorn renal calculus 03/20/2015   Hydronephrosis     Past Surgical History:  Procedure Laterality Date   CYSTO/  RIGHT URETEROSCOPY/  STENT PLACEMENT  10/ 2001   CYSTOSCOPY/URETEROSCOPY/HOLMIUM LASER/STENT PLACEMENT Left 03/30/2015   Procedure: LEFT URETEROSCOPY/HOLMIUM LASER LITHOTRIPSY/STENT PLACEMENT, WITH  STONE BASKET EXTRACTION;  Surgeon: Kathie Rhodes, MD;  Location: [place];  Service: Urology;  Laterality: Left;   HOLMIUM LASER APPLICATION Left 15/10/2618   Procedure: HOLMIUM LASER APPLICATION;  Surgeon: Kathie Rhodes, MD;  Location: [facility];  Service: Urology;  Laterality: Left;   IR URETERAL STENT LEFT  NEW ACCESS W/O SEP NEPHROSTOMY CATH  05/17/2019   LEFT HEART CATH AND CORONARY ANGIOGRAPHY N/A 06/15/2020   Procedure: LEFT HEART CATH AND CORONARY ANGIOGRAPHY;  Surgeon: Dixie Dials, MD;  Location: [place] [facility];  Service: Cardiovascular;  Laterality: N/A;   NEPHROLITHOTOMY Left 03/20/2015   Procedure: NEPHROLITHOTOMY PERCUTANEOUS;  Surgeon: Kathie Rhodes, MD;  [facility];  Service: Urology;  Laterality: Left;   NEPHROLITHOTOMY Left 05/17/2019   Procedure: NEPHROLITHOTOMY PERCUTANEOUS;  Surgeon: Kathie Rhodes, MD;  Location[facility];  Service: Urology;  Laterality: Left;   TONSILLECTOMY     as child   UVULOPALATOPHARYNGOPLASTY  1996   nasal surgery       Home Medications    Prior to Admission medications   Medication Sig Start Date End Date Taking? Authorizing Provider  ciprofloxacin (CIPRO) 500 MG tablet Take 1 tablet (500 mg total) by mouth every 12 (twelve) hours. 04/01/22  Yes Francene Finders, PA-C  aspirin EC 81 MG tablet Take 81 mg by mouth daily. Swallow whole.    [provider]  COVID-19 mRNA bivalent vaccine, Pfizer, (PFIZER COVID-19 VAC BIVALENT) injection Inject into the muscle. 05/06/21   Carlyle Basques, MD  Fluticasone-Umeclidin-Vilant (TRELEGY ELLIPTA) 200-62.5-25 MCG/ACT AEPB Inhale 1 puff into the lungs daily. 07/26/21   Hunsucker, Bonna Gains, MD  influenza  vac split quadrivalent PF (FLUARIX) 0.5 ML injection Inject into the muscle. 02/20/22   Carlyle Basques, MD  influenza vaccine adjuvanted (FLUAD QUADRIVALENT) 0.5 ML injection Inject into the muscle. 02/20/22   Carlyle Basques, MD  levalbuterol [facility] HFA) 45 MCG/ACT inhaler Inhale 1 puff into the lungs every 6 (six) hours as needed for wheezing. 07/26/21   Hunsucker, Bonna Gains, MD  losartan (COZAAR) 25 MG tablet Take 1 tablet (25 mg total) by mouth daily. Patient not taking: Reported on 07/26/2021 03/26/21     nitroGLYCERIN (NITROSTAT) 0.4 MG SL tablet Place 0.4 mg under the tongue every 5 (five)  minutes as needed for chest pain. Patient not taking: Reported on 07/26/2021    [provider]  pantoprazole (PROTONIX) 40 MG tablet Take 1 tablet (40 mg total) by mouth daily. 07/12/21   Hunsucker, Bonna Gains, MD  silodosin (RAPAFLO) 8 MG CAPS capsule Take 1 capsule (8 mg total) by mouth daily. 07/24/21     tamsulosin (FLOMAX) 0.4 MG CAPS capsule Take 1 capsule (0.4 mg total) by mouth daily. Patient not taking: Reported on 07/26/2021 07/15/21     Tetrahydrozoline HCl (VISINE OP) Place 1 drop into both eyes daily as needed (allergies).    [provider]  albuterol (VENTOLIN HFA) 108 (90 Base) MCG/ACT inhaler Inhale 2 puffs into the lungs every 4 (four) hours as needed for wheezing or shortness of breath. 04/12/20 06/12/20  Montine Circle, PA-C  budesonide-formoterol (SYMBICORT) 80-4.5 MCG/ACT inhaler Inhale 2 puffs into the lungs in the morning and at bedtime. 06/01/20 06/12/20  Palumbo, April, MD  fluticasone (FLONASE) 50 MCG/ACT nasal spray Place 2 sprays into both nostrils daily. 10/12/20 10/25/21  [name], NP  furosemide (LASIX) 20 MG tablet Take 1 tablet (20 mg total) by mouth daily. 06/01/20 06/12/20  Palumbo, April, MD    Family History Family History  Problem Relation Age of Onset   Cancer Mother    Heart disease Father    Colon polyps Father    Cancer Other    Obesity Other    Colon cancer Neg Hx    Esophageal cancer Neg Hx    Rectal cancer Neg Hx    Stomach cancer Neg Hx     Social History Social History   Tobacco Use   Smoking status: Former    Packs/day: 1.00    Years: 20.00    Total pack years: 20.00    Types: Cigarettes    Quit date: 03/27/1984    Years since quitting: 38.0   Smokeless tobacco: Never  Substance Use Topics   Alcohol use: No   Drug use: No     Allergies   Patient has no known allergies.   Review of Systems Review of Systems  Constitutional:  Negative for chills and fever.  Eyes:  Negative for discharge and redness.   Gastrointestinal:  Negative for abdominal pain, nausea and vomiting.  Genitourinary:  Positive for dysuria. Negative for flank pain.  Musculoskeletal:  Negative for back pain.  Neurological:  Negative for numbness.     Physical Exam Triage Vital Signs ED Triage Vitals  Enc Vitals Group     BP 04/01/22 1310 124/83     Pulse Rate 04/01/22 1310 (!) 56     Resp 04/01/22 1310 18     Temp 04/01/22 1310 98.1 F (36.7 C)     Temp Source 04/01/22 1310 Oral     SpO2 04/01/22 1310 98 %     Weight --  Height --      Head Circumference --      Peak Flow --      Pain Score 04/01/22 1309 6     Pain Loc --      Pain Edu? --      Excl. in [place]? --    No data found.  Updated Vital Signs BP 124/83 (BP Location: Left Arm)   Pulse (!) 56   Temp 98.1 F (36.7 C) (Oral)   Resp 18   SpO2 98%      Physical Exam Vitals and nursing note reviewed.  Constitutional:      General: He is not in acute distress.    Appearance: Normal appearance. He is not ill-appearing.  HENT:     Head: Normocephalic and atraumatic.  Eyes:     Conjunctiva/sclera: Conjunctivae normal.  Cardiovascular:     Rate and Rhythm: Normal rate and regular rhythm.  Pulmonary:     Effort: Pulmonary effort is normal.     Breath sounds: Normal breath sounds. No wheezing, rhonchi or rales.  Neurological:     Mental Status: He is alert.  Psychiatric:        Mood and Affect: Mood normal.        Behavior: Behavior normal.        Thought Content: Thought content normal.      UC Treatments / Results  Labs (all labs ordered are listed, but only abnormal results are displayed) Labs Reviewed  POCT URINALYSIS DIP (MANUAL ENTRY) - Abnormal; Notable for the following components:      Result Value   Clarity, UA cloudy (*)    Blood, UA trace-intact (*)    Leukocytes, UA Small (1+) (*)    All other components within normal limits  URINE CULTURE    EKG   Radiology No results found.  Procedures Procedures  (including critical care time)  Medications Ordered in UC Medications - No data to display  Initial Impression / Assessment and Plan / UC Course  I have reviewed the triage vital signs and the nursing notes.  Pertinent labs & imaging results that were available during my care of the patient were reviewed by me and considered in my medical decision making (see chart for details).    Cipro prescribed for suspected UTI. Will order culture. Recommended follow up in ED if symptoms worsen or with any further concerns for kidney stone. Patient expresses understanding.   Final Clinical Impressions(s) / UC Diagnoses   Final diagnoses:  Acute cystitis without hematuria   Discharge Instructions   None    ED Prescriptions     Medication Sig Dispense Auth. Provider   ciprofloxacin (CIPRO) 500 MG tablet Take 1 tablet (500 mg total) by mouth every 12 (twelve) hours. 10 tablet Francene Finders, PA-C      PDMP not reviewed this encounter.   Francene Finders, PA-C 04/01/22 1343

## 2022-04-01 NOTE — ED Triage Notes (Signed)
Pt presents with intermittent burning during urination X 2 weeks.

## 2022-04-02 LAB — URINE CULTURE: Culture: NO GROWTH

## 2022-05-10 ENCOUNTER — Telehealth: Payer: Commercial Managed Care - PPO | Admitting: Nurse Practitioner

## 2022-05-10 DIAGNOSIS — U071 COVID-19: Secondary | ICD-10-CM

## 2022-05-10 DIAGNOSIS — J069 Acute upper respiratory infection, unspecified: Secondary | ICD-10-CM

## 2022-05-10 DIAGNOSIS — J45909 Unspecified asthma, uncomplicated: Secondary | ICD-10-CM | POA: Insufficient documentation

## 2022-05-10 MED ORDER — LEVALBUTEROL TARTRATE 45 MCG/ACT IN AERO: 1.0000 | INHALATION_SPRAY | Freq: Four times a day (QID) | RESPIRATORY_TRACT | 11 refills | Status: AC | PRN

## 2022-05-10 MED ORDER — MOLNUPIRAVIR EUA 200MG CAPSULE: 4.0000 | ORAL_CAPSULE | Freq: Two times a day (BID) | ORAL | 0 refills | Status: AC

## 2022-05-10 NOTE — Progress Notes (Signed)
Virtual Visit Consent   Zachary Jacobs, you are scheduled for a virtual visit with a [facility] provider today. Just as with appointments in the office, your consent must be obtained to participate. Your consent will be active for this visit and any virtual visit you may have with one of our providers in the next 365 days. If you have a MyChart account, a copy of this consent can be sent to you electronically.  As this is a virtual visit, video technology does not allow for your provider to perform a traditional examination. This may limit your provider's ability to fully assess your condition. If your provider identifies any concerns that need to be evaluated in person or the need to arrange testing (such as labs, EKG, etc.), we will make arrangements to do so. Although advances in technology are sophisticated, we cannot ensure that it will always work on either your end or our end. If the connection with a video visit is poor, the visit may have to be switched to a telephone visit. With either a video or telephone visit, we are not always able to ensure that we have a secure connection.  By engaging in this virtual visit, you consent to the provision of healthcare and authorize for your insurance to be billed (if applicable) for the services provided during this visit. Depending on your insurance coverage, you may receive a charge related to this service.  I need to obtain your verbal consent now. Are you willing to proceed with your visit today? Zachary Jacobs has provided verbal consent on 05/10/2022 for a virtual visit (video or telephone). Apolonio Schneiders, FNP  Date: 05/10/2022 12:34 PM  Virtual Visit via Video Note   I, Apolonio Schneiders, connected with  Zachary Jacobs  ([id number], Mar 15, 1956) on 05/10/22 at  1:15 PM EST by a video-enabled telemedicine application and verified that I am speaking with the correct person using two identifiers.  Location: Patient: Virtual Visit Location  Patient: Home Provider: Virtual Visit Location Provider: Home Office   I discussed the limitations of evaluation and management by telemedicine and the availability of in person appointments. The patient expressed understanding and agreed to proceed.    History of Present Illness: Zachary Jacobs is a 67 y.o. who identifies as a male who was assigned male at birth, and is being seen today for sinus congestion with acute onset today. Sinus congestion, headache and runny nose No chest congestion He denies SOB    He has not been sick in awhile and feels very bad No fever at this time   He has had COVID in the past without complications  He was treated with an anti-viral   He was able to take a home COVID test and it is positive   Problems:  Patient Active Problem List   Diagnosis Date Noted   Asthma 05/10/2022   Prediabetes 07/12/2020   Unstable angina ([place]) 06/15/2020   Abnormal electrocardiogram during exercise stress test 06/15/2020   Incomplete left bundle branch block 05/31/2020   Lower urinary tract symptoms (LUTS) 03/13/2017   History of kidney stones 03/13/2017   Staghorn renal calculus 03/20/2015   Hydronephrosis     Allergies: No Known Allergies Medications:  Current Outpatient Medications:    aspirin EC 81 MG tablet, Take 81 mg by mouth daily. Swallow whole., Disp: , Rfl:    ciprofloxacin (CIPRO) 500 MG tablet, Take 1 tablet (500 mg total) by mouth every 12 (twelve) hours., Disp: 10 tablet, Rfl:  0   Fluticasone-Umeclidin-Vilant (TRELEGY ELLIPTA) 200-62.5-25 MCG/ACT AEPB, Inhale 1 puff into the lungs daily., Disp: 60 each, Rfl: 11   influenza vac split quadrivalent PF (FLUARIX) 0.5 ML injection, Inject into the muscle., Disp: 0.5 mL, Rfl: 0   influenza vaccine adjuvanted (FLUAD QUADRIVALENT) 0.5 ML injection, Inject into the muscle., Disp: 0.5 mL, Rfl: 0   levalbuterol (XOPENEX HFA) 45 MCG/ACT inhaler, Inhale 1 puff into the lungs every 6 (six) hours as needed for  wheezing., Disp: 15 g, Rfl: 11   losartan (COZAAR) 25 MG tablet, Take 1 tablet (25 mg total) by mouth daily. (Patient not taking: Reported on 07/26/2021), Disp: 90 tablet, Rfl: 3   nitroGLYCERIN (NITROSTAT) 0.4 MG SL tablet, Place 0.4 mg under the tongue every 5 (five) minutes as needed for chest pain. (Patient not taking: Reported on 07/26/2021), Disp: , Rfl:    pantoprazole (PROTONIX) 40 MG tablet, Take 1 tablet (40 mg total) by mouth daily., Disp: 30 tablet, Rfl: 11   silodosin (RAPAFLO) 8 MG CAPS capsule, Take 1 capsule (8 mg total) by mouth daily., Disp: 90 capsule, Rfl: 3   Tetrahydrozoline HCl (VISINE OP), Place 1 drop into both eyes daily as needed (allergies)., Disp: , Rfl:   Observations/Objective: Patient is well-developed, well-nourished in no acute distress.  Resting comfortably  at home.  Head is normocephalic, atraumatic.  No labored breathing.  Speech is clear and coherent with logical content.  Patient is alert and oriented at baseline.    Assessment and Plan: 1. Viral upper respiratory tract infection 2. COVID-19  - molnupiravir EUA (LAGEVRIO) 200 mg CAPS capsule; Take 4 capsules (800 mg total) by mouth 2 (two) times daily for 5 days.  Dispense: 40 capsule; Refill: 0 - levalbuterol (XOPENEX HFA) 45 MCG/ACT inhaler; Inhale 1 puff into the lungs every 6 (six) hours as needed for wheezing.  Dispense: 15 g; Refill: 11     Follow Up Instructions: I discussed the assessment and treatment plan with the patient. The patient was provided an opportunity to ask questions and all were answered. The patient agreed with the plan and demonstrated an understanding of the instructions.  A copy of instructions were sent to the patient via MyChart unless otherwise noted below.    The patient was advised to call back or seek an in-person evaluation if the symptoms worsen or if the condition fails to improve as anticipated.  Time:  I spent 10 minutes with the patient via telehealth  technology discussing the above problems/concerns.    Apolonio Schneiders, FNP

## 2022-05-15 ENCOUNTER — Other Ambulatory Visit: Payer: Self-pay

## 2022-05-16 ENCOUNTER — Other Ambulatory Visit (HOSPITAL_COMMUNITY): Payer: Self-pay

## 2022-05-16 ENCOUNTER — Other Ambulatory Visit: Payer: Self-pay

## 2022-05-20 ENCOUNTER — Other Ambulatory Visit (HOSPITAL_COMMUNITY): Payer: Self-pay

## 2022-05-29 ENCOUNTER — Ambulatory Visit (INDEPENDENT_AMBULATORY_CARE_PROVIDER_SITE_OTHER): Payer: Commercial Managed Care - PPO

## 2022-05-29 ENCOUNTER — Other Ambulatory Visit (HOSPITAL_COMMUNITY): Payer: Self-pay

## 2022-05-29 ENCOUNTER — Ambulatory Visit
Admission: EM | Admit: 2022-05-29 | Discharge: 2022-05-29 | Disposition: A | Discharge status: Home / Self Care | Payer: Commercial Managed Care - PPO | Attending: Physician Assistant | Admitting: Physician Assistant

## 2022-05-29 DIAGNOSIS — M25572 Pain in left ankle and joints of left foot: Secondary | ICD-10-CM

## 2022-05-29 DIAGNOSIS — M10272 Drug-induced gout, left ankle and foot: Secondary | ICD-10-CM | POA: Diagnosis not present

## 2022-05-29 DIAGNOSIS — M19072 Primary osteoarthritis, left ankle and foot: Secondary | ICD-10-CM | POA: Diagnosis not present

## 2022-05-29 DIAGNOSIS — M7732 Calcaneal spur, left foot: Secondary | ICD-10-CM | POA: Diagnosis not present

## 2022-05-29 DIAGNOSIS — M7989 Other specified soft tissue disorders: Secondary | ICD-10-CM | POA: Diagnosis not present

## 2022-05-29 MED ORDER — IBUPROFEN 600 MG PO TABS
600.0000 mg | ORAL_TABLET | Freq: Four times a day (QID) | ORAL | 0 refills | Status: DC | PRN
  Filled 2022-05-29: qty 30, 8d supply, fill #0

## 2022-05-29 MED ORDER — COLCHICINE 0.6 MG PO TABS
ORAL_TABLET | ORAL | 0 refills | Status: DC
  Filled 2022-05-29: qty 3, 1d supply, fill #0

## 2022-05-29 MED ORDER — PREDNISONE 10 MG PO TABS
10.0000 mg | ORAL_TABLET | Freq: Three times a day (TID) | ORAL | 0 refills | Status: DC
  Filled 2022-05-29: qty 15, 5d supply, fill #0

## 2022-05-29 NOTE — ED Triage Notes (Signed)
Pt states left ankle pain and swelling for the past week. Denies any injury.  States he has been taking  ibuprofen at home intermittently.

## 2022-05-29 NOTE — Discharge Instructions (Addendum)
Advised to take the ibuprofen 600 mg every 6 hours with food on a regular basis to decrease pain and swelling. Advised take prednisone 10 mg 3 times a day for 5 days only to help reduce pain and swelling. Advised take Colchinine in the 0.6 mg, 2 tablets initially and then take the last tablet 1 hour later.  Advised to use alternating heat and ice to help reduce swelling and pain.  Advised to follow-up PCP or return to urgent care if symptoms fail to improve.

## 2022-05-29 NOTE — ED Provider Notes (Signed)
[facility]    CSN: [id number] Arrival date & time: 05/29/22  3267      History   Chief Complaint Chief Complaint  Patient presents with   Ankle Pain    HPI Zachary Jacobs is a 67 y.o. male.   67 year old male presents with left ankle pain and swelling.  Patient indicates that 1 week ago he woke up and had left ankle pain and swelling.  He indicates he does not recall an injury to the site.  He also indicates he did not traumatize it or twisted that he recalls.  Patient indicates that he has been taking some ibuprofen intermittently with minimal relief.  He indicates he has pain when he tries to stand and walk on the left ankle and foot.  He indicates that the pain is localized to the ankle and he has limited motion due to pain and discomfort.  Patient indicates he has been told he has gout in the past.  Patient is without fever or chills.   Ankle Pain   Past Medical History:  Diagnosis Date   Allergy    Anemia    as child   COPD, mild ([place])    very mild   History of kidney stones    OSA (obstructive sleep apnea)    s/p  surgery 1996   Pneumonia    Pre-diabetes    Renal calculus, left    Renal cyst, right    SIMPLE   Sleep apnea     cpap   dont wear latley    Patient Active Problem List   Diagnosis Date Noted   Asthma 05/10/2022   Prediabetes 07/12/2020   Unstable angina ([place]) 06/15/2020   Abnormal electrocardiogram during exercise stress test 06/15/2020   Incomplete left bundle branch block 05/31/2020   Lower urinary tract symptoms (LUTS) 03/13/2017   History of kidney stones 03/13/2017   Staghorn renal calculus 03/20/2015   Hydronephrosis     Past Surgical History:  Procedure Laterality Date   CYSTO/  RIGHT URETEROSCOPY/  STENT PLACEMENT  10/ 2001   CYSTOSCOPY/URETEROSCOPY/HOLMIUM LASER/STENT PLACEMENT Left 03/30/2015   Procedure: LEFT URETEROSCOPY/HOLMIUM LASER LITHOTRIPSY/STENT PLACEMENT, WITH  STONE BASKET EXTRACTION;  Surgeon: Kathie Rhodes, MD;  Location: [facility];  Service: Urology;  Laterality: Left;   HOLMIUM LASER APPLICATION Left 03/31/5808   Procedure: HOLMIUM LASER APPLICATION;  Surgeon: Kathie Rhodes, MD;  Location: [facility];  Service: Urology;  Laterality: Left;   IR URETERAL STENT LEFT NEW ACCESS W/O SEP NEPHROSTOMY CATH  05/17/2019   LEFT HEART CATH AND CORONARY ANGIOGRAPHY N/A 06/15/2020   Procedure: LEFT HEART CATH AND CORONARY ANGIOGRAPHY;  Surgeon: Dixie Dials, MD;  Location: [facility];  Service: Cardiovascular;  Laterality: N/A;   NEPHROLITHOTOMY Left 03/20/2015   Procedure: NEPHROLITHOTOMY PERCUTANEOUS;  Surgeon: Kathie Rhodes, MD;  Location: [facility];  Service: Urology;  Laterality: Left;   NEPHROLITHOTOMY Left 05/17/2019   Procedure: NEPHROLITHOTOMY PERCUTANEOUS;  Surgeon: Kathie Rhodes, MD;  Location: [facility];  Service: Urology;  Laterality: Left;   TONSILLECTOMY     as child   UVULOPALATOPHARYNGOPLASTY  1996   nasal surgery       Home Medications    Prior to Admission medications   Medication Sig Start Date End Date Taking? Authorizing Provider  colchicine 0.6 MG tablet Take 2 tablets initially and then 1 tablet 1 hour later. 05/29/22  Yes Nyoka Lint, PA-C  ibuprofen (ADVIL) 600 MG tablet Take 1 tablet (600  mg total) by mouth every 6 (six) hours as needed. 05/29/22  Yes Nyoka Lint, PA-C  predniSONE (DELTASONE) 10 MG tablet Take 1 tablet (10 mg total) by mouth three times a day, in the morning, at noon, and at bedtime for 5 days 05/29/22  Yes Nyoka Lint, PA-C  Fluticasone-Umeclidin-Vilant (TRELEGY ELLIPTA) 200-62.5-25 MCG/ACT AEPB Inhale 1 puff into the lungs daily. 07/26/21   Hunsucker, Bonna Gains, MD  influenza vac split quadrivalent PF (FLUARIX) 0.5 ML injection Inject into the muscle. 02/20/22   Carlyle Basques, MD  influenza vaccine adjuvanted (FLUAD QUADRIVALENT) 0.5 ML injection Inject into the muscle. 02/20/22   Carlyle Basques, MD  levalbuterol  [facility] HFA) 45 MCG/ACT inhaler Inhale 1 puff into the lungs every 6 (six) hours as needed for wheezing. 05/10/22   Apolonio Schneiders, FNP  losartan (COZAAR) 25 MG tablet Take 1 tablet (25 mg total) by mouth daily. Patient not taking: Reported on 07/26/2021 03/26/21     nitroGLYCERIN (NITROSTAT) 0.4 MG SL tablet Place 0.4 mg under the tongue every 5 (five) minutes as needed for chest pain. Patient not taking: Reported on 07/26/2021    [provider]  pantoprazole (PROTONIX) 40 MG tablet Take 1 tablet (40 mg total) by mouth daily. 07/12/21   Hunsucker, Bonna Gains, MD  silodosin (RAPAFLO) 8 MG CAPS capsule Take 1 capsule (8 mg total) by mouth daily. 07/24/21     Tetrahydrozoline HCl (VISINE OP) Place 1 drop into both eyes daily as needed (allergies).    [provider]  fluticasone (FLONASE) 50 MCG/ACT nasal spray Place 2 sprays into both nostrils daily. 10/12/20 10/25/21  [name], NP    Family History Family History  Problem Relation Age of Onset   Cancer Mother    Heart disease Father    Colon polyps Father    Cancer Other    Obesity Other    Colon cancer Neg Hx    Esophageal cancer Neg Hx    Rectal cancer Neg Hx    Stomach cancer Neg Hx     Social History Social History   Tobacco Use   Smoking status: Former    Packs/day: 1.00    Years: 20.00    Total pack years: 20.00    Types: Cigarettes    Quit date: 03/27/1984    Years since quitting: 38.1   Smokeless tobacco: Never  Substance Use Topics   Alcohol use: No   Drug use: No     Allergies   Patient has no known allergies.   Review of Systems Review of Systems  Musculoskeletal:  Positive for joint swelling (left ankle swelling).     Physical Exam Triage Vital Signs ED Triage Vitals [05/29/22 0844]  Enc Vitals Group     BP (!) 141/87     Pulse Rate 62     Resp 16     Temp 98.2 F (36.8 C)     Temp Source Oral     SpO2 97 %     Weight      Height      Head Circumference      Peak Flow       Pain Score 5     Pain Loc      Pain Edu?      Excl. in [place]?    No data found.  Updated Vital Signs BP (!) 141/87 (BP Location: Right Arm)   Pulse 62   Temp 98.2 F (36.8 C) (Oral)   Resp 16  SpO2 97%   Visual Acuity Right Eye Distance:   Left Eye Distance:   Bilateral Distance:    Right Eye Near:   Left Eye Near:    Bilateral Near:     Physical Exam Constitutional:      Appearance: Normal appearance.  Musculoskeletal:       Feet:  Feet:     Comments: Left ankle: 1+ swelling at the ankle diffuse across the joint, pain on palpation, no unusual redness or bruising is present.  Range of motion is limited due to pain, stability is intact, no crepitus with motion. Neurological:     Mental Status: He is alert.      UC Treatments / Results  Labs (all labs ordered are listed, but only abnormal results are displayed) Labs Reviewed - No data to display  EKG   Radiology DG Ankle Complete Left  Result Date: 05/29/2022 CLINICAL DATA:  7 days of swelling and pain with locking. No known trauma. Question of gout. EXAM: LEFT ANKLE COMPLETE - 3+ VIEW COMPARISON:  Left foot radiographs 10/04/2021, left calcaneus radiographs 12/27/2013 FINDINGS: There is a chronic 10 mm well-corticated ossicle just distal to the medial malleolus, likely chronic/degenerative. Mild-to-moderate spurring at the adjacent medial talus. The ankle mortise is intact. Mild-to-moderate medial malleolar soft tissue swelling. Moderate-to-large plantar and posterior calcaneal heel spurs. Mild-to-moderate degenerative spurring at the lateral aspect of the cuboid at the articulation with the normal variant os peroneum. No acute fracture or dislocation. Mild-to-moderate vascular calcifications. IMPRESSION: 1. Mild-to-moderate medial malleolar soft tissue swelling. Medial malleolar degenerative ossicles, but no acute fracture is seen. 2. Moderate-to-large plantar and posterior calcaneal heel spurs. Electronically  Signed   By: Yvonne Kendall M.D.   On: 05/29/2022 09:16    Procedures Procedures (including critical care time)  Medications Ordered in UC Medications - No data to display  Initial Impression / Assessment and Plan / UC Course  I have reviewed the triage vital signs and the nursing notes.  Pertinent labs & imaging results that were available during my care of the patient were reviewed by me and considered in my medical decision making (see chart for details).    Plan: The diagnosis will be treated with the following: 1.  Gout of left ankle: A.  Prednisone 10 mg 3 times a day for 5 days only to treat inflammatory response. B.  Call Colchinine 0.6 mg, 2 tablets initially and then 1 tablet 1 hour later. C.  Follow low purine diet. 2.  Pain of left ankle: A.  Ibuprofen 600 mg every 6 hours with food to treat pain and discomfort. D.  Advised elevation alternating ice and heat therapy. 3.  Advised follow-up PCP or return to urgent care as needed. Final Clinical Impressions(s) / UC Diagnoses   Final diagnoses:  Acute left ankle pain  Acute drug-induced gout of left ankle     Discharge Instructions      Advised to take the ibuprofen 600 mg every 6 hours with food on a regular basis to decrease pain and swelling. Advised take prednisone 10 mg 3 times a day for 5 days only to help reduce pain and swelling. Advised take Colchinine in the 0.6 mg, 2 tablets initially and then take the last tablet 1 hour later.  Advised to use alternating heat and ice to help reduce swelling and pain.  Advised to follow-up PCP or return to urgent care if symptoms fail to improve.    ED Prescriptions     Medication  Sig Dispense Auth. Provider   ibuprofen (ADVIL) 600 MG tablet Take 1 tablet (600 mg total) by mouth every 6 (six) hours as needed. 30 tablet Nyoka Lint, PA-C   predniSONE (DELTASONE) 10 MG tablet Take 1 tablet (10 mg total) by mouth three times a day, in the morning, at noon, and at  bedtime for 5 days 15 tablet Nyoka Lint, PA-C   colchicine 0.6 MG tablet Take 2 tablets initially and then 1 tablet 1 hour later. 3 tablet Nyoka Lint, PA-C      PDMP not reviewed this encounter.   Nyoka Lint, PA-C 05/29/22 [phone]

## 2022-06-30 ENCOUNTER — Ambulatory Visit (HOSPITAL_BASED_OUTPATIENT_CLINIC_OR_DEPARTMENT_OTHER): Payer: Commercial Managed Care - PPO

## 2022-07-15 ENCOUNTER — Ambulatory Visit (HOSPITAL_BASED_OUTPATIENT_CLINIC_OR_DEPARTMENT_OTHER)
Admission: RE | Admit: 2022-07-15 | Discharge: 2022-07-15 | Disposition: A | Discharge status: Home / Self Care | Payer: Commercial Managed Care - PPO | Source: Ambulatory Visit | Attending: Pulmonary Disease | Admitting: Pulmonary Disease

## 2022-07-15 DIAGNOSIS — R911 Solitary pulmonary nodule: Secondary | ICD-10-CM

## 2022-07-15 DIAGNOSIS — R918 Other nonspecific abnormal finding of lung field: Secondary | ICD-10-CM | POA: Diagnosis not present

## 2022-08-05 NOTE — Progress Notes (Signed)
Lung nodule stable, this is great news.  No further imaging needed.  Can we schedule follow-up with patient, he seems lost to follow-up.  Planned follow-up about 6 months ago.  Thank you.

## 2022-08-06 ENCOUNTER — Encounter: Payer: Self-pay | Admitting: Pulmonary Disease

## 2022-08-06 ENCOUNTER — Ambulatory Visit: Payer: Commercial Managed Care - PPO | Admitting: Pulmonary Disease

## 2022-08-06 VITALS — BP 124/68 | HR 65 | Wt 261.2 lb

## 2022-08-06 DIAGNOSIS — R0609 Other forms of dyspnea: Secondary | ICD-10-CM

## 2022-08-06 NOTE — Patient Instructions (Addendum)
Nice to see you again  I think it is likely that the little bit of change in your breathing for the worse over the last few days is likely the pollen emergence and may be irritation of the airways.  Continue Trelegy  May use Xopenex a couple times during the day when you feel this way, let me know if this helps you feel better, if the Xopenex is helpful then it is likely that asthma is the issue with your breathing over the last few days  Will get pulmonary function test in the coming weeks just to make sure this limit of shortness of breath is not anything more concerning.  We can discuss results over MyChart or the phone  The CT scan shows stable nodules, this is great news, nothing acute to worry about anymore.  Return to clinic in 6 months or sooner as needed with Dr. Judeth Horn
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
## 2022-08-07 ENCOUNTER — Encounter: Payer: Self-pay | Admitting: Pulmonary Disease

## 2022-08-08 ENCOUNTER — Other Ambulatory Visit (HOSPITAL_COMMUNITY): Payer: Self-pay

## 2022-08-08 DIAGNOSIS — N130 Hydronephrosis with ureteropelvic junction obstruction: Secondary | ICD-10-CM | POA: Diagnosis not present

## 2022-08-08 DIAGNOSIS — N3000 Acute cystitis without hematuria: Secondary | ICD-10-CM | POA: Diagnosis not present

## 2022-08-08 DIAGNOSIS — N401 Enlarged prostate with lower urinary tract symptoms: Secondary | ICD-10-CM | POA: Diagnosis not present

## 2022-08-08 DIAGNOSIS — R3914 Feeling of incomplete bladder emptying: Secondary | ICD-10-CM | POA: Diagnosis not present

## 2022-08-08 MED ORDER — CEPHALEXIN 500 MG PO CAPS
500.0000 mg | ORAL_CAPSULE | Freq: Two times a day (BID) | ORAL | 0 refills | Status: DC
  Filled 2022-08-08: qty 14, 7d supply, fill #0

## 2022-08-08 MED ORDER — SILODOSIN 8 MG PO CAPS
8.0000 mg | ORAL_CAPSULE | Freq: Every day | ORAL | 3 refills | Status: DC
  Filled 2022-08-08: qty 90, 90d supply, fill #0
  Filled 2022-12-05 – 2023-01-10 (×2): qty 90, 90d supply, fill #1
  Filled 2023-04-15: qty 90, 90d supply, fill #2
  Filled 2023-07-31: qty 90, 90d supply, fill #3

## 2022-08-11 DIAGNOSIS — E876 Hypokalemia: Secondary | ICD-10-CM | POA: Diagnosis not present

## 2022-08-11 DIAGNOSIS — N429 Disorder of prostate, unspecified: Secondary | ICD-10-CM | POA: Diagnosis not present

## 2022-08-11 DIAGNOSIS — E034 Atrophy of thyroid (acquired): Secondary | ICD-10-CM | POA: Diagnosis not present

## 2022-08-11 DIAGNOSIS — E7849 Other hyperlipidemia: Secondary | ICD-10-CM | POA: Diagnosis not present

## 2022-08-11 DIAGNOSIS — E1165 Type 2 diabetes mellitus with hyperglycemia: Secondary | ICD-10-CM | POA: Diagnosis not present

## 2022-08-11 DIAGNOSIS — Z79899 Other long term (current) drug therapy: Secondary | ICD-10-CM | POA: Diagnosis not present

## 2022-08-11 DIAGNOSIS — D649 Anemia, unspecified: Secondary | ICD-10-CM | POA: Diagnosis not present

## 2022-08-12 ENCOUNTER — Other Ambulatory Visit: Payer: Self-pay | Admitting: Urology

## 2022-08-12 DIAGNOSIS — N1330 Unspecified hydronephrosis: Secondary | ICD-10-CM

## 2022-08-25 ENCOUNTER — Other Ambulatory Visit (HOSPITAL_COMMUNITY): Payer: Self-pay

## 2022-08-25 ENCOUNTER — Other Ambulatory Visit: Payer: Self-pay | Admitting: Pulmonary Disease

## 2022-08-25 MED ORDER — PANTOPRAZOLE SODIUM 40 MG PO TBEC
40.0000 mg | DELAYED_RELEASE_TABLET | Freq: Every day | ORAL | 11 refills | Status: DC
  Filled 2022-08-25: qty 30, 30d supply, fill #0
  Filled 2022-09-26: qty 30, 30d supply, fill #1
  Filled 2022-10-29: qty 30, 30d supply, fill #2
  Filled 2022-12-05: qty 30, 30d supply, fill #3
  Filled 2022-12-19: qty 90, 90d supply, fill #3
  Filled 2023-03-30: qty 90, 90d supply, fill #4
  Filled 2023-07-09: qty 90, 90d supply, fill #5

## 2022-09-05 ENCOUNTER — Ambulatory Visit
Admission: RE | Admit: 2022-09-05 | Discharge: 2022-09-05 | Disposition: A | Discharge status: Home / Self Care | Payer: Commercial Managed Care - PPO | Source: Ambulatory Visit | Attending: Urology | Admitting: Urology

## 2022-09-05 DIAGNOSIS — N1330 Unspecified hydronephrosis: Secondary | ICD-10-CM

## 2022-09-18 ENCOUNTER — Other Ambulatory Visit (HOSPITAL_COMMUNITY): Payer: Self-pay

## 2022-09-18 DIAGNOSIS — R3914 Feeling of incomplete bladder emptying: Secondary | ICD-10-CM | POA: Diagnosis not present

## 2022-09-18 DIAGNOSIS — R3912 Poor urinary stream: Secondary | ICD-10-CM | POA: Diagnosis not present

## 2022-09-18 DIAGNOSIS — N200 Calculus of kidney: Secondary | ICD-10-CM | POA: Diagnosis not present

## 2022-09-18 DIAGNOSIS — N401 Enlarged prostate with lower urinary tract symptoms: Secondary | ICD-10-CM | POA: Diagnosis not present

## 2022-09-18 MED ORDER — FINASTERIDE 5 MG PO TABS
5.0000 mg | ORAL_TABLET | Freq: Every day | ORAL | 3 refills | Status: DC
  Filled 2022-09-18: qty 90, 90d supply, fill #0
  Filled 2023-01-08: qty 90, 90d supply, fill #1
  Filled 2023-04-15: qty 90, 90d supply, fill #2
  Filled 2023-07-31: qty 90, 90d supply, fill #3

## 2022-09-19 ENCOUNTER — Ambulatory Visit (INDEPENDENT_AMBULATORY_CARE_PROVIDER_SITE_OTHER): Payer: Commercial Managed Care - PPO | Admitting: Pulmonary Disease

## 2022-09-19 DIAGNOSIS — R0609 Other forms of dyspnea: Secondary | ICD-10-CM | POA: Diagnosis not present

## 2022-09-19 LAB — PULMONARY FUNCTION TEST
DL/VA % pred: 116 %
DL/VA: 4.79 ml/min/mmHg/L
DLCO cor % pred: 80 %
DLCO cor: 21.29 ml/min/mmHg
DLCO unc % pred: 80 %
DLCO unc: 21.29 ml/min/mmHg
FEF 25-75 Post: 2.45 L/sec
FEF 25-75 Pre: 2.29 L/sec
FEF2575-%Change-Post: 6 %
FEF2575-%Pred-Post: 91 %
FEF2575-%Pred-Pre: 85 %
FEV1-%Change-Post: 2 %
FEV1-%Pred-Post: 71 %
FEV1-%Pred-Pre: 69 %
FEV1-Post: 2.43 L
FEV1-Pre: 2.37 L
FEV1FVC-%Change-Post: 2 %
FEV1FVC-%Pred-Pre: 106 %
FEV6-%Change-Post: 0 %
FEV6-%Pred-Post: 68 %
FEV6-%Pred-Pre: 68 %
FEV6-Post: 2.96 L
FEV6-Pre: 2.97 L
FEV6FVC-%Pred-Post: 105 %
FEV6FVC-%Pred-Pre: 105 %
FVC-%Change-Post: 0 %
FVC-%Pred-Post: 65 %
FVC-%Pred-Pre: 65 %
FVC-Post: 2.97 L
FVC-Pre: 2.98 L
Post FEV1/FVC ratio: 82 %
Post FEV6/FVC ratio: 100 %
Pre FEV1/FVC ratio: 80 %
Pre FEV6/FVC Ratio: 100 %
RV % pred: 94 %
RV: 2.24 L
TLC % pred: 75 %
TLC: 5.28 L

## 2022-09-19 NOTE — Progress Notes (Signed)
Pulmonary function test showed no evidence of COPD.  No improvement with administration of bronchodilator.  Lung volumes reveal mild restriction and some air trapping.  The ERV was severely low, mild restriction likely related to weight.  DLCO was within normal limits which argues against lung scarring or fibrosis as cause of restriction.  With the air trapping, we can see this with asthma.  This can cause shortness of breath.

## 2022-09-19 NOTE — Progress Notes (Signed)
Full PFT performed today. °

## 2022-09-19 NOTE — Patient Instructions (Signed)
Full PFT performed today. °

## 2022-10-29 ENCOUNTER — Other Ambulatory Visit (HOSPITAL_COMMUNITY): Payer: Self-pay

## 2022-10-29 ENCOUNTER — Other Ambulatory Visit: Payer: Self-pay

## 2022-11-19 ENCOUNTER — Other Ambulatory Visit (HOSPITAL_COMMUNITY): Payer: Self-pay

## 2022-11-26 DIAGNOSIS — R072 Precordial pain: Secondary | ICD-10-CM | POA: Diagnosis not present

## 2022-11-26 DIAGNOSIS — E6609 Other obesity due to excess calories: Secondary | ICD-10-CM | POA: Diagnosis not present

## 2022-11-26 DIAGNOSIS — R0602 Shortness of breath: Secondary | ICD-10-CM | POA: Diagnosis not present

## 2022-11-26 DIAGNOSIS — J449 Chronic obstructive pulmonary disease, unspecified: Secondary | ICD-10-CM | POA: Diagnosis not present

## 2022-12-08 ENCOUNTER — Other Ambulatory Visit: Payer: Self-pay

## 2022-12-18 ENCOUNTER — Other Ambulatory Visit (HOSPITAL_COMMUNITY): Payer: Self-pay

## 2022-12-19 ENCOUNTER — Other Ambulatory Visit (HOSPITAL_COMMUNITY): Payer: Self-pay

## 2023-01-07 ENCOUNTER — Encounter: Payer: Self-pay | Admitting: *Deleted

## 2023-01-07 ENCOUNTER — Ambulatory Visit
Admission: EM | Admit: 2023-01-07 | Discharge: 2023-01-07 | Disposition: A | Discharge status: Home / Self Care | Payer: Commercial Managed Care - PPO | Attending: Internal Medicine | Admitting: Internal Medicine

## 2023-01-07 ENCOUNTER — Other Ambulatory Visit: Payer: Self-pay

## 2023-01-07 DIAGNOSIS — N3001 Acute cystitis with hematuria: Secondary | ICD-10-CM | POA: Diagnosis not present

## 2023-01-07 DIAGNOSIS — R300 Dysuria: Secondary | ICD-10-CM | POA: Insufficient documentation

## 2023-01-07 LAB — POCT URINALYSIS DIP (MANUAL ENTRY)
Bilirubin, UA: NEGATIVE
Glucose, UA: NEGATIVE mg/dL
Ketones, POC UA: NEGATIVE mg/dL
Nitrite, UA: NEGATIVE
Protein Ur, POC: 100 mg/dL — AB
Spec Grav, UA: 1.015 (ref 1.010–1.025)
Urobilinogen, UA: 0.2 U/dL
pH, UA: 6 (ref 5.0–8.0)

## 2023-01-07 MED ORDER — SULFAMETHOXAZOLE-TRIMETHOPRIM 800-160 MG PO TABS: 1.0000 | ORAL_TABLET | Freq: Two times a day (BID) | ORAL | 0 refills | Status: DC

## 2023-01-07 NOTE — Discharge Instructions (Signed)
I have sent you an antibiotic to treat UTI.  Urine culture is pending.  Will call when it results.  Follow-up if any symptoms persist or worsen.

## 2023-01-07 NOTE — ED Provider Notes (Addendum)
[facility]    CSN: [id number] Arrival date & time: 01/07/23  1710      History   Chief Complaint Chief Complaint  Patient presents with   Urinary Frequency    HPI Zachary Jacobs is a 67 y.o. male.   Patient presents with urinary burning that started about 1 to 2 days ago.  Denies urinary frequency, hematuria, penile discharge, back pain, abdominal pain, fever.  Reports that he has had previous UTI with last UTI being in December.  Patient does have a history of kidney stones but denies that this feels similar.   Urinary Frequency    Past Medical History:  Diagnosis Date   Allergy    Anemia    as child   COPD, mild (HCC)    very mild   History of kidney stones    OSA (obstructive sleep apnea)    s/p  surgery 1996   Pneumonia    Pre-diabetes    Renal calculus, left    Renal cyst, right    SIMPLE   Sleep apnea     cpap   dont wear latley    Patient Active Problem List   Diagnosis Date Noted   Asthma 05/10/2022   Prediabetes 07/12/2020   Unstable angina (HCC) 06/15/2020   Abnormal electrocardiogram during exercise stress test 06/15/2020   Incomplete left bundle branch block 05/31/2020   Lower urinary tract symptoms (LUTS) 03/13/2017   History of kidney stones 03/13/2017   Staghorn renal calculus 03/20/2015   Hydronephrosis     Past Surgical History:  Procedure Laterality Date   CYSTO/  RIGHT URETEROSCOPY/  STENT PLACEMENT  10/ 2001   CYSTOSCOPY/URETEROSCOPY/HOLMIUM LASER/STENT PLACEMENT Left 03/30/2015   Procedure: LEFT URETEROSCOPY/HOLMIUM LASER LITHOTRIPSY/STENT PLACEMENT, WITH  STONE BASKET EXTRACTION;  Surgeon: Ihor Gully, MD;  Location: [facility];  Service: Urology;  Laterality: Left;   HOLMIUM LASER APPLICATION Left 03/30/2015   Procedure: HOLMIUM LASER APPLICATION;  Surgeon: Ihor Gully, MD;  Location: [facility];  Service: Urology;  Laterality: Left;   IR URETERAL STENT LEFT NEW ACCESS W/O SEP  NEPHROSTOMY CATH  05/17/2019   LEFT HEART CATH AND CORONARY ANGIOGRAPHY N/A 06/15/2020   Procedure: LEFT HEART CATH AND CORONARY ANGIOGRAPHY;  Surgeon: Orpah Cobb, MD;  Location: [facility];  Service: Cardiovascular;  Laterality: N/A;   NEPHROLITHOTOMY Left 03/20/2015   Procedure: NEPHROLITHOTOMY PERCUTANEOUS;  Surgeon: Ihor Gully, MD;  Location: [facility];  Service: Urology;  Laterality: Left;   NEPHROLITHOTOMY Left 05/17/2019   Procedure: NEPHROLITHOTOMY PERCUTANEOUS;  Surgeon: Ihor Gully, MD;  Location: [facility];  Service: Urology;  Laterality: Left;   TONSILLECTOMY     as child   UVULOPALATOPHARYNGOPLASTY  1996   nasal surgery       Home Medications    Prior to Admission medications   Medication Sig Start Date End Date Taking? Authorizing Provider  sulfamethoxazole-trimethoprim (BACTRIM DS) 800-160 MG tablet Take 1 tablet by mouth 2 (two) times daily for 7 days. 01/07/23 01/14/23 Yes Kilyn Maragh, Acie Fredrickson, FNP  cephALEXin (KEFLEX) 500 MG capsule Take 1 capsule (500 mg total) by mouth 2 (two) times daily. 08/08/22     colchicine 0.6 MG tablet Take 2 tablets initially and then 1 tablet 1 hour later. 05/29/22   Ellsworth Lennox, PA-C  finasteride (PROSCAR) 5 MG tablet Take 1 tablet (5 mg total) by mouth daily. 09/18/22     Fluticasone-Umeclidin-Vilant (TRELEGY ELLIPTA) 200-62.5-25 MCG/ACT AEPB Inhale 1 puff into the lungs daily.  07/26/21   Hunsucker, Lesia Sago, MD  ibuprofen (ADVIL) 600 MG tablet Take 1 tablet (600 mg total) by mouth every 6 (six) hours as needed. 05/29/22   Ellsworth Lennox, PA-C  influenza vac split quadrivalent PF (FLUARIX) 0.5 ML injection Inject into the muscle. 02/20/22   Judyann Munson, MD  influenza vaccine adjuvanted (FLUAD QUADRIVALENT) 0.5 ML injection Inject into the muscle. 02/20/22   Judyann Munson, MD  levalbuterol [facility] HFA) 45 MCG/ACT inhaler Inhale 1 puff into the lungs every 6 (six) hours as needed for wheezing. 05/10/22   Viviano Simas, FNP  losartan (COZAAR) 25  MG tablet Take 1 tablet (25 mg total) by mouth daily. 03/26/21     nitroGLYCERIN (NITROSTAT) 0.4 MG SL tablet Place 0.4 mg under the tongue every 5 (five) minutes as needed for chest pain.    [provider]  pantoprazole (PROTONIX) 40 MG tablet Take 1 tablet (40 mg total) by mouth daily. 08/25/22   Hunsucker, Lesia Sago, MD  predniSONE (DELTASONE) 10 MG tablet Take 1 tablet (10 mg total) by mouth three times a day, in the morning, at noon, and at bedtime for 5 days 05/29/22   Ellsworth Lennox, PA-C  silodosin (RAPAFLO) 8 MG CAPS capsule Take 1 capsule (8 mg total) by mouth daily. 08/08/22     fluticasone (FLONASE) 50 MCG/ACT nasal spray Place 2 sprays into both nostrils daily. 10/12/20 10/25/21  Rema Fendt, NP    Family History Family History  Problem Relation Age of Onset   Cancer Mother    Heart disease Father    Colon polyps Father    Cancer Other    Obesity Other    Colon cancer Neg Hx    Esophageal cancer Neg Hx    Rectal cancer Neg Hx    Stomach cancer Neg Hx     Social History Social History   Tobacco Use   Smoking status: Former    Current packs/day: 0.00    Average packs/day: 1 pack/day for 20.0 years (20.0 ttl pk-yrs)    Types: Cigarettes    Start date: 03/27/1964    Quit date: 03/27/1984    Years since quitting: 38.8   Smokeless tobacco: Never  Substance Use Topics   Alcohol use: No   Drug use: No     Allergies   Patient has no known allergies.   Review of Systems Review of Systems Per HPI  Physical Exam Triage Vital Signs ED Triage Vitals  Encounter Vitals Group     BP 01/07/23 1740 (!) 147/95     Systolic BP Percentile --      Diastolic BP Percentile --      Pulse Rate 01/07/23 1740 62     Resp 01/07/23 1740 18     Temp 01/07/23 1740 98 F (36.7 C)     Temp Source 01/07/23 1740 Oral     SpO2 01/07/23 1740 93 %     Weight --      Height --      Head Circumference --      Peak Flow --      Pain Score 01/07/23 1744 0     Pain Loc --       Pain Education --      Exclude from Growth Chart --    No data found.  Updated Vital Signs BP (!) 147/95 (BP Location: Right Arm)   Pulse 62   Temp 98 F (36.7 C) (Oral)   Resp 18   SpO2  93%   Visual Acuity Right Eye Distance:   Left Eye Distance:   Bilateral Distance:    Right Eye Near:   Left Eye Near:    Bilateral Near:     Physical Exam Constitutional:      General: He is not in acute distress.    Appearance: Normal appearance. He is not toxic-appearing or diaphoretic.  HENT:     Head: Normocephalic and atraumatic.  Pulmonary:     Effort: Pulmonary effort is normal.  Musculoskeletal:        General: Normal range of motion.     Cervical back: Normal range of motion.  Skin:    General: Skin is warm and dry.  Neurological:     General: No focal deficit present.     Mental Status: He is alert and oriented to person, place, and time. Mental status is at baseline.  Psychiatric:        Mood and Affect: Mood normal.        Behavior: Behavior normal.      UC Treatments / Results  Labs (all labs ordered are listed, but only abnormal results are displayed) Labs Reviewed  POCT URINALYSIS DIP (MANUAL ENTRY) - Abnormal; Notable for the following components:      Result Value   Color, UA light yellow (*)    Clarity, UA cloudy (*)    Blood, UA moderate (*)    Protein Ur, POC =100 (*)    Leukocytes, UA Large (3+) (*)    All other components within normal limits  URINE CULTURE    EKG   Radiology No results found.  Procedures Procedures (including critical care time)  Medications Ordered in UC Medications - No data to display  Initial Impression / Assessment and Plan / UC Course  I have reviewed the triage vital signs and the nursing notes.  Pertinent labs & imaging results that were available during my care of the patient were reviewed by me and considered in my medical decision making (see chart for details).     UA indicating UTI.  Will treat with  Bactrim.  Crcl is normal so no dosage adjustment necessary. Urine culture pending.  Advised supportive care and strict follow-up if symptoms persist or worsen.  Advised strict ER precautions as well.  Patient verbalized understanding and was agreeable with plan.  Patient's oxygen reading at 93 on triage. Do not think this is accurate as patient does not appear to be in any distress and is not complaining of shortness of breath. Patient left before recheck could be completed.  Medical staff called patient who had a pulse oximeter at home.  He checked it and it was 97%. Final Clinical Impressions(s) / UC Diagnoses   Final diagnoses:  Acute cystitis with hematuria  Dysuria     Discharge Instructions      I have sent you an antibiotic to treat UTI.  Urine culture is pending.  Will call when it results.  Follow-up if any symptoms persist or worsen.    ED Prescriptions     Medication Sig Dispense Auth. Provider   sulfamethoxazole-trimethoprim (BACTRIM DS) 800-160 MG tablet Take 1 tablet by mouth 2 (two) times daily for 7 days. 14 tablet [place], Acie Fredrickson, [place]      PDMP not reviewed this encounter.   Gustavus Bryant, [place] 01/07/23 1902    Gustavus Bryant, FNP 01/07/23 1903    Gustavus Bryant, [place] 01/07/23 1929

## 2023-01-07 NOTE — ED Triage Notes (Signed)
C/o dysuria x 1 one day. Has hx of UTI

## 2023-01-08 LAB — URINE CULTURE: Culture: NO GROWTH

## 2023-01-10 ENCOUNTER — Ambulatory Visit
Admission: RE | Admit: 2023-01-10 | Discharge: 2023-01-10 | Disposition: A | Discharge status: Home / Self Care | Payer: Commercial Managed Care - PPO | Source: Ambulatory Visit | Attending: Family Medicine | Admitting: Family Medicine

## 2023-01-10 ENCOUNTER — Other Ambulatory Visit: Payer: Self-pay

## 2023-01-10 VITALS — BP 113/71 | HR 82 | Temp 98.9°F | Resp 16

## 2023-01-10 DIAGNOSIS — R3121 Asymptomatic microscopic hematuria: Secondary | ICD-10-CM

## 2023-01-10 DIAGNOSIS — R509 Fever, unspecified: Secondary | ICD-10-CM

## 2023-01-10 DIAGNOSIS — R7303 Prediabetes: Secondary | ICD-10-CM

## 2023-01-10 DIAGNOSIS — N390 Urinary tract infection, site not specified: Secondary | ICD-10-CM | POA: Diagnosis not present

## 2023-01-10 LAB — POCT URINALYSIS DIP (MANUAL ENTRY)
Bilirubin, UA: NEGATIVE
Glucose, UA: NEGATIVE mg/dL
Ketones, POC UA: NEGATIVE mg/dL
Nitrite, UA: NEGATIVE
Protein Ur, POC: 30 mg/dL — AB
Spec Grav, UA: 1.02 (ref 1.010–1.025)
Urobilinogen, UA: 0.2 U/dL
pH, UA: 6 (ref 5.0–8.0)

## 2023-01-10 MED ORDER — CEPHALEXIN 500 MG PO CAPS: 500.0000 mg | ORAL_CAPSULE | Freq: Two times a day (BID) | ORAL | 0 refills | Status: DC

## 2023-01-10 NOTE — Discharge Instructions (Signed)
Stop the sulfa antibiotic Take Keflex 2 times a day Drink lots of water Your lab test will be available through MyChart See your doctor if not improving by next week

## 2023-01-10 NOTE — ED Provider Notes (Signed)
[name]    CSN: [id number] Arrival date & time: 01/10/23  1203      History   Chief Complaint Chief Complaint  Patient presents with   Fever    HPI Zachary Jacobs is a 67 y.o. male.   Patient has recurring urinary tract infections.  He has a staghorn calculus.  He has BPH and urinary retention.  He is under the care of urology.  He has had fevers to 101.7 that are gradually coming down.  He was seen at urgent care for a UTI on 01/07/2023.  His urinalysis showed large leukocytes and red blood cells so he was placed on Bactrim pending culture.  He has learned that his culture is negative.  He is here to find out why he is still having fevers, fatigue, malaise.  No flank pain or kidney pain.  No abdominal pain.  No nausea vomiting or diarrhea.  No cough or cold symptoms.  No other source of fever identified.  No COVID testing has been done Patient is concerned he has an occult infection.  He worries that he may have health issues yet undiagnosed.  Has not had routine blood work for a while.    Past Medical History:  Diagnosis Date   Allergy    Anemia    as child   COPD, mild (HCC)    very mild   History of kidney stones    OSA (obstructive sleep apnea)    s/p  surgery 1996   Pneumonia    Pre-diabetes    Renal calculus, left    Renal cyst, right    SIMPLE   Sleep apnea     cpap   dont wear latley    Patient Active Problem List   Diagnosis Date Noted   Asthma 05/10/2022   Prediabetes 07/12/2020   Unstable angina (HCC) 06/15/2020   Abnormal electrocardiogram during exercise stress test 06/15/2020   Incomplete left bundle branch block 05/31/2020   Lower urinary tract symptoms (LUTS) 03/13/2017   History of kidney stones 03/13/2017   Staghorn renal calculus 03/20/2015   Hydronephrosis     Past Surgical History:  Procedure Laterality Date   CYSTO/  RIGHT URETEROSCOPY/  STENT PLACEMENT  10/ 2001   CYSTOSCOPY/URETEROSCOPY/HOLMIUM LASER/STENT PLACEMENT  Left 03/30/2015   Procedure: LEFT URETEROSCOPY/HOLMIUM LASER LITHOTRIPSY/STENT PLACEMENT, WITH  STONE BASKET EXTRACTION;  Surgeon: Ihor Gully, MD;  Location: [facility];  Service: Urology;  Laterality: Left;   HOLMIUM LASER APPLICATION Left 03/30/2015   Procedure: HOLMIUM LASER APPLICATION;  Surgeon: Ihor Gully, MD;  Location: [facility];  Service: Urology;  Laterality: Left;   IR URETERAL STENT LEFT NEW ACCESS W/O SEP NEPHROSTOMY CATH  05/17/2019   LEFT HEART CATH AND CORONARY ANGIOGRAPHY N/A 06/15/2020   Procedure: LEFT HEART CATH AND CORONARY ANGIOGRAPHY;  Surgeon: Orpah Cobb, MD;  Location: [facility];  Service: Cardiovascular;  Laterality: N/A;   NEPHROLITHOTOMY Left 03/20/2015   Procedure: NEPHROLITHOTOMY PERCUTANEOUS;  Surgeon: Ihor Gully, MD;  Location: [facility];  Service: Urology;  Laterality: Left;   NEPHROLITHOTOMY Left 05/17/2019   Procedure: NEPHROLITHOTOMY PERCUTANEOUS;  Surgeon: Ihor Gully, MD;  Location: [facility];  Service: Urology;  Laterality: Left;   TONSILLECTOMY     as child   UVULOPALATOPHARYNGOPLASTY  1996   nasal surgery       Home Medications    Prior to Admission medications   Medication Sig Start Date End Date Taking? Authorizing Provider  cephALEXin [facility])  500 MG capsule Take 1 capsule (500 mg total) by mouth 2 (two) times daily. 01/10/23   Eustace Moore, MD  colchicine 0.6 MG tablet Take 2 tablets initially and then 1 tablet 1 hour later. 05/29/22   Ellsworth Lennox, PA-C  finasteride (PROSCAR) 5 MG tablet Take 1 tablet (5 mg total) by mouth daily. 09/18/22     Fluticasone-Umeclidin-Vilant (TRELEGY ELLIPTA) 200-62.5-25 MCG/ACT AEPB Inhale 1 puff into the lungs daily. 07/26/21   Hunsucker, Lesia Sago, MD  ibuprofen (ADVIL) 600 MG tablet Take 1 tablet (600 mg total) by mouth every 6 (six) hours as needed. 05/29/22   Ellsworth Lennox, PA-C  levalbuterol [facility] HFA) 45 MCG/ACT inhaler Inhale 1 puff into the lungs every 6 (six)  hours as needed for wheezing. 05/10/22   Viviano Simas, FNP  losartan (COZAAR) 25 MG tablet Take 1 tablet (25 mg total) by mouth daily. 03/26/21     nitroGLYCERIN (NITROSTAT) 0.4 MG SL tablet Place 0.4 mg under the tongue every 5 (five) minutes as needed for chest pain.    [provider]  pantoprazole (PROTONIX) 40 MG tablet Take 1 tablet (40 mg total) by mouth daily. 08/25/22   Hunsucker, Lesia Sago, MD  silodosin (RAPAFLO) 8 MG CAPS capsule Take 1 capsule (8 mg total) by mouth daily. 08/08/22     fluticasone (FLONASE) 50 MCG/ACT nasal spray Place 2 sprays into both nostrils daily. 10/12/20 10/25/21  Rema Fendt, NP    Family History Family History  Problem Relation Age of Onset   Cancer Mother    Heart disease Father    Colon polyps Father    Cancer Other    Obesity Other    Colon cancer Neg Hx    Esophageal cancer Neg Hx    Rectal cancer Neg Hx    Stomach cancer Neg Hx     Social History Social History   Tobacco Use   Smoking status: Former    Current packs/day: 0.00    Average packs/day: 1 pack/day for 20.0 years (20.0 ttl pk-yrs)    Types: Cigarettes    Start date: 03/27/1964    Quit date: 03/27/1984    Years since quitting: 38.8   Smokeless tobacco: Never  Substance Use Topics   Alcohol use: No   Drug use: No     Allergies   Patient has no known allergies.   Review of Systems Review of Systems  See HPI Physical Exam Triage Vital Signs ED Triage Vitals [01/10/23 1210]  Encounter Vitals Group     BP 113/71     Systolic BP Percentile      Diastolic BP Percentile      Pulse Rate 82     Resp 16     Temp 98.9 F (37.2 C)     Temp Source Oral     SpO2 99 %     Weight      Height      Head Circumference      Peak Flow      Pain Score 0     Pain Loc      Pain Education      Exclude from Growth Chart    No data found.  Updated Vital Signs BP 113/71 (BP Location: Right Arm)   Pulse 82   Temp 98.9 F (37.2 C) (Oral)   Resp 16   SpO2 99%       Physical Exam Constitutional:      General: He is not in acute  distress.    Appearance: He is well-developed. He is obese. He is ill-appearing.  HENT:     Head: Normocephalic and atraumatic.     Right Ear: Tympanic membrane and ear canal normal.     Left Ear: Tympanic membrane and ear canal normal.     Nose: Nose normal. No congestion.     Mouth/Throat:     Pharynx: No posterior oropharyngeal erythema.  Eyes:     Conjunctiva/sclera: Conjunctivae normal.     Pupils: Pupils are equal, round, and reactive to light.  Cardiovascular:     Rate and Rhythm: Normal rate and regular rhythm.     Heart sounds: Normal heart sounds.  Pulmonary:     Effort: Pulmonary effort is normal. No respiratory distress.     Breath sounds: Normal breath sounds.  Abdominal:     Palpations: Abdomen is soft.     Tenderness: There is no abdominal tenderness. There is no right CVA tenderness or left CVA tenderness.  Musculoskeletal:        General: Normal range of motion.     Cervical back: Normal range of motion.  Lymphadenopathy:     Cervical: No cervical adenopathy.  Skin:    General: Skin is warm and dry.     Findings: No rash.     Comments: Large sebaceous cyst in center of back  Neurological:     Mental Status: He is alert.      UC Treatments / Results  Labs (all labs ordered are listed, but only abnormal results are displayed) Labs Reviewed  POCT URINALYSIS DIP (MANUAL ENTRY) - Abnormal; Notable for the following components:      Result Value   Clarity, UA cloudy (*)    Blood, UA moderate (*)    Protein Ur, POC =30 (*)    Leukocytes, UA Large (3+) (*)    All other components within normal limits  COMPREHENSIVE METABOLIC PANEL  CBC WITH DIFFERENTIAL/PLATELET  HEMOGLOBIN A1C    EKG   Radiology No results found.  Procedures Procedures (including critical care time)  Medications Ordered in UC Medications - No data to display  Initial Impression / Assessment and Plan / UC  Course  I have reviewed the triage vital signs and the nursing notes.  Pertinent labs & imaging results that were available during my care of the patient were reviewed by me and considered in my medical decision making (see chart for details).     Patient still has a positive urinalysis, I am not repeating his culture because his last culture was negative and he has had sulfamethoxazole/trimethoprim He has concerns for his health.  He is due for blood work so I will draw this in anticipation of a primary care follow-up visit next week I am not certain what is causing his fever.  I am going to place him on Keflex which usually helps his urinary tract infections, await lab tests Final Clinical Impressions(s) / UC Diagnoses   Final diagnoses:  FUO (fever of unknown origin)  Prediabetes  Asymptomatic microscopic hematuria  Recurrent urinary tract infection     Discharge Instructions      Stop the sulfa antibiotic Take Keflex 2 times a day Drink lots of water Your lab test will be available through MyChart See your doctor if not improving by next week   ED Prescriptions     Medication Sig Dispense Auth. Provider   cephALEXin (KEFLEX) 500 MG capsule Take 1 capsule (500 mg total) by mouth 2 (two)  times daily. 14 capsule Eustace Moore, MD      PDMP not reviewed this encounter.   Eustace Moore, MD 01/10/23 1325

## 2023-01-10 NOTE — ED Triage Notes (Addendum)
Fevers x 3 days. Recently seen at an urgent care for urinary complaints but urine culture was negative. Is on bactrim. Reports has cyst on back.

## 2023-01-11 LAB — COMPREHENSIVE METABOLIC PANEL
ALT: 37 IU/L (ref 0–44)
AST: 38 IU/L (ref 0–40)
Albumin: 4.2 g/dL (ref 3.9–4.9)
Alkaline Phosphatase: 119 IU/L (ref 44–121)
BUN/Creatinine Ratio: 9 — ABNORMAL LOW (ref 10–24)
BUN: 12 mg/dL (ref 8–27)
Bilirubin Total: 0.4 mg/dL (ref 0.0–1.2)
CO2: 19 mmol/L — ABNORMAL LOW (ref 20–29)
Calcium: 8.9 mg/dL (ref 8.6–10.2)
Chloride: 101 mmol/L (ref 96–106)
Creatinine, Ser: 1.3 mg/dL — ABNORMAL HIGH (ref 0.76–1.27)
Globulin, Total: 3.5 g/dL (ref 1.5–4.5)
Glucose: 107 mg/dL — ABNORMAL HIGH (ref 70–99)
Potassium: 4.5 mmol/L (ref 3.5–5.2)
Sodium: 136 mmol/L (ref 134–144)
Total Protein: 7.7 g/dL (ref 6.0–8.5)
eGFR: 61 mL/min/{1.73_m2} (ref >59)

## 2023-01-11 LAB — CBC WITH DIFFERENTIAL/PLATELET
Basophils Absolute: 0 10*3/uL (ref 0.0–0.2)
Basos: 0 %
EOS (ABSOLUTE): 0.1 10*3/uL (ref 0.0–0.4)
Eos: 1 %
Hematocrit: 46.3 % (ref 37.5–51.0)
Hemoglobin: 15.5 g/dL (ref 13.0–17.7)
Immature Grans (Abs): 0.1 10*3/uL (ref 0.0–0.1)
Immature Granulocytes: 1 %
Lymphocytes Absolute: 0.8 10*3/uL (ref 0.7–3.1)
Lymphs: 8 %
MCH: 31.4 pg (ref 26.6–33.0)
MCHC: 33.5 g/dL (ref 31.5–35.7)
MCV: 94 fL (ref 79–97)
Monocytes Absolute: 1.1 10*3/uL — ABNORMAL HIGH (ref 0.1–0.9)
Monocytes: 10 %
Neutrophils Absolute: 8.3 10*3/uL — ABNORMAL HIGH (ref 1.4–7.0)
Neutrophils: 80 %
Platelets: 255 10*3/uL (ref 150–450)
RBC: 4.94 x10E6/uL (ref 4.14–5.80)
RDW: 12.6 % (ref 11.6–15.4)
WBC: 10.4 10*3/uL (ref 3.4–10.8)

## 2023-01-12 LAB — HEMOGLOBIN A1C
Est. average glucose Bld gHb Est-mCnc: 128 mg/dL
Hgb A1c MFr Bld: 6.1 % — ABNORMAL HIGH (ref 4.8–5.6)

## 2023-01-12 LAB — SPECIMEN STATUS REPORT

## 2023-01-30 ENCOUNTER — Ambulatory Visit
Admission: RE | Admit: 2023-01-30 | Discharge: 2023-01-30 | Disposition: A | Discharge status: Home / Self Care | Payer: Commercial Managed Care - PPO | Source: Ambulatory Visit | Attending: Emergency Medicine | Admitting: Emergency Medicine

## 2023-01-30 VITALS — BP 115/76 | HR 62 | Temp 98.7°F | Resp 18 | Ht 70.0 in | Wt 267.0 lb

## 2023-01-30 DIAGNOSIS — H43392 Other vitreous opacities, left eye: Secondary | ICD-10-CM

## 2023-01-30 NOTE — ED Provider Notes (Signed)
[facility]    CSN: [id number] Arrival date & time: 01/30/23  1133      History   Chief Complaint Chief Complaint  Patient presents with   Eye Problem    Have a spot in my vision.  And have had intermittent light spots in my vision. - Entered by patient    HPI Zachary Jacobs is a 67 y.o. male.  Here with concerns of a "spot" in his left eye. Sees something float by intermittently.  Denies sudden vision loss. No eye pain. Not having redness, itching, or discharge. No foreign body sensation, no trauma   Wears readers Prediabetes. Last A1c 6.1  Past Medical History:  Diagnosis Date   Allergy    Anemia    as child   COPD, mild (HCC)    very mild   History of kidney stones    OSA (obstructive sleep apnea)    s/p  surgery 1996   Pneumonia    Pre-diabetes    Renal calculus, left    Renal cyst, right    SIMPLE   Sleep apnea     cpap   dont wear latley    Patient Active Problem List   Diagnosis Date Noted   Asthma 05/10/2022   Prediabetes 07/12/2020   Unstable angina (HCC) 06/15/2020   Abnormal electrocardiogram during exercise stress test 06/15/2020   Incomplete left bundle branch block 05/31/2020   Lower urinary tract symptoms (LUTS) 03/13/2017   History of kidney stones 03/13/2017   Staghorn renal calculus 03/20/2015   Hydronephrosis     Past Surgical History:  Procedure Laterality Date   CYSTO/  RIGHT URETEROSCOPY/  STENT PLACEMENT  10/ 2001   CYSTOSCOPY/URETEROSCOPY/HOLMIUM LASER/STENT PLACEMENT Left 03/30/2015   Procedure: LEFT URETEROSCOPY/HOLMIUM LASER LITHOTRIPSY/STENT PLACEMENT, WITH  STONE BASKET EXTRACTION;  Surgeon: Ihor Gully, MD;  [facility] [place];  Service: Urology;  Laterality: Left;   HOLMIUM LASER APPLICATION Left 03/30/2015   Procedure: HOLMIUM LASER APPLICATION;  Surgeon: Ihor Gully, MD;  Location: [facility];  Service: Urology;  Laterality: Left;   IR URETERAL STENT LEFT NEW ACCESS W/O  SEP NEPHROSTOMY CATH  05/17/2019   LEFT HEART CATH AND CORONARY ANGIOGRAPHY N/A 06/15/2020   Procedure: LEFT HEART CATH AND CORONARY ANGIOGRAPHY;  Surgeon: Orpah Cobb, MD;  Location: [facility];  Service: Cardiovascular;  Laterality: N/A;   NEPHROLITHOTOMY Left 03/20/2015   Procedure: NEPHROLITHOTOMY PERCUTANEOUS;  Surgeon: Ihor Gully, MD;  Location: [facility];  Service: Urology;  Laterality: Left;   NEPHROLITHOTOMY Left 05/17/2019   Procedure: NEPHROLITHOTOMY PERCUTANEOUS;  Surgeon: Ihor Gully, MD;  Location: [facility];  Service: Urology;  Laterality: Left;   TONSILLECTOMY     as child   UVULOPALATOPHARYNGOPLASTY  1996   nasal surgery     Home Medications    Prior to Admission medications   Medication Sig Start Date End Date Taking? Authorizing Provider  colchicine 0.6 MG tablet Take 2 tablets initially and then 1 tablet 1 hour later. 05/29/22  Yes Ellsworth Lennox, PA-C  finasteride (PROSCAR) 5 MG tablet Take 1 tablet (5 mg total) by mouth daily. 09/18/22  Yes   Fluticasone-Umeclidin-Vilant (TRELEGY ELLIPTA) 200-62.5-25 MCG/ACT AEPB Inhale 1 puff into the lungs daily. 07/26/21  Yes Hunsucker, Lesia Sago, MD  ibuprofen (ADVIL) 600 MG tablet Take 1 tablet (600 mg total) by mouth every 6 (six) hours as needed. 05/29/22  Yes Ellsworth Lennox, PA-C  levalbuterol [facility] HFA) 45 MCG/ACT inhaler Inhale 1 puff into the  lungs every 6 (six) hours as needed for wheezing. 05/10/22  Yes Viviano Simas, FNP  losartan (COZAAR) 25 MG tablet Take 1 tablet (25 mg total) by mouth daily. 03/26/21  Yes   nitroGLYCERIN (NITROSTAT) 0.4 MG SL tablet Place 0.4 mg under the tongue every 5 (five) minutes as needed for chest pain.   Yes [provider]  pantoprazole (PROTONIX) 40 MG tablet Take 1 tablet (40 mg total) by mouth daily. 08/25/22  Yes Hunsucker, Lesia Sago, MD  silodosin (RAPAFLO) 8 MG CAPS capsule Take 1 capsule (8 mg total) by mouth daily. 08/08/22  Yes   fluticasone (FLONASE) 50 MCG/ACT nasal spray  Place 2 sprays into both nostrils daily. 10/12/20 10/25/21  Rema Fendt, NP    Family History Family History  Problem Relation Age of Onset   Cancer Mother    Heart disease Father    Colon polyps Father    Cancer Other    Obesity Other    Colon cancer Neg Hx    Esophageal cancer Neg Hx    Rectal cancer Neg Hx    Stomach cancer Neg Hx     Social History Social History   Tobacco Use   Smoking status: Former    Current packs/day: 0.00    Average packs/day: 1 pack/day for 20.0 years (20.0 ttl pk-yrs)    Types: Cigarettes    Start date: 03/27/1964    Quit date: 03/27/1984    Years since quitting: 38.8   Smokeless tobacco: Never  Substance Use Topics   Alcohol use: No   Drug use: No     Allergies   Patient has no known allergies.   Review of Systems Review of Systems As per HPI  Physical Exam Triage Vital Signs ED Triage Vitals  Encounter Vitals Group     BP      Systolic BP Percentile      Diastolic BP Percentile      Pulse      Resp      Temp      Temp src      SpO2      Weight      Height      Head Circumference      Peak Flow      Pain Score      Pain Loc      Pain Education      Exclude from Growth Chart    No data found.  Updated Vital Signs BP 115/76 (BP Location: Right Arm)   Pulse 62   Temp 98.7 F (37.1 C) (Oral)   Resp 18   Ht 5\' 10"  (1.778 m)   Wt 267 lb (121.1 kg)   SpO2 93%   BMI 38.31 kg/m   Visual Acuity Right Eye Distance: 20 20 Left Eye Distance: 20 25 Bilateral Distance: 20 25  Right Eye Near:   Left Eye Near:    Bilateral Near:     Physical Exam Vitals and nursing note reviewed.  Constitutional:      General: He is not in acute distress.    Appearance: Normal appearance. He is not ill-appearing.  Eyes:     General: Lids are normal. Lids are everted, no foreign bodies appreciated. Vision grossly intact. Gaze aligned appropriately. No visual field deficit.       Right eye: No foreign body or discharge.         Left eye: No foreign body or discharge.     Extraocular Movements: Extraocular  movements intact.     Right eye: Normal extraocular motion.     Left eye: Normal extraocular motion.     Conjunctiva/sclera: Conjunctivae normal.     Right eye: Right conjunctiva is not injected.     Left eye: Left conjunctiva is not injected.     Pupils: Pupils are equal, round, and reactive to light.     Right eye: No corneal abrasion.     Left eye: No corneal abrasion.  Cardiovascular:     Rate and Rhythm: Normal rate and regular rhythm.  Pulmonary:     Effort: Pulmonary effort is normal.  Musculoskeletal:     Cervical back: Normal range of motion.  Skin:    General: Skin is warm and dry.  Neurological:     Mental Status: He is alert and oriented to person, place, and time.      UC Treatments / Results  Labs (all labs ordered are listed, but only abnormal results are displayed) Labs Reviewed - No data to display  EKG  Radiology No results found.  Procedures Procedures (including critical care time)  Medications Ordered in UC Medications - No data to display  Initial Impression / Assessment and Plan / UC Course  I have reviewed the triage vital signs and the nursing notes.  Pertinent labs & imaging results that were available during my care of the patient were reviewed by me and considered in my medical decision making (see chart for details).  Floaters in left eye Normal external eye exam. No red flags. No concern for infection or foreign body. Visual acuity intact Needs follow up with eye specialist.  Provided with several clinics, call to make appointment Discussed ED precautions   Final Clinical Impressions(s) / UC Diagnoses   Final diagnoses:  Floaters in visual field, left     Discharge Instructions      Please call eye specialist for follow up  If at any point you develop severe eye pain, or have sudden loss of vision, go directly to the emergency department.       ED Prescriptions   None    PDMP not reviewed this encounter.   Marlow Baars, [place] 01/30/23 1243

## 2023-01-30 NOTE — ED Triage Notes (Signed)
Patient c/o difficulty w/left eye x 5 days, slight pain, seeing "a hair,light sensation" in eye.  The light sensation is intermittent.  No injury to the eye.    Does wear reading glasses.

## 2023-01-30 NOTE — Discharge Instructions (Addendum)
Please call eye specialist for follow up  If at any point you develop severe eye pain, or have sudden loss of vision, go directly to the emergency department.

## 2023-02-02 DIAGNOSIS — H43813 Vitreous degeneration, bilateral: Secondary | ICD-10-CM | POA: Diagnosis not present

## 2023-02-02 DIAGNOSIS — H2513 Age-related nuclear cataract, bilateral: Secondary | ICD-10-CM | POA: Diagnosis not present

## 2023-02-25 DIAGNOSIS — R0602 Shortness of breath: Secondary | ICD-10-CM | POA: Diagnosis not present

## 2023-02-25 DIAGNOSIS — R072 Precordial pain: Secondary | ICD-10-CM | POA: Diagnosis not present

## 2023-02-25 DIAGNOSIS — J449 Chronic obstructive pulmonary disease, unspecified: Secondary | ICD-10-CM | POA: Diagnosis not present

## 2023-02-25 DIAGNOSIS — E6609 Other obesity due to excess calories: Secondary | ICD-10-CM | POA: Diagnosis not present

## 2023-03-04 DIAGNOSIS — H2513 Age-related nuclear cataract, bilateral: Secondary | ICD-10-CM | POA: Diagnosis not present

## 2023-03-04 DIAGNOSIS — H43813 Vitreous degeneration, bilateral: Secondary | ICD-10-CM | POA: Diagnosis not present

## 2023-03-25 DIAGNOSIS — N200 Calculus of kidney: Secondary | ICD-10-CM | POA: Diagnosis not present

## 2023-03-25 DIAGNOSIS — R3914 Feeling of incomplete bladder emptying: Secondary | ICD-10-CM | POA: Diagnosis not present

## 2023-03-25 DIAGNOSIS — N401 Enlarged prostate with lower urinary tract symptoms: Secondary | ICD-10-CM | POA: Diagnosis not present

## 2023-04-08 ENCOUNTER — Emergency Department (HOSPITAL_BASED_OUTPATIENT_CLINIC_OR_DEPARTMENT_OTHER): Payer: Commercial Managed Care - PPO

## 2023-04-08 ENCOUNTER — Encounter (HOSPITAL_BASED_OUTPATIENT_CLINIC_OR_DEPARTMENT_OTHER): Payer: Self-pay | Admitting: Emergency Medicine

## 2023-04-08 ENCOUNTER — Emergency Department (HOSPITAL_COMMUNITY): Payer: Commercial Managed Care - PPO

## 2023-04-08 ENCOUNTER — Emergency Department (HOSPITAL_BASED_OUTPATIENT_CLINIC_OR_DEPARTMENT_OTHER)
Admission: EM | Admit: 2023-04-08 | Discharge: 2023-04-08 | Disposition: A | Discharge status: Home / Self Care | Payer: Commercial Managed Care - PPO | Attending: Emergency Medicine | Admitting: Emergency Medicine

## 2023-04-08 DIAGNOSIS — I10 Essential (primary) hypertension: Secondary | ICD-10-CM | POA: Diagnosis not present

## 2023-04-08 DIAGNOSIS — G3189 Other specified degenerative diseases of nervous system: Secondary | ICD-10-CM | POA: Diagnosis not present

## 2023-04-08 DIAGNOSIS — J45909 Unspecified asthma, uncomplicated: Secondary | ICD-10-CM | POA: Diagnosis not present

## 2023-04-08 DIAGNOSIS — H539 Unspecified visual disturbance: Secondary | ICD-10-CM

## 2023-04-08 DIAGNOSIS — R911 Solitary pulmonary nodule: Secondary | ICD-10-CM | POA: Diagnosis not present

## 2023-04-08 DIAGNOSIS — R29818 Other symptoms and signs involving the nervous system: Secondary | ICD-10-CM | POA: Diagnosis not present

## 2023-04-08 DIAGNOSIS — Z7951 Long term (current) use of inhaled steroids: Secondary | ICD-10-CM | POA: Insufficient documentation

## 2023-04-08 DIAGNOSIS — R9431 Abnormal electrocardiogram [ECG] [EKG]: Secondary | ICD-10-CM | POA: Diagnosis not present

## 2023-04-08 DIAGNOSIS — H538 Other visual disturbances: Secondary | ICD-10-CM | POA: Diagnosis not present

## 2023-04-08 LAB — CBC
HCT: 43.2 % (ref 39.0–52.0)
Hemoglobin: 14.7 g/dL (ref 13.0–17.0)
MCH: 31.1 pg (ref 26.0–34.0)
MCHC: 34 g/dL (ref 30.0–36.0)
MCV: 91.3 fL (ref 80.0–100.0)
Platelets: 280 10*3/uL (ref 150–400)
RBC: 4.73 MIL/uL (ref 4.22–5.81)
RDW: 12.4 % (ref 11.5–15.5)
WBC: 10.5 10*3/uL (ref 4.0–10.5)
nRBC: 0 % (ref 0.0–0.2)

## 2023-04-08 LAB — DIFFERENTIAL
Abs Immature Granulocytes: 0.04 10*3/uL (ref 0.00–0.07)
Basophils Absolute: 0 10*3/uL (ref 0.0–0.1)
Basophils Relative: 0 %
Eosinophils Absolute: 0.6 10*3/uL — ABNORMAL HIGH (ref 0.0–0.5)
Eosinophils Relative: 5 %
Immature Granulocytes: 0 %
Lymphocytes Relative: 25 %
Lymphs Abs: 2.7 10*3/uL (ref 0.7–4.0)
Monocytes Absolute: 1.1 10*3/uL — ABNORMAL HIGH (ref 0.1–1.0)
Monocytes Relative: 11 %
Neutro Abs: 6.1 10*3/uL (ref 1.7–7.7)
Neutrophils Relative %: 59 %

## 2023-04-08 LAB — COMPREHENSIVE METABOLIC PANEL
ALT: 18 U/L (ref 0–44)
AST: 18 U/L (ref 15–41)
Albumin: 3.5 g/dL (ref 3.5–5.0)
Alkaline Phosphatase: 73 U/L (ref 38–126)
Anion gap: 9 (ref 5–15)
BUN: 15 mg/dL (ref 8–23)
CO2: 25 mmol/L (ref 22–32)
Calcium: 8.4 mg/dL — ABNORMAL LOW (ref 8.9–10.3)
Chloride: 102 mmol/L (ref 98–111)
Creatinine, Ser: 0.99 mg/dL (ref 0.61–1.24)
GFR, Estimated: >60 mL/min (ref >60)
Glucose, Bld: 118 mg/dL — ABNORMAL HIGH (ref 70–99)
Potassium: 3.8 mmol/L (ref 3.5–5.1)
Sodium: 136 mmol/L (ref 135–145)
Total Bilirubin: 0.5 mg/dL (ref <1.2)
Total Protein: 7.3 g/dL (ref 6.5–8.1)

## 2023-04-08 LAB — PROTIME-INR
INR: 1 (ref 0.8–1.2)
Prothrombin Time: 13.3 s (ref 11.4–15.2)

## 2023-04-08 LAB — APTT: aPTT: 24 s (ref 24–36)

## 2023-04-08 MED ORDER — IOHEXOL 350 MG/ML SOLN
75.0000 mL | Freq: Once | INTRAVENOUS | Status: AC | PRN
  Administered 2023-04-08: 75 mL via INTRAVENOUS

## 2023-04-08 NOTE — ED Provider Notes (Signed)
I assumed care of this patient from previous provider.  Please see their note for further details of history, exam, and MDM.   Briefly patient is a 67 y.o. male who presented from [facility] to rule out stroke. Patient had 15-20 min episode of peripheral vision changes. Now resolved.  Labs and CTA negative. Sent for MRI.  On my exam, patient has intact strength and sensation. Does have abnormal test of skew.  7:27 AM Still awaiting MRI.  Patient care turned over to oncoming provider. Patient case and results discussed in detail; please see their note for further ED managment.         Nira Conn, MD 04/18/23 [phone]

## 2023-04-08 NOTE — Discharge Instructions (Addendum)
As discussed follow-up with neurology.  Turn immediately if develop sudden onset headache, vision changes, facial droop, difficulty finding words, unilateral weakness, balance issues or you develop any new or worsening symptoms that are concerning to you.

## 2023-04-08 NOTE — ED Triage Notes (Signed)
Pt notice vision changes in both eyes about 30-40 mins ago.

## 2023-04-08 NOTE — ED Provider Notes (Signed)
Assumed care from the night team.  Patient was transferred from [facility] for MRI.  See provider's note for full HPI.  Briefly, had transient vision loss on the right side.  Sent here for MRI to evaluate for TIA/stroke.  Patient continues to be symptom-free.  MRI today is negative.  He is feeling improved.  Stable vitals.  Lab work reviewed unremarkable.  CTA and MRI reviewed; no acute findings.  Stable for discharge at this time to follow-up with neurology for TIA   Coral Spikes, DO 04/08/23 1610

## 2023-04-08 NOTE — ED Notes (Signed)
[facility] called for transport , No ETA Patient is going ED to ED [facility] Called @ 04:17 am

## 2023-04-08 NOTE — ED Provider Notes (Signed)
[facility] Provider Note   CSN: [id number] Arrival date & time: 04/08/23  0129     History  Chief Complaint  Patient presents with   Eye Problem    Zachary Jacobs is a 67 y.o. male.  67 year old male with past medical history of pretension, BPH, possible asthma who presents to the ER from his shift secondary to visual changes.  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  States that it is kind of wavy and abnormal.  It happens with both eyes open, when he closes his left eye and closes his right eye separately has the same issue.  No pressure in his eyes he does have a headache at this time and states his blood pressure is not normally above 120.  No other sensation or motor changes.  No history of stroke.   Eye Problem      Home Medications Prior to Admission medications   Medication Sig Start Date End Date Taking? Authorizing Provider  colchicine 0.6 MG tablet Take 2 tablets initially and then 1 tablet 1 hour later. 05/29/22   Ellsworth Lennox, PA-C  finasteride (PROSCAR) 5 MG tablet Take 1 tablet (5 mg total) by mouth daily. 09/18/22     Fluticasone-Umeclidin-Vilant (TRELEGY ELLIPTA) 200-62.5-25 MCG/ACT AEPB Inhale 1 puff into the lungs daily. 07/26/21   Hunsucker, Lesia Sago, MD  ibuprofen (ADVIL) 600 MG tablet Take 1 tablet (600 mg total) by mouth every 6 (six) hours as needed. 05/29/22   Ellsworth Lennox, PA-C  levalbuterol [facility] HFA) 45 MCG/ACT inhaler Inhale 1 puff into the lungs every 6 (six) hours as needed for wheezing. 05/10/22   Viviano Simas, FNP  losartan (COZAAR) 25 MG tablet Take 1 tablet (25 mg total) by mouth daily. 03/26/21     nitroGLYCERIN (NITROSTAT) 0.4 MG SL tablet Place 0.4 mg under the tongue every 5 (five) minutes as needed for chest pain.    [provider]  pantoprazole (PROTONIX) 40 MG tablet Take 1 tablet (40 mg total) by  mouth daily. 08/25/22   Hunsucker, Lesia Sago, MD  silodosin (RAPAFLO) 8 MG CAPS capsule Take 1 capsule (8 mg total) by mouth daily. 08/08/22     fluticasone (FLONASE) 50 MCG/ACT nasal spray Place 2 sprays into both nostrils daily. 10/12/20 10/25/21  Rema Fendt, NP      Allergies    Patient has no known allergies.    Review of Systems   Review of Systems  Physical Exam Updated Vital Signs BP 131/75 (BP Location: Right Arm)   Pulse (!) 57   Temp 98.4 F (36.9 C) (Oral)   Resp 18   Ht 5\' 10"  (1.778 m)   Wt 117.9 kg   SpO2 97%   BMI 37.31 kg/m  Physical Exam Vitals and nursing note reviewed.  Constitutional:      Appearance: He is well-developed.  HENT:     Head: Normocephalic and atraumatic.  Eyes:     Comments: Limited funduscopic exam without obvious hemorrhage, optic disc swelling or hemorrhage.  Extraocular movements are intact.  Visual acuity per nursing is 20/32 in both eyes.  Visual fields are intact to fingers at this time (at the time of this patient states that his symptoms are already improving).  Cardiovascular:     Rate and Rhythm: Normal rate.  Pulmonary:     Effort: Pulmonary effort is normal. No respiratory distress.  Abdominal:  General: There is no distension.  Musculoskeletal:        General: Normal range of motion.     Cervical back: Normal range of motion.  Neurological:     Mental Status: He is alert and oriented to person, place, and time.     Sensory: No sensory deficit.     Motor: No weakness.     Coordination: Coordination normal.     Gait: Gait normal.     ED Results / Procedures / Treatments   Labs (all labs ordered are listed, but only abnormal results are displayed) Labs Reviewed  DIFFERENTIAL - Abnormal; Notable for the following components:      Result Value   Monocytes Absolute 1.1 (*)    Eosinophils Absolute 0.6 (*)    All other components within normal limits  COMPREHENSIVE METABOLIC PANEL - Abnormal; Notable for the  following components:   Glucose, Bld 118 (*)    Calcium 8.4 (*)    All other components within normal limits  PROTIME-INR  APTT  CBC    EKG EKG Interpretation Date/Time:  Wednesday April 08 2023 01:41:14 EST Ventricular Rate:  55 PR Interval:  221 QRS Duration:  119 QT Interval:  455 QTC Calculation: 436 R Axis:   -69  Text Interpretation: Sinus rhythm Prolonged PR interval Left anterior fascicular block Confirmed by Marily Memos [phone]) on 04/08/2023 2:08:52 AM  Radiology CT ANGIO HEAD NECK W WO CM  Result Date: 04/08/2023 CLINICAL DATA:  Initial evaluation for acute neuro deficit, stroke suspected. EXAM: CT ANGIOGRAPHY HEAD AND NECK WITH AND WITHOUT CONTRAST TECHNIQUE: Multidetector CT imaging of the head and neck was performed using the standard protocol during bolus administration of intravenous contrast. Multiplanar CT image reconstructions and MIPs were obtained to evaluate the vascular anatomy. Carotid stenosis measurements (when applicable) are obtained utilizing NASCET criteria, using the distal internal carotid diameter as the denominator. RADIATION DOSE REDUCTION: This exam was performed according to the departmental dose-optimization program which includes automated exposure control, adjustment of the mA and/or kV according to patient size and/or use of iterative reconstruction technique. CONTRAST:  75mL OMNIPAQUE IOHEXOL 350 MG/ML SOLN COMPARISON:  None Available. FINDINGS: CT HEAD FINDINGS Brain: Age-related cerebral atrophy with mild to moderate chronic microvascular ischemic disease. No acute intracranial hemorrhage. No acute large vessel territory infarct. No mass lesion or midline shift. No hydrocephalus or extra-axial fluid collection. Vascular: No abnormal hyperdense vessel. Calcified atherosclerosis present about the skull base. Skull: Scalp soft tissues within normal limits.  Calvarium intact. Sinuses/Orbits: Globes and orbital soft tissues within normal limits.  Scattered mucosal thickening present about the ethmoidal air cells and maxillary sinuses. Superimposed small air-fluid level noted within the right maxillary sinus. No mastoid effusion. Other: None. Review of the MIP images confirms the above findings CTA NECK FINDINGS Aortic arch: Standard branching. Imaged portion shows no evidence of aneurysm or dissection. No significant stenosis of the major arch vessel origins. Right carotid system: No evidence of dissection, stenosis (50% or greater), or occlusion. Left carotid system: No evidence of dissection, stenosis (50% or greater), or occlusion. Vertebral arteries: No evidence of dissection, stenosis (50% or greater), or occlusion. Skeleton: No discrete or worrisome osseous lesions. Other neck: No other acute finding. Upper chest: 5 mm right upper lobe pulmonary nodule (series 3, image 15), indeterminate. This is new as compared to prior CT from 07/15/2022. No other acute finding. Review of the MIP images confirms the above findings CTA HEAD FINDINGS Anterior circulation: Both internal carotid arteries are  patent to the termini without stenosis. A1 segments, anterior communicating artery complex common anterior cerebral arteries widely patent. No M1 stenosis or occlusion. Distal MCA branches perfused and symmetric. Posterior circulation: Both V4 segments patent without significant stenosis. Right PICA patent. Left PICA not well seen. Basilar widely patent without stenosis. Superior cerebellar and posterior cerebral arteries patent bilaterally. Venous sinuses: Patent allowing for timing the contrast bolus. Anatomic variants: None significant.  No aneurysm. Review of the MIP images confirms the above findings IMPRESSION: CT HEAD: 1. No acute intracranial abnormality. 2. Age-related cerebral atrophy with mild to moderate chronic microvascular ischemic disease. CTA HEAD AND NECK: 1. Negative CTA of the head and neck. No large vessel occlusion or other emergent finding. No  hemodynamically significant or correctable stenosis. 2. 5 mm right upper lobe pulmonary nodule, indeterminate. Per Fleischner Society Guidelines, if patient is low risk for malignancy, no routine follow-up imaging is recommended. If patient is high risk for malignancy, a non-contrast Chest CT at 12 months is optional. If performed and the nodule is stable at 12 months, no further follow-up is recommended. These guidelines do not apply to immunocompromised patients and patients with cancer. Follow up in patients with significant comorbidities as clinically warranted. For lung cancer screening, adhere to Lung-RADS guidelines. Reference: Radiology. 2017; 284(1):228-43. Electronically Signed   By: Rise Mu M.D.   On: 04/08/2023 03:23    Procedures Procedures    Medications Ordered in ED Medications  iohexol (OMNIPAQUE) 350 MG/ML injection 75 mL (75 mLs Intravenous Contrast Given 04/08/23 0222)    ED Course/ Medical Decision Making/ A&P                                 Medical Decision Making Amount and/or Complexity of Data Reviewed Labs: ordered. Radiology: ordered.  Risk Prescription drug management.   With symptoms improving no stroke alert activated however main concern is bran lesions secondary to the symmetric visual loss in both eyes separately concerning for something behind the optic chiasm.  CTA done here which was reassuring would not show any acute stroke if that is what happened.  Patient symptoms resolved while in the ER and said his blood pressure.  Has been under some stress recently and may not be as hydrated is much as possible however I feel like if it was the blood pressure, stress or hydration levels it would not be just a peripheral field rather the whole I would be blurry and abnormal.  Did have a headache at the same time consider complex migraine versus aura with migraine however he states that the headache started in the back of his neck and radiate up to the  back of his head and this is similar to headaches he has had off-and-on over the last 2 weeks which are new.  CT angio did not show any vascular abnormalities on my interpretation.  After discussing further with the patient I recommended get an MRI just to rule out any acute lesions and if that is okay he can follow-up with his PCP.  Also gave him the option of outpatient TIA workup however we opted for the MRI at this time.  Discussed with Dr. Eudelia Bunch at [facility] who accepts him in transfer to the ED for MRI, reevaluation afterwards and ultimate disposition.  Of note, the patient has a pulmonary nodule.  He does have a remote history of smoking, current history of likely asthma although  sounds unclear from most recent pulmonology note.  He already has an appointment with his pulmonologist coming up within the next few weeks so he will discuss with him about the possible need for repeat imaging in 1 to 2 years as recommended. Will send Dr. Ellamae Sia a message so he is aware.    Final Clinical Impression(s) / ED Diagnoses Final diagnoses:  Pulmonary nodule  Vision changes    Rx / DC Orders ED Discharge Orders     None         Raydel Hosick, Barbara Cower, MD 04/08/23 [phone]

## 2023-04-09 LAB — CBG MONITORING, ED: Glucose-Capillary: 125 mg/dL — ABNORMAL HIGH (ref 70–99)

## 2023-04-13 ENCOUNTER — Encounter: Payer: Self-pay | Admitting: Neurology

## 2023-04-16 ENCOUNTER — Other Ambulatory Visit: Payer: Self-pay

## 2023-04-24 ENCOUNTER — Other Ambulatory Visit (HOSPITAL_COMMUNITY): Payer: Self-pay

## 2023-04-24 NOTE — Progress Notes (Unsigned)
NEUROLOGY CONSULTATION NOTE  Zachary Jacobs MRN: [id number] DOB: [birth year]  Referring provider: Georganna Skeans, MD Primary care provider: Georganna Skeans, MD  Reason for consult:  vision changes  Assessment/Plan:   Transient ischemic attack involving the left PCA territory  Advised to start ASA 81mg  daily for secondary stroke prevention Continue workup: Check 2D echo with Bubble study Check 2 week cardiac event monitor Check lipid panel Follow up 6 months.   Subjective:  Zachary Jacobs is a 67 year old right-handed male with OSA and prediaberes who presents for vision changes.  History supplemented by ED notes.  MRI and CTA personally reviewed.  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  Visual deficit (graying) noted by patient when closing either eye (lower nasal OS, upper and lower temporal OD).  Lasted about 15 minutes.  No headache or eye pain.  Blood pressure was in the 150s systolic.  He was seen in the ED where MRI of brain without contrast showed mild chronic small vessel ischemic changes but no acute findings.  CTA head and neck revealed no LVO or hemodynamically significant stenosis.  He was seen by his eye doctor a month prior for vitreous floaters.  May have a mild headache from time to time but no history of migraines or other significant headaches.     Hgb A1c from September was 6.1.   PAST MEDICAL HISTORY: Past Medical History:  Diagnosis Date   Allergy    Anemia    as child   COPD, mild (HCC)    very mild   History of kidney stones    OSA (obstructive sleep apnea)    s/p  surgery 1996   Pneumonia    Pre-diabetes    Renal calculus, left    Renal cyst, right    SIMPLE   Sleep apnea     cpap   dont wear latley    PAST SURGICAL HISTORY: Past Surgical History:  Procedure Laterality Date   CYSTO/  RIGHT URETEROSCOPY/  STENT PLACEMENT  10/ 2001    CYSTOSCOPY/URETEROSCOPY/HOLMIUM LASER/STENT PLACEMENT Left 03/30/2015   Procedure: LEFT URETEROSCOPY/HOLMIUM LASER LITHOTRIPSY/STENT PLACEMENT, WITH  STONE BASKET EXTRACTION;  Surgeon: Ihor Gully, MD;  Location: [facility];  Service: Urology;  Laterality: Left;   HOLMIUM LASER APPLICATION Left 03/30/2015   Procedure: HOLMIUM LASER APPLICATION;  Surgeon: Ihor Gully, MD;  Location[facility];  Service: Urology;  Laterality: Left;   IR URETERAL STENT LEFT NEW ACCESS W/O SEP NEPHROSTOMY CATH  05/17/2019   LEFT HEART CATH AND CORONARY ANGIOGRAPHY N/A 06/15/2020   Procedure: LEFT HEART CATH AND CORONARY ANGIOGRAPHY;  Surgeon: Orpah Cobb, MD;  Location: [facility];  Service: Cardiovascular;  Laterality: N/A;   NEPHROLITHOTOMY Left 03/20/2015   Procedure: NEPHROLITHOTOMY PERCUTANEOUS;  Surgeon: Ihor Gully, MD;  Location: [facility];  Service: Urology;  Laterality: Left;   NEPHROLITHOTOMY Left 05/17/2019   Procedure: NEPHROLITHOTOMY PERCUTANEOUS;  Surgeon: Ihor Gully, MD;  Location: [facility];  Service: Urology;  Laterality: Left;   TONSILLECTOMY     as child   UVULOPALATOPHARYNGOPLASTY  1996   nasal surgery    MEDICATIONS: Current Outpatient Medications on File Prior to Visit  Medication Sig Dispense Refill   colchicine 0.6 MG tablet Take 2 tablets initially and then 1 tablet 1 hour later. 3 tablet 0   finasteride (PROSCAR) 5 MG tablet Take 1 tablet (5 mg total)  by mouth daily. 90 tablet 3   Fluticasone-Umeclidin-Vilant (TRELEGY ELLIPTA) 200-62.5-25 MCG/ACT AEPB Inhale 1 puff into the lungs daily. 60 each 11   ibuprofen (ADVIL) 600 MG tablet Take 1 tablet (600 mg total) by mouth every 6 (six) hours as needed. 30 tablet 0   levalbuterol (XOPENEX HFA) 45 MCG/ACT inhaler Inhale 1 puff into the lungs every 6 (six) hours as needed for wheezing. 15 g 11   losartan (COZAAR) 25 MG tablet Take 1 tablet (25 mg total) by mouth daily. 90 tablet 3   nitroGLYCERIN  (NITROSTAT) 0.4 MG SL tablet Place 0.4 mg under the tongue every 5 (five) minutes as needed for chest pain.     pantoprazole (PROTONIX) 40 MG tablet Take 1 tablet (40 mg total) by mouth daily. 30 tablet 11   silodosin (RAPAFLO) 8 MG CAPS capsule Take 1 capsule (8 mg total) by mouth daily. 90 capsule 3   [DISCONTINUED] fluticasone (FLONASE) 50 MCG/ACT nasal spray Place 2 sprays into both nostrils daily. 16 g 0   No current facility-administered medications on file prior to visit.    ALLERGIES: No Known Allergies  FAMILY HISTORY: Family History  Problem Relation Age of Onset   Cancer Mother    Heart disease Father    Colon polyps Father    Cancer Other    Obesity Other    Colon cancer Neg Hx    Esophageal cancer Neg Hx    Rectal cancer Neg Hx    Stomach cancer Neg Hx     Objective:  Blood pressure 115/69, pulse 75, resp. rate 18, weight 271 lb (122.9 kg), SpO2 95%. General: No acute distress.  Patient appears well-groomed.   Head:  Normocephalic/atraumatic Eyes:  fundi examined but not visualized Heart: regular rate and rhythm Vascular: No carotid bruits. Neurological Exam: Mental status: alert and oriented to person, place, and time, speech fluent and not dysarthric, language intact. Cranial nerves: CN I: not tested CN II: pupils equal, round and reactive to light, visual fields intact CN III, IV, VI:  full range of motion, no nystagmus, no ptosis CN V: facial sensation intact. CN VII: upper and lower face symmetric CN VIII: hearing intact CN IX, X: gag intact, uvula midline CN XI: sternocleidomastoid and trapezius muscles intact CN XII: tongue midline Bulk & Tone: normal, no fasciculations. Motor:  muscle strength 5/5 throughout Sensation:  Pinprick sensation slightly reduced in right hand and vibratory sensation intact. Deep Tendon Reflexes:  2+ throughout,  toes downgoing.   Finger to nose testing:  Without dysmetria.   Heel to shin:  Without dysmetria.   Gait:   Normal station and stride.  Romberg negative.    Thank you for allowing me to take part in the care of this patient.  Shon Millet, DO  CC:  Georganna Skeans, MD

## 2023-04-27 ENCOUNTER — Other Ambulatory Visit: Payer: Commercial Managed Care - PPO

## 2023-04-27 ENCOUNTER — Ambulatory Visit: Payer: Commercial Managed Care - PPO | Admitting: Neurology

## 2023-04-27 ENCOUNTER — Encounter: Payer: Self-pay | Admitting: Neurology

## 2023-04-27 VITALS — BP 115/69 | HR 75 | Resp 18 | Wt 271.0 lb

## 2023-04-27 DIAGNOSIS — G459 Transient cerebral ischemic attack, unspecified: Secondary | ICD-10-CM | POA: Diagnosis not present

## 2023-04-27 NOTE — Patient Instructions (Addendum)
Start aspirin 81mg  daily Check lipid panel [address] today Check echocardiogram At [facility] Check 2 week cardiac event monitor at [facility] cone Further recommendations pending resul ts. Follow up 6 months.

## 2023-04-28 LAB — LIPID PANEL
Cholesterol: 148 mg/dL (ref <200)
HDL: 48 mg/dL (ref >=40)
LDL Cholesterol (Calc): 78 mg/dL
Non-HDL Cholesterol (Calc): 100 mg/dL (ref <130)
Total CHOL/HDL Ratio: 3.1 (calc) (ref <5.0)
Triglycerides: 122 mg/dL (ref <150)

## 2023-05-05 ENCOUNTER — Other Ambulatory Visit (HOSPITAL_COMMUNITY): Payer: Self-pay

## 2023-05-05 ENCOUNTER — Encounter: Payer: Self-pay | Admitting: Pulmonary Disease

## 2023-05-05 ENCOUNTER — Ambulatory Visit: Payer: Commercial Managed Care - PPO | Admitting: Pulmonary Disease

## 2023-05-05 VITALS — BP 114/76 | HR 60 | Temp 98.6°F | Ht 70.0 in | Wt 266.8 lb

## 2023-05-05 DIAGNOSIS — K219 Gastro-esophageal reflux disease without esophagitis: Secondary | ICD-10-CM | POA: Diagnosis not present

## 2023-05-05 DIAGNOSIS — R911 Solitary pulmonary nodule: Secondary | ICD-10-CM

## 2023-05-05 DIAGNOSIS — J4540 Moderate persistent asthma, uncomplicated: Secondary | ICD-10-CM | POA: Diagnosis not present

## 2023-05-05 MED ORDER — FLUTICASONE FUROATE-VILANTEROL 200-25 MCG/ACT IN AEPB
1.0000 | INHALATION_SPRAY | Freq: Every day | RESPIRATORY_TRACT | 6 refills | Status: AC
  Filled 2023-05-05: qty 60, 30d supply, fill #0
  Filled 2023-06-10: qty 60, 30d supply, fill #1
  Filled 2023-07-09: qty 60, 30d supply, fill #2
  Filled 2023-08-13: qty 60, 30d supply, fill #3
  Filled 2023-09-08: qty 60, 30d supply, fill #4
  Filled 2023-10-15: qty 60, 60d supply, fill #5
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
## 2023-05-05 NOTE — Patient Instructions (Signed)
 Nice to see you again  Will stay off the Trelegy  Try Breo 1 puff once a day  If too expensive please let me know we will look for an alternative  We will repeat a CT scan in the summer, June, will follow-up afterwards to discuss results and any next steps if needed  Return to clinic in 6 months or sooner as needed with Dr. Annella

## 2023-05-06 DIAGNOSIS — E7800 Pure hypercholesterolemia, unspecified: Secondary | ICD-10-CM

## 2023-05-07 ENCOUNTER — Telehealth: Payer: Self-pay

## 2023-05-07 ENCOUNTER — Other Ambulatory Visit (HOSPITAL_COMMUNITY): Payer: Self-pay

## 2023-05-07 MED ORDER — ROSUVASTATIN CALCIUM 5 MG PO TABS
5.0000 mg | ORAL_TABLET | Freq: Every day | ORAL | 5 refills | Status: DC
  Filled 2023-05-07: qty 30, 30d supply, fill #0
  Filled 2023-06-10: qty 30, 30d supply, fill #1
  Filled 2023-07-09: qty 30, 30d supply, fill #2
  Filled 2023-08-13: qty 30, 30d supply, fill #3
  Filled 2023-09-08: qty 30, 30d supply, fill #4
  Filled 2023-10-15: qty 30, 30d supply, fill #5

## 2023-05-07 NOTE — Telephone Encounter (Signed)
 Pt called an informed of lab results, medication and labs ordered

## 2023-05-07 NOTE — Telephone Encounter (Signed)
 See phone note

## 2023-05-07 NOTE — Telephone Encounter (Signed)
 Pt called in after hours and left a message. Returning our call

## 2023-05-13 ENCOUNTER — Telehealth: Payer: Commercial Managed Care - PPO | Admitting: Nurse Practitioner

## 2023-05-13 DIAGNOSIS — J4530 Mild persistent asthma, uncomplicated: Secondary | ICD-10-CM | POA: Diagnosis not present

## 2023-05-13 DIAGNOSIS — J101 Influenza due to other identified influenza virus with other respiratory manifestations: Secondary | ICD-10-CM | POA: Diagnosis not present

## 2023-05-13 MED ORDER — SPACER/AERO-HOLDING CHAMBERS DEVI: 1.0000 | Freq: Once | 1 refills | Status: AC | PRN

## 2023-05-13 MED ORDER — OSELTAMIVIR PHOSPHATE 75 MG PO CAPS: 75.0000 mg | ORAL_CAPSULE | Freq: Two times a day (BID) | ORAL | 0 refills | Status: AC

## 2023-05-13 NOTE — Progress Notes (Signed)
 Virtual Visit Consent   Zachary Jacobs, you are scheduled for a virtual visit with a [facility] provider today. Just as with appointments in the office, your consent must be obtained to participate. Your consent will be active for this visit and any virtual visit you may have with one of our providers in the next 365 days. If you have a MyChart account, a copy of this consent can be sent to you electronically.  As this is a virtual visit, video technology does not allow for your provider to perform a traditional examination. This may limit your provider's ability to fully assess your condition. If your provider identifies any concerns that need to be evaluated in person or the need to arrange testing (such as labs, EKG, etc.), we will make arrangements to do so. Although advances in technology are sophisticated, we cannot ensure that it will always work on either your end or our end. If the connection with a video visit is poor, the visit may have to be switched to a telephone visit. With either a video or telephone visit, we are not always able to ensure that we have a secure connection.  By engaging in this virtual visit, you consent to the provision of healthcare and authorize for your insurance to be billed (if applicable) for the services provided during this visit. Depending on your insurance coverage, you may receive a charge related to this service.  I need to obtain your verbal consent now. Are you willing to proceed with your visit today? Zachary Jacobs has provided verbal consent on 05/13/2023 for a virtual visit (video or telephone). Mardene Shake, FNP  Date: 05/13/2023 4:33 PM  Virtual Visit via Video Note   I, Mardene Shake, connected with  Zachary Jacobs  ([id number], May 17, 1955) on 05/13/23 at  5:45 PM EST by a video-enabled telemedicine application and verified that I am speaking with the correct person using two identifiers.  Location: Patient: Virtual Visit Location Patient:  Home Provider: Virtual Visit Location Provider: Home Office   I discussed the limitations of evaluation and management by telemedicine and the availability of in person appointments. The patient expressed understanding and agreed to proceed.    History of Present Illness: Zachary Jacobs is a 68 y.o. who identifies as a male who was assigned male at birth, and is being seen today after testing positive for the flu (A) with a home test   Symptoms started yesterday  Symptoms include nasal congestion, PND and a cough  Mild headache low grade fever   He has had the flu vaccine this year  Denies nausea  Problems:  Patient Active Problem List   Diagnosis Date Noted   Asthma 05/10/2022   Prediabetes 07/12/2020   Unstable angina (HCC) 06/15/2020   Abnormal electrocardiogram during exercise stress test 06/15/2020   Incomplete left bundle branch block 05/31/2020   Lower urinary tract symptoms (LUTS) 03/13/2017   History of kidney stones 03/13/2017   Staghorn renal calculus 03/20/2015   Hydronephrosis     Allergies: No Known Allergies Medications:  Current Outpatient Medications:    finasteride  (PROSCAR ) 5 MG tablet, Take 1 tablet (5 mg total) by mouth daily., Disp: 90 tablet, Rfl: 3   fluticasone  furoate-vilanterol (BREO ELLIPTA ) 200-25 MCG/ACT AEPB, Inhale 1 puff into the lungs daily., Disp: 60 each, Rfl: 6   ibuprofen  (ADVIL ) 600 MG tablet, Take 1 tablet (600 mg total) by mouth every 6 (six) hours as needed., Disp: 30 tablet, Rfl: 0  levalbuterol  (XOPENEX  HFA) 45 MCG/ACT inhaler, Inhale 1 puff into the lungs every 6 (six) hours as needed for wheezing., Disp: 15 g, Rfl: 11   losartan  (COZAAR ) 25 MG tablet, Take 1 tablet (25 mg total) by mouth daily., Disp: 90 tablet, Rfl: 3   pantoprazole  (PROTONIX ) 40 MG tablet, Take 1 tablet (40 mg total) by mouth daily., Disp: 30 tablet, Rfl: 11   rosuvastatin  (CRESTOR ) 5 MG tablet, Take 1 tablet (5 mg total) by mouth daily., Disp: 30 tablet, Rfl:  5   silodosin  (RAPAFLO ) 8 MG CAPS capsule, Take 1 capsule (8 mg total) by mouth daily., Disp: 90 capsule, Rfl: 3  Observations/Objective: Patient is well-developed, well-nourished in no acute distress.  Resting comfortably  at home.  Head is normocephalic, atraumatic.  No labored breathing.  Speech is clear and coherent with logical content.  Patient is alert and oriented at baseline.    Assessment and Plan:  1. Influenza A (Primary) Take anti viral with food  Continue to manage symptoms with over the counter medications  - oseltamivir  (TAMIFLU ) 75 MG capsule; Take 1 capsule (75 mg total) by mouth 2 (two) times daily for 5 days.  Dispense: 10 capsule; Refill: 0  2. Mild persistent asthma, unspecified whether complicated Patient requested spacer for inhaler use   - Spacer/Aero-Holding Chambers DEVI; 1 Device by Does not apply route once as needed for up to 1 dose (to use with rescue inhaler).  Dispense: 1 Canister; Refill: 1    Follow Up Instructions: I discussed the assessment and treatment plan with the patient. The patient was provided an opportunity to ask questions and all were answered. The patient agreed with the plan and demonstrated an understanding of the instructions.  A copy of instructions were sent to the patient via MyChart unless otherwise noted below.    The patient was advised to call back or seek an in-person evaluation if the symptoms worsen or if the condition fails to improve as anticipated.    Mardene Shake, FNP

## 2023-06-10 ENCOUNTER — Other Ambulatory Visit (HOSPITAL_COMMUNITY): Payer: Self-pay

## 2023-06-26 ENCOUNTER — Ambulatory Visit
Admission: RE | Admit: 2023-06-26 | Discharge: 2023-06-26 | Disposition: A | Discharge status: Home / Self Care | Payer: Commercial Managed Care - PPO | Source: Ambulatory Visit | Attending: Family Medicine | Admitting: Family Medicine

## 2023-06-26 VITALS — BP 118/80 | HR 64 | Temp 98.6°F | Resp 14

## 2023-06-26 DIAGNOSIS — H6991 Unspecified Eustachian tube disorder, right ear: Secondary | ICD-10-CM | POA: Diagnosis not present

## 2023-06-26 MED ORDER — METHYLPREDNISOLONE 4 MG PO TBPK
ORAL_TABLET | ORAL | 0 refills | Status: DC
Start: 2023-06-26 — End: 2023-10-26

## 2023-06-26 NOTE — ED Provider Notes (Signed)
 [facility]    CSN: [id number] Arrival date & time: 06/26/23  1343      History   Chief Complaint Chief Complaint  Patient presents with   Ear Fullness    Right ears feels full.  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 - Entered by patient    HPI Zachary Jacobs is a 68 y.o. male.    Ear Fullness  Patient is here for right ear fullness for the last month, intermittent, but has been more constant the last week.   No pain, but having pressure.  He did remove some ear wax earlier this month.  No runny nose, congestion, drainage.        Past Medical History:  Diagnosis Date   Allergy    Anemia    as child   COPD, mild (HCC)    very mild   History of kidney stones    OSA (obstructive sleep apnea)    s/p  surgery 1996   Pneumonia    Pre-diabetes    Renal calculus, left    Renal cyst, right    SIMPLE   Sleep apnea     cpap   dont wear latley    Patient Active Problem List   Diagnosis Date Noted   Asthma 05/10/2022   Prediabetes 07/12/2020   Unstable angina (HCC) 06/15/2020   Abnormal electrocardiogram during exercise stress test 06/15/2020   Incomplete left bundle branch block 05/31/2020   Lower urinary tract symptoms (LUTS) 03/13/2017   History of kidney stones 03/13/2017   Staghorn renal calculus 03/20/2015   Hydronephrosis     Past Surgical History:  Procedure Laterality Date   CYSTO/  RIGHT URETEROSCOPY/  STENT PLACEMENT  10/ 2001   CYSTOSCOPY/URETEROSCOPY/HOLMIUM LASER/STENT PLACEMENT Left 03/30/2015   Procedure: LEFT URETEROSCOPY/HOLMIUM LASER LITHOTRIPSY/STENT PLACEMENT, WITH  STONE BASKET EXTRACTION;  Surgeon: Ihor Gully, MD;  Location: [facility] [place];  Service: Urology;  Laterality: Left;   HOLMIUM LASER APPLICATION Left 03/30/2015   Procedure: HOLMIUM LASER APPLICATION;  Surgeon: Ihor Gully, MD;  Location: [facility];  Service: Urology;  Laterality: Left;   IR URETERAL STENT  LEFT NEW ACCESS W/O SEP NEPHROSTOMY CATH  05/17/2019   LEFT HEART CATH AND CORONARY ANGIOGRAPHY N/A 06/15/2020   Procedure: LEFT HEART CATH AND CORONARY ANGIOGRAPHY;  Surgeon: Orpah Cobb, MD;  Location: [facility];  Service: Cardiovascular;  Laterality: N/A;   NEPHROLITHOTOMY Left 03/20/2015   Procedure: NEPHROLITHOTOMY PERCUTANEOUS;  Surgeon: Ihor Gully, MD;  Location: [facility];  Service: Urology;  Laterality: Left;   NEPHROLITHOTOMY Left 05/17/2019   Procedure: NEPHROLITHOTOMY PERCUTANEOUS;  Surgeon: Ihor Gully, MD;  Location: [facility];  Service: Urology;  Laterality: Left;   TONSILLECTOMY     as child   UVULOPALATOPHARYNGOPLASTY  1996   nasal surgery       Home Medications    Prior to Admission medications   Medication Sig Start Date End Date Taking? Authorizing Provider  aspirin EC 81 MG tablet Take 81 mg by mouth daily. Swallow whole.   Yes [provider]  finasteride (PROSCAR) 5 MG tablet Take 1 tablet (5 mg total) by mouth daily. 09/18/22  Yes   fluticasone furoate-vilanterol (BREO ELLIPTA) 200-25 MCG/ACT AEPB Inhale 1 puff into the lungs daily. 05/05/23  Yes Hunsucker, Lesia Sago, MD  ibuprofen (ADVIL) 600 MG tablet Take 1 tablet (600 mg total) by mouth every 6 (six) hours as needed. 05/29/22  Yes  Ellsworth Lennox, PA-C  pantoprazole (PROTONIX) 40 MG tablet Take 1 tablet (40 mg total) by mouth daily. 08/25/22  Yes Hunsucker, Lesia Sago, MD  rosuvastatin (CRESTOR) 5 MG tablet Take 1 tablet (5 mg total) by mouth daily. 05/07/23  Yes Everlena Cooper, Adam R, DO  silodosin (RAPAFLO) 8 MG CAPS capsule Take 1 capsule (8 mg total) by mouth daily. 08/08/22  Yes   Spacer/Aero-Holding Deretha Emory DEVI 1 Device by Does not apply route once as needed for up to 1 dose (to use with rescue inhaler). 05/13/23  Yes Viviano Simas, FNP  fluticasone (FLONASE) 50 MCG/ACT nasal spray Place into the nose. 10/12/20   [provider]  levalbuterol (XOPENEX HFA) 45 MCG/ACT inhaler Inhale 1 puff into the  lungs every 6 (six) hours as needed for wheezing. 05/10/22   Viviano Simas, FNP  losartan (COZAAR) 25 MG tablet Take 1 tablet (25 mg total) by mouth daily. Patient not taking: Reported on 06/26/2023 03/26/21       Family History Family History  Problem Relation Age of Onset   Cancer Mother    Heart disease Father    Colon polyps Father    Cancer Other    Obesity Other    Colon cancer Neg Hx    Esophageal cancer Neg Hx    Rectal cancer Neg Hx    Stomach cancer Neg Hx     Social History Social History   Tobacco Use   Smoking status: Former    Current packs/day: 0.00    Average packs/day: 1 pack/day for 20.0 years (20.0 ttl pk-yrs)    Types: Cigarettes    Start date: 03/27/1964    Quit date: 03/27/1984    Years since quitting: 39.2   Smokeless tobacco: Never  Vaping Use   Vaping status: Never Used  Substance Use Topics   Alcohol use: No   Drug use: No     Allergies   Patient has no known allergies.   Review of Systems Review of Systems  Constitutional: Negative.   HENT:  Positive for hearing loss. Negative for congestion and rhinorrhea.   Respiratory: Negative.    Cardiovascular: Negative.   Gastrointestinal: Negative.   Musculoskeletal: Negative.   Psychiatric/Behavioral: Negative.       Physical Exam Triage Vital Signs ED Triage Vitals  Encounter Vitals Group     BP 06/26/23 1417 118/80     Systolic BP Percentile --      Diastolic BP Percentile --      Pulse Rate 06/26/23 1417 64     Resp 06/26/23 1417 14     Temp 06/26/23 1417 98.6 F (37 C)     Temp Source 06/26/23 1417 Oral     SpO2 06/26/23 1417 96 %     Weight --      Height --      Head Circumference --      Peak Flow --      Pain Score 06/26/23 1418 0     Pain Loc --      Pain Education --      Exclude from Growth Chart --    No data found.  Updated Vital Signs BP 118/80 (BP Location: Left Arm)   Pulse 64   Temp 98.6 F (37 C) (Oral)   Resp 14   SpO2 96%   Visual Acuity Right  Eye Distance:   Left Eye Distance:   Bilateral Distance:    Right Eye Near:   Left Eye Near:  Bilateral Near:     Physical Exam Constitutional:      General: He is not in acute distress.    Appearance: Normal appearance. He is normal weight. He is not ill-appearing.  HENT:     Right Ear: A middle ear effusion is present.     Left Ear: Tympanic membrane normal.  Musculoskeletal:     Cervical back: Normal range of motion and neck supple. No tenderness.  Neurological:     General: No focal deficit present.     Mental Status: He is alert.  Psychiatric:        Mood and Affect: Mood normal.      UC Treatments / Results  Labs (all labs ordered are listed, but only abnormal results are displayed) Labs Reviewed - No data to display  EKG   Radiology No results found.  Procedures Procedures (including critical care time)  Medications Ordered in UC Medications - No data to display  Initial Impression / Assessment and Plan / UC Course  I have reviewed the triage vital signs and the nursing notes.  Pertinent labs & imaging results that were available during my care of the patient were reviewed by me and considered in my medical decision making (see chart for details).   Final Clinical Impressions(s) / UC Diagnoses   Final diagnoses:  Eustachian tube dysfunction, right     Discharge Instructions      You were seen today for ear fullness.  There is no wax and no infection, but does appear to have fluid behind the ear.  I recommend you use claritin or zyrtec, add flonase, and I have sent an oral prednisone to your pharmacy as well.  If you continue with symptoms you may need to see an ENT for further evaluation.     ED Prescriptions     Medication Sig Dispense Auth. Provider   methylPREDNISolone (MEDROL DOSEPAK) 4 MG TBPK tablet Take as directed 1 each Jannifer Franklin, MD      PDMP not reviewed this encounter.   Jannifer Franklin, MD 06/26/23 [phone]

## 2023-06-26 NOTE — ED Triage Notes (Signed)
 Pt reports worsening R ear fullness x1 week. Reports he had it intermittently for the last month, but it has become almost daily and constant. Pt removed earwax from the ear this past month, but states it feels like there is something in his ear. Notes he occasionally feels pressure but not really ear pain. States it sometimes feels like the R ear is numb.

## 2023-06-26 NOTE — Discharge Instructions (Signed)
 You were seen today for ear fullness.  There is no wax and no infection, but does appear to have fluid behind the ear.  I recommend you use claritin or zyrtec, add flonase, and I have sent an oral prednisone to your pharmacy as well.  If you continue with symptoms you may need to see an ENT for further evaluation.

## 2023-07-02 ENCOUNTER — Ambulatory Visit: Admitting: Family Medicine

## 2023-07-02 VITALS — BP 121/77 | HR 52 | Temp 97.8°F | Resp 16 | Ht 70.0 in | Wt 264.0 lb

## 2023-07-02 DIAGNOSIS — J4530 Mild persistent asthma, uncomplicated: Secondary | ICD-10-CM | POA: Diagnosis not present

## 2023-07-02 DIAGNOSIS — H9201 Otalgia, right ear: Secondary | ICD-10-CM

## 2023-07-02 NOTE — Progress Notes (Signed)
 Having problems with my right ear fullness. Water behind ear drum  Patient request refer to ENT

## 2023-07-02 NOTE — Progress Notes (Signed)
 Established Patient Office Visit  Subjective    [name], male    DOB: 09-19-1955  Age: 68 y.o. MRN: [id number]  CC:  Chief Complaint  Patient presents with   Medical Management of Chronic Issues    HPI Zachary Jacobs presents for right ear pressure for a couple of weeks that is worsening. UC visit was given steroids that improved sx but they have recurred. Denies known trauma or injury.   Outpatient Encounter Medications as of 07/02/2023  Medication Sig   aspirin EC 81 MG tablet Take 81 mg by mouth daily. Swallow whole.   finasteride (PROSCAR) 5 MG tablet Take 1 tablet (5 mg total) by mouth daily.   fluticasone (FLONASE) 50 MCG/ACT nasal spray Place into the nose.   fluticasone furoate-vilanterol (BREO ELLIPTA) 200-25 MCG/ACT AEPB Inhale 1 puff into the lungs daily.   ibuprofen (ADVIL) 600 MG tablet Take 1 tablet (600 mg total) by mouth every 6 (six) hours as needed.   levalbuterol (XOPENEX HFA) 45 MCG/ACT inhaler Inhale 1 puff into the lungs every 6 (six) hours as needed for wheezing.   methylPREDNISolone (MEDROL DOSEPAK) 4 MG TBPK tablet Take as directed   pantoprazole (PROTONIX) 40 MG tablet Take 1 tablet (40 mg total) by mouth daily.   rosuvastatin (CRESTOR) 5 MG tablet Take 1 tablet (5 mg total) by mouth daily.   silodosin (RAPAFLO) 8 MG CAPS capsule Take 1 capsule (8 mg total) by mouth daily.   Spacer/Aero-Holding Chambers DEVI 1 Device by Does not apply route once as needed for up to 1 dose (to use with rescue inhaler).   losartan (COZAAR) 25 MG tablet Take 1 tablet (25 mg total) by mouth daily. (Patient not taking: Reported on 07/02/2023)   No facility-administered encounter medications on file as of 07/02/2023.    Past Medical History:  Diagnosis Date   Allergy    Anemia    as child   COPD, mild (HCC)    very mild   History of kidney stones    OSA (obstructive sleep apnea)    s/p  surgery 1996   Pneumonia    Pre-diabetes    Renal calculus,  left    Renal cyst, right    SIMPLE   Sleep apnea     cpap   dont wear latley    Past Surgical History:  Procedure Laterality Date   CYSTO/  RIGHT URETEROSCOPY/  STENT PLACEMENT  10/ 2001   CYSTOSCOPY/URETEROSCOPY/HOLMIUM LASER/STENT PLACEMENT Left 03/30/2015   Procedure: LEFT URETEROSCOPY/HOLMIUM LASER LITHOTRIPSY/STENT PLACEMENT, WITH  STONE BASKET EXTRACTION;  Surgeon: Ihor Gully, MD;  Location: [facility] [place];  Service: Urology;  Laterality: Left;   HOLMIUM LASER APPLICATION Left 03/30/2015   Procedure: HOLMIUM LASER APPLICATION;  Surgeon: Ihor Gully, MD;  Location: [facility];  Service: Urology;  Laterality: Left;   IR URETERAL STENT LEFT NEW ACCESS W/O SEP NEPHROSTOMY CATH  05/17/2019   LEFT HEART CATH AND CORONARY ANGIOGRAPHY N/A 06/15/2020   Procedure: LEFT HEART CATH AND CORONARY ANGIOGRAPHY;  Surgeon: Orpah Cobb, MD;  Location: [facility];  Service: Cardiovascular;  Laterality: N/A;   NEPHROLITHOTOMY Left 03/20/2015   Procedure: NEPHROLITHOTOMY PERCUTANEOUS;  Surgeon: Ihor Gully, MD;  Location: [facility];  Service: Urology;  Laterality: Left;   NEPHROLITHOTOMY Left 05/17/2019   Procedure: NEPHROLITHOTOMY PERCUTANEOUS;  Surgeon: Ihor Gully, MD;  Location: [facility];  Service: Urology;  Laterality: Left;   TONSILLECTOMY     as child  UVULOPALATOPHARYNGOPLASTY  1996   nasal surgery    Family History  Problem Relation Age of Onset   Cancer Mother    Heart disease Father    Colon polyps Father    Cancer Other    Obesity Other    Colon cancer Neg Hx    Esophageal cancer Neg Hx    Rectal cancer Neg Hx    Stomach cancer Neg Hx     Social History   Socioeconomic History   Marital status: Married    Spouse name: Not on file   Number of children: Not on file   Years of education: Not on file   Highest education level: Associate degree: occupational, Scientist, product/process development, or vocational program  Occupational History   Not on file  Tobacco Use    Smoking status: Former    Current packs/day: 0.00    Average packs/day: 1 pack/day for 20.0 years (20.0 ttl pk-yrs)    Types: Cigarettes    Start date: 03/27/1964    Quit date: 03/27/1984    Years since quitting: 39.3   Smokeless tobacco: Never  Vaping Use   Vaping status: Never Used  Substance and Sexual Activity   Alcohol use: No   Drug use: No   Sexual activity: Not Currently  Other Topics Concern   Not on file  Social History Narrative   Right handed    Drinks caffeine   Lives with wife sara   Currently employed   Two floor home   Social Drivers of Health   Financial Resource Strain: Low Risk  (06/30/2023)   Overall Financial Resource Strain (CARDIA)    Difficulty of Paying Living Expenses: Not hard at all  Food Insecurity: No Food Insecurity (06/30/2023)   Hunger Vital Sign    Worried About Running Out of Food in the Last Year: Never true    Ran Out of Food in the Last Year: Never true  Transportation Needs: No Transportation Needs (06/30/2023)   PRAPARE - Administrator, Civil Service (Medical): No    Lack of Transportation (Non-Medical): No  Physical Activity: Insufficiently Active (06/30/2023)   Exercise Vital Sign    Days of Exercise per Week: 1 day    Minutes of Exercise per Session: 10 min  Stress: Stress Concern Present (06/30/2023)   [employer] of Occupational Health - Occupational Stress Questionnaire    Feeling of Stress : To some extent  Social Connections: Socially Integrated (06/30/2023)   Social Connection and Isolation Panel [NHANES]    Frequency of Communication with Friends and Family: Twice a week    Frequency of Social Gatherings with Friends and Family: Once a week    Attends Religious Services: 1 to 4 times per year    Active Member of [employer] or Organizations: Yes    Attends Banker Meetings: 1 to 4 times per year    Marital Status: Married  Catering manager Violence: Not on file    Review of Systems  All other  systems reviewed and are negative.       Objective    BP 121/77   Pulse (!) 52   Temp 97.8 F (36.6 C) (Oral)   Resp 16   Ht 5\' 10"  (1.778 m)   Wt 264 lb (119.7 kg)   BMI 37.88 kg/m   Physical Exam Vitals and nursing note reviewed.  Constitutional:      General: He is not in acute distress. HENT:     Right Ear: Tympanic membrane  and ear canal normal.     Left Ear: Tympanic membrane and ear canal normal.  Cardiovascular:     Rate and Rhythm: Normal rate and regular rhythm.  Pulmonary:     Effort: Pulmonary effort is normal.     Breath sounds: Normal breath sounds.  Neurological:     General: No focal deficit present.     Mental Status: He is alert and oriented to person, place, and time.         Assessment & Plan:   Right ear pain -     Ambulatory referral to ENT  Mild persistent asthma, unspecified whether complicated     Return if symptoms worsen or fail to improve.   Tommie Raymond, MD

## 2023-07-06 ENCOUNTER — Encounter: Payer: Self-pay | Admitting: Family Medicine

## 2023-07-09 ENCOUNTER — Other Ambulatory Visit (HOSPITAL_COMMUNITY): Payer: Self-pay

## 2023-07-28 ENCOUNTER — Ambulatory Visit: Admitting: Family Medicine

## 2023-08-13 ENCOUNTER — Other Ambulatory Visit (HOSPITAL_COMMUNITY): Payer: Self-pay

## 2023-09-08 ENCOUNTER — Other Ambulatory Visit (HOSPITAL_COMMUNITY): Payer: Self-pay

## 2023-09-08 ENCOUNTER — Encounter (INDEPENDENT_AMBULATORY_CARE_PROVIDER_SITE_OTHER): Payer: Self-pay | Admitting: Otolaryngology

## 2023-09-08 ENCOUNTER — Ambulatory Visit (INDEPENDENT_AMBULATORY_CARE_PROVIDER_SITE_OTHER): Admitting: Otolaryngology

## 2023-09-08 VITALS — BP 131/82 | HR 61 | Ht 70.0 in | Wt 255.0 lb

## 2023-09-08 DIAGNOSIS — H6991 Unspecified Eustachian tube disorder, right ear: Secondary | ICD-10-CM | POA: Diagnosis not present

## 2023-09-08 DIAGNOSIS — K429 Umbilical hernia without obstruction or gangrene: Secondary | ICD-10-CM | POA: Diagnosis not present

## 2023-09-08 DIAGNOSIS — N200 Calculus of kidney: Secondary | ICD-10-CM | POA: Diagnosis not present

## 2023-09-08 DIAGNOSIS — N281 Cyst of kidney, acquired: Secondary | ICD-10-CM | POA: Diagnosis not present

## 2023-09-08 DIAGNOSIS — H938X1 Other specified disorders of right ear: Secondary | ICD-10-CM

## 2023-09-08 MED ORDER — AZELASTINE HCL 0.1 % NA SOLN
2.0000 | Freq: Two times a day (BID) | NASAL | 12 refills | Status: DC
  Filled 2023-09-08: qty 30, 25d supply, fill #0
  Filled 2023-10-15: qty 30, 25d supply, fill #1
  Filled 2023-11-15: qty 30, 25d supply, fill #2
  Filled 2024-01-12: qty 30, 25d supply, fill #3
  Filled 2024-02-18: qty 30, 25d supply, fill #4
  Filled 2024-05-13: qty 30, 25d supply, fill #5
  Filled 2024-05-16: qty 30, 25d supply, fill #0

## 2023-09-08 MED ORDER — AZELASTINE HCL 0.1 % NA SOLN: 2.0000 | Freq: Two times a day (BID) | NASAL | 12 refills | Status: DC

## 2023-09-08 NOTE — Progress Notes (Signed)
 Dear Dr. Elvan Hamel, Here is my assessment for our mutual patient, Zachary Jacobs. Thank you for allowing me the opportunity to care for your patient. Please do not hesitate to contact me should you have any other questions. Sincerely, Dr. Milon Aloe  Otolaryngology Clinic Note Referring provider: Dr. Elvan Hamel HPI:  Zachary Jacobs is a 68 y.o. male kindly referred by Dr. Elvan Hamel for evaluation of right ear pain.  Initial visit (08/2023): Patient reports: he reports that his symptoms started about 3 months ago when he had pressure in right ear. Did not feel sick, but reports he had some runny nose. Given steroids, and it helped. Saw PCP in march who referred him. Current symptoms include ear pressure and post-auricular pressure few times a week (lasts most of the day), feels like when he is in a airplane. Intermittent bilateral tinnitus. Hearing is not really muffled. Intermittent non-pulsatile tinnitus. No popping/crackling. Overall, ear has slowly improved. Can pop his ears. Patient denies: ear pain, vertigo, drainage Patient also denies barotrauma, vestibular suppressant use, ototoxic medication use Prior ear surgery: no Normally no issues with ears. No frequent ear infections.  No sinonasal symptoms except Some AR symptoms - takes flonase  and PO antihistamine. Seems to help.  GERD on protonix .   H&N Surgery: UPPP and tonsillectomy Personal or FHx of bleeding dz or anesthesia difficulty: no   GLP-1: no AP/AC: ASA 81  Tobacco: former, quit 80s  PMHx: COPD, OSA, Angina, Bronchitis, Asthma  Independent Review of Additional Tests or Records:  05/13/2023 Mardene Shake visit - positive for flu with URI sx; Dx: Flu A, Rx: Tamiflu , supportive care 06/26/2023: Dr. Brannon Calamity ([facility]): right ear fullness, no pain, no URI sx; Dx: ETD; Rx: Medrol  park Dr, Elvan Hamel 07/02/2023: continued right ear pressure, improved but recurred now; ref to ENT CBC and CMP 04/08/2023: WBC 10.5, Eos 100; BuN/Cr 15/0.99 CT Angio  04/08/2023 independently interpreted with respect to ears: cuts thick so suboptimal but mastoids and mE well aerated; no noted otic capsule or OC chain pathology PMH/Meds/All/SocHx/FamHx/ROS:   Past Medical History:  Diagnosis Date   Allergy    Anemia    as child   COPD, mild (HCC)    very mild   History of kidney stones    OSA (obstructive sleep apnea)    s/p  surgery 1996   Pneumonia    Pre-diabetes    Renal calculus, left    Renal cyst, right    SIMPLE   Sleep apnea     cpap   dont wear latley     Past Surgical History:  Procedure Laterality Date   CYSTO/  RIGHT URETEROSCOPY/  STENT PLACEMENT  10/ 2001   CYSTOSCOPY/URETEROSCOPY/HOLMIUM LASER/STENT PLACEMENT Left 03/30/2015   Procedure: LEFT URETEROSCOPY/HOLMIUM LASER LITHOTRIPSY/STENT PLACEMENT, WITH  STONE BASKET EXTRACTION;  Surgeon: Mark Ottelin, MD;  Location: [facility] [place];  Service: Urology;  Laterality: Left;   HOLMIUM LASER APPLICATION Left 03/30/2015   Procedure: HOLMIUM LASER APPLICATION;  Surgeon: Mark Ottelin, MD;  Location: [facility];  Service: Urology;  Laterality: Left;   IR URETERAL STENT LEFT NEW ACCESS W/O SEP NEPHROSTOMY CATH  05/17/2019   LEFT HEART CATH AND CORONARY ANGIOGRAPHY N/A 06/15/2020   Procedure: LEFT HEART CATH AND CORONARY ANGIOGRAPHY;  Surgeon: Pasqual Bone, MD;  Location: [facility];  Service: Cardiovascular;  Laterality: N/A;   NEPHROLITHOTOMY Left 03/20/2015   Procedure: NEPHROLITHOTOMY PERCUTANEOUS;  Surgeon: Mark Ottelin, MD;  Location: [facility];  Service: Urology;  Laterality: Left;  NEPHROLITHOTOMY Left 05/17/2019   Procedure: NEPHROLITHOTOMY PERCUTANEOUS;  Surgeon: Ottelin, Mark, MD;  Location: [facility];  Service: Urology;  Laterality: Left;   TONSILLECTOMY     as child   UVULOPALATOPHARYNGOPLASTY  1996   nasal surgery    Family History  Problem Relation Age of Onset   Cancer Mother    Heart disease Father    Colon polyps Father    Cancer Other     Obesity Other    Colon cancer Neg Hx    Esophageal cancer Neg Hx    Rectal cancer Neg Hx    Stomach cancer Neg Hx      Social Connections: Socially Integrated (06/30/2023)   Social Connection and Isolation Panel [NHANES]    Frequency of Communication with Friends and Family: Twice a week    Frequency of Social Gatherings with Friends and Family: Once a week    Attends Religious Services: 1 to 4 times per year    Active Member of [employer] or Organizations: Yes    Attends Banker Meetings: 1 to 4 times per year    Marital Status: Married      Current Outpatient Medications:    aspirin  EC 81 MG tablet, Take 81 mg by mouth daily. Swallow whole., Disp: , Rfl:    finasteride  (PROSCAR ) 5 MG tablet, Take 1 tablet (5 mg total) by mouth daily., Disp: 90 tablet, Rfl: 3   fluticasone  (FLONASE ) 50 MCG/ACT nasal spray, Place into the nose., Disp: , Rfl:    fluticasone  furoate-vilanterol (BREO ELLIPTA ) 200-25 MCG/ACT AEPB, Inhale 1 puff into the lungs daily., Disp: 60 each, Rfl: 6   ibuprofen  (ADVIL ) 600 MG tablet, Take 1 tablet (600 mg total) by mouth every 6 (six) hours as needed., Disp: 30 tablet, Rfl: 0   levalbuterol  (XOPENEX  HFA) 45 MCG/ACT inhaler, Inhale 1 puff into the lungs every 6 (six) hours as needed for wheezing., Disp: 15 g, Rfl: 11   pantoprazole  (PROTONIX ) 40 MG tablet, Take 1 tablet (40 mg total) by mouth daily., Disp: 30 tablet, Rfl: 11   rosuvastatin  (CRESTOR ) 5 MG tablet, Take 1 tablet (5 mg total) by mouth daily., Disp: 30 tablet, Rfl: 5   silodosin  (RAPAFLO ) 8 MG CAPS capsule, Take 1 capsule (8 mg total) by mouth daily., Disp: 90 capsule, Rfl: 3   Spacer/Aero-Holding Chambers DEVI, 1 Device by Does not apply route once as needed for up to 1 dose (to use with rescue inhaler)., Disp: 1 Canister, Rfl: 1   azelastine  (ASTELIN ) 0.1 % nasal spray, Place 2 sprays into both nostrils 2 (two) times daily., Disp: 30 mL, Rfl: 12   losartan  (COZAAR ) 25 MG tablet, Take 1 tablet (25  mg total) by mouth daily. (Patient not taking: Reported on 09/08/2023), Disp: 90 tablet, Rfl: 3   methylPREDNISolone  (MEDROL  DOSEPAK) 4 MG TBPK tablet, Take as directed (Patient not taking: Reported on 09/08/2023), Disp: 1 each, Rfl: 0   Physical Exam:   BP 131/82 (BP Location: Left Arm, Patient Position: Sitting, Cuff Size: Large)   Pulse 61   Ht 5\' 10"  (1.778 m)   Wt 255 lb (115.7 kg)   SpO2 96%   BMI 36.59 kg/m   Salient findings:  CN II-XII intact Bilateral EAC clear and TM intact with modest global retraction, no obvious effusion noted today; Given history and complaints, ear microscopy was indicated and performed for evaluation with findings as below in physical exam section and in procedures Weber 512: mid Rinne 512: AC > BC  b/l  Anterior rhinoscopy: Septum intact; bilateral inferior turbinates without significant hypertrophy No lesions of oral cavity/oropharynx No obviously palpable neck masses/lymphadenopathy/thyromegaly No respiratory distress or stridor  Seprately Identifiable Procedures:  Prior to initiating any procedures, risks/benefits/alternatives were explained to the patient and verbal consent obtained. Procedure: Bilateral ear microscopy using microscope (CPT [phone]) Pre-procedure diagnosis: eustachian tube dysfunction, ear fullness Post-procedure diagnosis: same Indication: see above; given patient's otologic complaints and history, for improved and comprehensive examination of external ear and tympanic membrane, bilateral otologic examination using microscope was performed  Procedure: Patient was placed semi-recumbent. Both ear canals were examined using the microscope with findings above.  Patient tolerated the procedure well.   Impression & Plans:  Lamarco Gudiel is a 68 y.o. male with:  1. ETD (Eustachian tube dysfunction), right   2. Sensation of fullness in right ear    Right ear fullness and pressure after Flu; tried flonase  and medrol , initially  helped but now recurring sx. We discussed options including medical management, and right tymp tube. Pt opted for medical mgmt Still needs audio - will order Flonase  and astelin  BID F/u in 3 months, sooner if necessary   See below regarding exact medications prescribed this encounter including dosages and route: Meds ordered this encounter  Medications   DISCONTD: azelastine  (ASTELIN ) 0.1 % nasal spray    Sig: Place 2 sprays into both nostrils 2 (two) times daily. Use in each nostril as directed    Dispense:  30 mL    Refill:  12   azelastine  (ASTELIN ) 0.1 % nasal spray    Sig: Place 2 sprays into both nostrils 2 (two) times daily.    Dispense:  30 mL    Refill:  12      Thank you for allowing me the opportunity to care for your patient. Please do not hesitate to contact me should you have any other questions.  Sincerely, Milon Aloe, MD Otolaryngologist (ENT), [facility] Phone: [phone] Fax: [phone]  09/23/2023, 12:48 PM   MDM:  Level 4 - [phone] Complexity/Problems addressed: low - chronic issue Data complexity: mod - independent review of notes, labs, independent CT interpretation - Morbidity: mod  - Drug prescribed or managed: y

## 2023-09-08 NOTE — Patient Instructions (Addendum)
 Use two sprays of flonase  in each nostril twice per day  right after, use astelin spray two sprays each nostril twice per day  Pop your ears multiple times per day

## 2023-10-05 ENCOUNTER — Other Ambulatory Visit (HOSPITAL_COMMUNITY): Payer: Self-pay

## 2023-10-05 DIAGNOSIS — N401 Enlarged prostate with lower urinary tract symptoms: Secondary | ICD-10-CM | POA: Diagnosis not present

## 2023-10-05 DIAGNOSIS — N200 Calculus of kidney: Secondary | ICD-10-CM | POA: Diagnosis not present

## 2023-10-05 DIAGNOSIS — R3914 Feeling of incomplete bladder emptying: Secondary | ICD-10-CM | POA: Diagnosis not present

## 2023-10-05 MED ORDER — SILODOSIN 8 MG PO CAPS
8.0000 mg | ORAL_CAPSULE | Freq: Every day | ORAL | 3 refills | Status: AC
  Filled 2023-10-05 – 2023-11-15 (×2): qty 90, 90d supply, fill #0
  Filled 2024-02-18: qty 90, 90d supply, fill #1
  Filled 2024-05-16: qty 90, 90d supply, fill #0

## 2023-10-09 ENCOUNTER — Ambulatory Visit
Admission: RE | Admit: 2023-10-09 | Discharge: 2023-10-09 | Disposition: A | Discharge status: Home / Self Care | Source: Ambulatory Visit | Attending: Pulmonary Disease | Admitting: Pulmonary Disease

## 2023-10-09 DIAGNOSIS — R918 Other nonspecific abnormal finding of lung field: Secondary | ICD-10-CM | POA: Diagnosis not present

## 2023-10-09 DIAGNOSIS — J8410 Pulmonary fibrosis, unspecified: Secondary | ICD-10-CM | POA: Diagnosis not present

## 2023-10-09 DIAGNOSIS — R911 Solitary pulmonary nodule: Secondary | ICD-10-CM

## 2023-10-15 ENCOUNTER — Other Ambulatory Visit: Payer: Self-pay | Admitting: Pulmonary Disease

## 2023-10-16 ENCOUNTER — Other Ambulatory Visit: Payer: Self-pay

## 2023-10-16 ENCOUNTER — Other Ambulatory Visit (HOSPITAL_COMMUNITY): Payer: Self-pay

## 2023-10-16 MED ORDER — PANTOPRAZOLE SODIUM 40 MG PO TBEC
40.0000 mg | DELAYED_RELEASE_TABLET | Freq: Every day | ORAL | 2 refills | Status: DC
  Filled 2023-10-16: qty 30, 30d supply, fill #0
  Filled 2023-11-15: qty 30, 30d supply, fill #1
  Filled 2024-01-12: qty 30, 30d supply, fill #2

## 2023-10-23 NOTE — Progress Notes (Unsigned)
 NEUROLOGY FOLLOW UP OFFICE NOTE  Zachary Jacobs [id number]  Assessment/Plan:   Transient ischemic attack involving the left PCA territory  Advised to start ASA 81mg  daily for secondary stroke prevention Continue workup: Check 2D echo with Bubble study Check 2 week cardiac event monitor Reheck lipid panel Follow up ***   Subjective:  Zachary Jacobs is a 68 year old right-handed male with OSA and prediaberes who follows up for TIA.    UPDATE: Current medications:  ASA 81mg  daily  Continued stroke workup: LDL from 04/27/2023 was 78.  Advised to start rosuvastatin  5mg  daily.  ***.  2D echo with bubble study and 2 week cardiac event monitor was ordered which were not performed.  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  Visual deficit (graying) noted by patient when closing either eye (lower nasal OS, upper and lower temporal OD).  Lasted about 15 minutes.  No headache or eye pain.  Blood pressure was in the 150s systolic.  He was seen in the ED where MRI of brain without contrast showed mild chronic small vessel ischemic changes but no acute findings.  CTA head and neck revealed no LVO or hemodynamically significant stenosis.  He was seen by his eye doctor a month prior for vitreous floaters.  May have a mild headache from time to time but no history of migraines or other significant headaches.     Hgb A1c from September 2024 was 6.1.  PAST MEDICAL HISTORY: Past Medical History:  Diagnosis Date   Allergy    Anemia    as child   COPD, mild (HCC)    very mild   History of kidney stones    OSA (obstructive sleep apnea)    s/p  surgery 1996   Pneumonia    Pre-diabetes    Renal calculus, left    Renal cyst, right    SIMPLE   Sleep apnea     cpap   dont wear latley    MEDICATIONS: Current Outpatient Medications on File Prior to Visit  Medication Sig Dispense  Refill   aspirin  EC 81 MG tablet Take 81 mg by mouth daily. Swallow whole.     azelastine  (ASTELIN ) 0.1 % nasal spray Place 2 sprays into both nostrils 2 (two) times daily. 30 mL 12   finasteride  (PROSCAR ) 5 MG tablet Take 1 tablet (5 mg total) by mouth daily. 90 tablet 3   fluticasone  (FLONASE ) 50 MCG/ACT nasal spray Place into the nose.     fluticasone  furoate-vilanterol (BREO ELLIPTA ) 200-25 MCG/ACT AEPB Inhale 1 puff into the lungs daily. 60 each 6   ibuprofen  (ADVIL ) 600 MG tablet Take 1 tablet (600 mg total) by mouth every 6 (six) hours as needed. 30 tablet 0   levalbuterol  (XOPENEX  HFA) 45 MCG/ACT inhaler Inhale 1 puff into the lungs every 6 (six) hours as needed for wheezing. 15 g 11   losartan  (COZAAR ) 25 MG tablet Take 1 tablet (25 mg total) by mouth daily. (Patient not taking: Reported on 09/08/2023) 90 tablet 3   methylPREDNISolone  (MEDROL  DOSEPAK) 4 MG TBPK tablet Take as directed (Patient not taking: Reported on 09/08/2023) 1 each 0   pantoprazole  (PROTONIX ) 40 MG tablet Take 1 tablet (40 mg total) by mouth daily. 30 tablet 2   rosuvastatin  (CRESTOR ) 5 MG tablet Take 1 tablet (5 mg total) by mouth daily. 30 tablet 5   silodosin  (RAPAFLO )  8 MG CAPS capsule Take 1 capsule (8 mg total) by mouth daily. 90 capsule 3   Spacer/Aero-Holding Chambers DEVI 1 Device by Does not apply route once as needed for up to 1 dose (to use with rescue inhaler). 1 Canister 1   No current facility-administered medications on file prior to visit.    ALLERGIES: No Known Allergies  FAMILY HISTORY: Family History  Problem Relation Age of Onset   Cancer Mother    Heart disease Father    Colon polyps Father    Cancer Other    Obesity Other    Colon cancer Neg Hx    Esophageal cancer Neg Hx    Rectal cancer Neg Hx    Stomach cancer Neg Hx       Objective:  *** General: No acute distress.  Patient appears ***-groomed.   Head:  Normocephalic/atraumatic Eyes:  Fundi examined but not  visualized Neck: supple, no paraspinal tenderness, full range of motion Heart:  Regular rate and rhythm Lungs:  Clear to auscultation bilaterally Back: No paraspinal tenderness Neurological Exam: alert and oriented.  Speech fluent and not dysarthric, language intact.  CN II-XII intact. Bulk and tone normal, muscle strength 5/5 throughout.  Sensation to light touch intact.  Deep tendon reflexes 2+ throughout, toes downgoing.  Finger to nose testing intact.  Gait normal, Romberg negative.   Juliene Dunnings, DO  CC: ***

## 2023-10-26 ENCOUNTER — Ambulatory Visit: Payer: Commercial Managed Care - PPO | Admitting: Neurology

## 2023-10-26 ENCOUNTER — Encounter: Payer: Self-pay | Admitting: Neurology

## 2023-10-26 VITALS — BP 115/72 | HR 66 | Ht 70.0 in | Wt 251.0 lb

## 2023-10-26 DIAGNOSIS — G459 Transient cerebral ischemic attack, unspecified: Secondary | ICD-10-CM | POA: Diagnosis not present

## 2023-10-26 NOTE — Patient Instructions (Signed)
 CONTINUE ASA 81MG  DAILY AND ROSUVASTATIN  5MG  DAILY CHECK FASTING LIPID PANEL DOWNSTAIRS CHECK 2D ECHOCARDIOGRAM WITH BUBBLE STUDY CHECK 2 WEEK CARDIAC EVENT MONITOR

## 2023-10-27 ENCOUNTER — Other Ambulatory Visit

## 2023-10-27 DIAGNOSIS — G459 Transient cerebral ischemic attack, unspecified: Secondary | ICD-10-CM | POA: Diagnosis not present

## 2023-10-27 LAB — LIPID PANEL
Cholesterol: 117 mg/dL (ref <200)
HDL: 45 mg/dL (ref >=40)
LDL Cholesterol (Calc): 54 mg/dL
Non-HDL Cholesterol (Calc): 72 mg/dL (ref <130)
Total CHOL/HDL Ratio: 2.6 (calc) (ref <5.0)
Triglycerides: 95 mg/dL (ref <150)

## 2023-10-28 ENCOUNTER — Ambulatory Visit: Payer: Self-pay | Admitting: Neurology

## 2023-11-05 NOTE — Progress Notes (Signed)
 Called patients insurance and [phone]. CPT codes: 06753 and CPT codes: 06751. NO PA required. Reference Number# [id number].

## 2023-11-05 NOTE — Progress Notes (Unsigned)
 Called patients [employer] [phone]. Informed that CPT 93306 requires no PA. Reference#:[id number].

## 2023-11-15 ENCOUNTER — Other Ambulatory Visit: Payer: Self-pay | Admitting: Neurology

## 2023-11-16 ENCOUNTER — Ambulatory Visit (HOSPITAL_COMMUNITY)
Admission: RE | Admit: 2023-11-16 | Discharge: 2023-11-16 | Disposition: A | Discharge status: Home / Self Care | Source: Ambulatory Visit | Attending: Neurology | Admitting: Neurology

## 2023-11-16 ENCOUNTER — Other Ambulatory Visit: Payer: Self-pay

## 2023-11-16 ENCOUNTER — Other Ambulatory Visit (HOSPITAL_COMMUNITY): Payer: Self-pay

## 2023-11-16 DIAGNOSIS — G459 Transient cerebral ischemic attack, unspecified: Secondary | ICD-10-CM | POA: Diagnosis not present

## 2023-11-16 DIAGNOSIS — I517 Cardiomegaly: Secondary | ICD-10-CM | POA: Insufficient documentation

## 2023-11-16 LAB — ECHOCARDIOGRAM COMPLETE BUBBLE STUDY
Area-P 1/2: 3.5 cm2
Calc EF: 56.5 %
S' Lateral: 3.9 cm
Single Plane A2C EF: 45.5 %
Single Plane A4C EF: 61.1 %

## 2023-11-16 MED ORDER — PERFLUTREN LIPID MICROSPHERE
1.0000 mL | INTRAVENOUS | Status: AC | PRN
  Administered 2023-11-16: 1 mL via INTRAVENOUS

## 2023-11-17 ENCOUNTER — Other Ambulatory Visit (HOSPITAL_COMMUNITY): Payer: Self-pay

## 2023-11-17 MED ORDER — ROSUVASTATIN CALCIUM 5 MG PO TABS
5.0000 mg | ORAL_TABLET | Freq: Every day | ORAL | 5 refills | Status: DC
  Filled 2023-11-17: qty 30, 30d supply, fill #0
  Filled 2024-01-12: qty 30, 30d supply, fill #1
  Filled 2024-02-18: qty 30, 30d supply, fill #2
  Filled 2024-03-29: qty 30, 30d supply, fill #3
  Filled 2024-05-13: qty 30, 30d supply, fill #4
  Filled 2024-05-16: qty 30, 30d supply, fill #0

## 2023-11-20 NOTE — Progress Notes (Signed)
Tried calling patient- no answer/VM not set up.

## 2023-11-23 NOTE — Telephone Encounter (Signed)
 Pt called back in and left message on after hours service. He is returning our call

## 2023-11-23 NOTE — Progress Notes (Signed)
 Patient advised.

## 2023-12-08 NOTE — Progress Notes (Signed)
  [address], [address] [place], [place] [address] [phone]  Audiological Evaluation    Name: Zachary Jacobs     DOB:   21-Jun-1955      MRN:   [id number]                                                                                     Service Date: 12/08/2023     Accompanied by: unaccompanied   Patient comes today after Dr. Tobie, ENT sent a referral for a hearing evaluation due to concerns with ear pain.   Symptoms Yes Details  Hearing loss  []    Tinnitus  []    Ear pain/ infections/pressure  [x]  Right ear pressure- reports to be better today  Balance problems  []    Noise exposure history  []    Previous ear surgeries  []    Family history of hearing loss  []    Amplification  []    Other  []      Otoscopy: Right ear: Clear external ear canal and notable landmarks visualized on the tympanic membrane. Left ear:  Clear external ear canal and notable landmarks visualized on the tympanic membrane.  Tympanometry: Right ear: Type A- Normal external ear canal volume with normal middle ear pressure and tympanic membrane compliance. Left ear: Type A- Normal external ear canal volume with normal middle ear pressure and tympanic membrane compliance.    Pure tone Audiometry: Right ear- Normal to mild  sensorineural hearing loss from 125 Hz - 8000 Hz. Left ear-  Normal to mild sensorineural hearing loss from 125 Hz - 8000 Hz.  Speech Audiometry: Right ear- Speech Reception Threshold (SRT) was obtained at 25 dBHL. Left ear-Speech Reception Threshold (SRT) was obtained at 20 dBHL.   Word Recognition Score Tested using NU-6 (recorded) Right ear: 100% was obtained at a presentation level of 55 dBHL with contralateral masking which is deemed as  excellent. Left ear: 100% was obtained at a presentation level of 55 dBHL with contralateral masking which is deemed as  excellent.   The hearing test results were completed under headphones and results are deemed to be of good  reliability. Test technique:  conventional      Recommendations: Follow up with ENT as scheduled for today. Return for a hearing evaluation in 2 years, before if concerns with hearing changes arise or per MD recommendation.   Shemika Robbs MARIE LEROUX-MARTINEZ, AUD

## 2023-12-09 ENCOUNTER — Encounter (INDEPENDENT_AMBULATORY_CARE_PROVIDER_SITE_OTHER): Payer: Self-pay | Admitting: Otolaryngology

## 2023-12-09 ENCOUNTER — Ambulatory Visit (INDEPENDENT_AMBULATORY_CARE_PROVIDER_SITE_OTHER): Admitting: Audiology

## 2023-12-09 ENCOUNTER — Ambulatory Visit (INDEPENDENT_AMBULATORY_CARE_PROVIDER_SITE_OTHER): Admitting: Otolaryngology

## 2023-12-09 VITALS — BP 104/69 | HR 57 | Ht 70.0 in | Wt 255.0 lb

## 2023-12-09 DIAGNOSIS — H903 Sensorineural hearing loss, bilateral: Secondary | ICD-10-CM

## 2023-12-09 DIAGNOSIS — H6991 Unspecified Eustachian tube disorder, right ear: Secondary | ICD-10-CM

## 2023-12-09 DIAGNOSIS — H938X1 Other specified disorders of right ear: Secondary | ICD-10-CM

## 2023-12-09 NOTE — Progress Notes (Signed)
 Dear Dr. Tanda, Here is my assessment for our mutual patient, Zachary Jacobs. Thank you for allowing me the opportunity to care for your patient. Please do not hesitate to contact me should you have any other questions. Sincerely, Dr. Eldora Blanch  Otolaryngology Clinic Note Referring provider: Dr. Tanda HPI:  Zachary Jacobs is a 68 y.o. male kindly referred by Dr. Tanda for evaluation of right ear pain.  Initial visit (08/2023): Patient reports: he reports that his symptoms started about 3 months ago when he had pressure in right ear. Did not feel sick, but reports he had some runny nose. Given steroids, and it helped. Saw PCP in march who referred him. Current symptoms include ear pressure and post-auricular pressure few times a week (lasts most of the day), feels like when he is in a airplane. Intermittent bilateral tinnitus. Hearing is not really muffled. Intermittent non-pulsatile tinnitus. No popping/crackling. Overall, ear has slowly improved. Can pop his ears. Patient denies: ear pain, vertigo, drainage Patient also denies barotrauma, vestibular suppressant use, ototoxic medication use Prior ear surgery: no Normally no issues with ears. No frequent ear infections.  No sinonasal symptoms except Some AR symptoms - takes flonase  and PO antihistamine. Seems to help.  GERD on protonix .   --------------------------------------------------------- 12/09/2023 Seen in follow up. Did have audiogram today. He reports that his symptoms are continuing to improve. Has very mild intermittent ear fullness on right. When he pops his ears, it goes away. Using flonase  and astelin .   H&N Surgery: UPPP and tonsillectomy Personal or FHx of bleeding dz or anesthesia difficulty: no   GLP-1: no AP/AC: ASA 81  Tobacco: former, quit 80s  PMHx: COPD, OSA, Angina, Bronchitis, Asthma, TIA  Independent Review of Additional Tests or Records:  05/13/2023 Lauraine Kitty visit - positive for flu with URI sx;  Dx: Flu A, Rx: Tamiflu , supportive care 06/26/2023: Dr. Darral ([facility]): right ear fullness, no pain, no URI sx; Dx: ETD; Rx: Medrol  park Dr, Tanda 07/02/2023: continued right ear pressure, improved but recurred now; ref to ENT CBC and CMP 04/08/2023: WBC 10.5, Eos 100; BuN/Cr 15/0.99 CT Angio 04/08/2023 independently interpreted with respect to ears: cuts thick so suboptimal but mastoids and ME well aerated; no noted otic capsule or OC chain pathology 11/2023 Audiogram was independently reviewed and interpreted by me and it reveals - normal downsloping to mild b/l symmetric SNHL; WRT 100% AU at 55dB; A/A tymps  SNHL= Sensorineural hearing loss  PMH/Meds/All/SocHx/FamHx/ROS:   Past Medical History:  Diagnosis Date   Allergy    Anemia    as child   COPD, mild (HCC)    very mild   History of kidney stones    OSA (obstructive sleep apnea)    s/p  surgery 1996   Pneumonia    Pre-diabetes    Renal calculus, left    Renal cyst, right    SIMPLE   Sleep apnea     cpap   dont wear latley     Past Surgical History:  Procedure Laterality Date   CYSTO/  RIGHT URETEROSCOPY/  STENT PLACEMENT  10/ 2001   CYSTOSCOPY/URETEROSCOPY/HOLMIUM LASER/STENT PLACEMENT Left 03/30/2015   Procedure: LEFT URETEROSCOPY/HOLMIUM LASER LITHOTRIPSY/STENT PLACEMENT, WITH  STONE BASKET EXTRACTION;  Surgeon: Mark Ottelin, MD;  Location: [facility];  Service: Urology;  Laterality: Left;   HOLMIUM LASER APPLICATION Left 03/30/2015   Procedure: HOLMIUM LASER APPLICATION;  Surgeon: Mark Ottelin, MD;  Location: [facility];  Service: Urology;  Laterality: Left;  IR URETERAL STENT LEFT NEW ACCESS W/O SEP NEPHROSTOMY CATH  05/17/2019   LEFT HEART CATH AND CORONARY ANGIOGRAPHY N/A 06/15/2020   Procedure: LEFT HEART CATH AND CORONARY ANGIOGRAPHY;  Surgeon: Claudene Pacific, MD;  Location: [facility];  Service: Cardiovascular;  Laterality: N/A;   NEPHROLITHOTOMY Left 03/20/2015   Procedure:  NEPHROLITHOTOMY PERCUTANEOUS;  Surgeon: Mark Ottelin, MD;  Location: [facility];  Service: Urology;  Laterality: Left;   NEPHROLITHOTOMY Left 05/17/2019   Procedure: NEPHROLITHOTOMY PERCUTANEOUS;  Surgeon: Ottelin, Mark, MD;  Location: [facility];  Service: Urology;  Laterality: Left;   TONSILLECTOMY     as child   UVULOPALATOPHARYNGOPLASTY  1996   nasal surgery    Family History  Problem Relation Age of Onset   Cancer Mother    Heart disease Father    Colon polyps Father    Cancer Other    Obesity Other    Colon cancer Neg Hx    Esophageal cancer Neg Hx    Rectal cancer Neg Hx    Stomach cancer Neg Hx      Social Connections: Socially Integrated (06/30/2023)   Social Connection and Isolation Panel    Frequency of Communication with Friends and Family: Twice a week    Frequency of Social Gatherings with Friends and Family: Once a week    Attends Religious Services: 1 to 4 times per year    Active Member of [employer] or Organizations: Yes    Attends Banker Meetings: 1 to 4 times per year    Marital Status: Married      Current Outpatient Medications:    aspirin  EC 81 MG tablet, Take 81 mg by mouth daily. Swallow whole., Disp: , Rfl:    azelastine  (ASTELIN ) 0.1 % nasal spray, Place 2 sprays into both nostrils 2 (two) times daily., Disp: 30 mL, Rfl: 12   finasteride  (PROSCAR ) 5 MG tablet, Take 1 tablet (5 mg total) by mouth daily., Disp: 90 tablet, Rfl: 3   fluticasone  (FLONASE ) 50 MCG/ACT nasal spray, Place into the nose., Disp: , Rfl:    fluticasone  furoate-vilanterol (BREO ELLIPTA ) 200-25 MCG/ACT AEPB, Inhale 1 puff into the lungs daily., Disp: 60 each, Rfl: 6   ibuprofen  (ADVIL ) 600 MG tablet, Take 1 tablet (600 mg total) by mouth every 6 (six) hours as needed., Disp: 30 tablet, Rfl: 0   levalbuterol  (XOPENEX  HFA) 45 MCG/ACT inhaler, Inhale 1 puff into the lungs every 6 (six) hours as needed for wheezing., Disp: 15 g, Rfl: 11   pantoprazole  (PROTONIX ) 40 MG tablet, Take 1  tablet (40 mg total) by mouth daily., Disp: 30 tablet, Rfl: 2   rosuvastatin  (CRESTOR ) 5 MG tablet, Take 1 tablet (5 mg total) by mouth daily., Disp: 30 tablet, Rfl: 5   silodosin  (RAPAFLO ) 8 MG CAPS capsule, Take 1 capsule (8 mg total) by mouth daily., Disp: 90 capsule, Rfl: 3   Spacer/Aero-Holding Chambers DEVI, 1 Device by Does not apply route once as needed for up to 1 dose (to use with rescue inhaler)., Disp: 1 Canister, Rfl: 1   losartan  (COZAAR ) 25 MG tablet, Take 1 tablet (25 mg total) by mouth daily. (Patient not taking: Reported on 12/09/2023), Disp: 90 tablet, Rfl: 3   Physical Exam:   BP 104/69 (BP Location: Left Arm, Patient Position: Sitting, Cuff Size: Large)   Pulse (!) 57   Ht 5' 10 (1.778 m)   Wt 255 lb (115.7 kg)   SpO2 95%   BMI 36.59 kg/m  Salient findings:  CN II-XII intact Bilateral EAC clear and TM intact with very mild right pars flaccida retraction; no effusion noted today; Given history and complaints, ear microscopy was indicated and performed for evaluation with findings as below in physical exam section and in procedures Anterior rhinoscopy: Septum intact; bilateral inferior turbinates without significant hypertrophy No lesions of oral cavity/oropharynx; prior UPPP  Seprately Identifiable Procedures:  Prior to initiating any procedures, risks/benefits/alternatives were explained to the patient and verbal consent obtained. Procedure: Bilateral ear microscopy using microscope (CPT [phone]) Pre-procedure diagnosis: eustachian tube dysfunction, ear fullness Post-procedure diagnosis: same Indication: see above; given patient's otologic complaints and history, for improved and comprehensive examination of external ear and tympanic membrane, bilateral otologic examination using microscope was performed  Procedure: Patient was placed semi-recumbent. Both ear canals were examined using the microscope with findings above.  Patient tolerated the procedure  well.   Impression & Plans:  Koltin Wehmeyer is a 68 y.o. male with:  1. ETD (Eustachian tube dysfunction), right   2. Sensation of fullness in right ear   3. Sensorineural hearing loss (SNHL) of both ears    Right ear fullness and pressure after Flu; now much improved after flonsae and astelin  Audio reassuring with A/A tymps. We discussed options and given improvement, would recommend continued medical management with flonase  and astelin  BID; if symptoms resolve, can discontinue He is in agreement; F/u if symptoms do not continue to improve or worsen  See below regarding exact medications prescribed this encounter including dosages and route: No orders of the defined types were placed in this encounter.   Thank you for allowing me the opportunity to care for your patient. Please do not hesitate to contact me should you have any other questions.  Sincerely, Eldora Blanch, MD Otolaryngologist (ENT), [facility] Phone: [phone] Fax: [phone]  12/09/2023, 9:58 AM   MDM:  [phone] Complexity/Problems addressed: low - chronic issue Data complexity: low - Morbidity: low - Drug prescribed or managed: n

## 2023-12-14 ENCOUNTER — Encounter: Payer: Self-pay | Admitting: Audiology

## 2024-01-04 ENCOUNTER — Encounter: Payer: Self-pay | Admitting: Physician Assistant

## 2024-01-04 ENCOUNTER — Telehealth: Admitting: Physician Assistant

## 2024-01-04 ENCOUNTER — Other Ambulatory Visit (HOSPITAL_COMMUNITY): Payer: Self-pay

## 2024-01-04 DIAGNOSIS — U071 COVID-19: Secondary | ICD-10-CM

## 2024-01-04 MED ORDER — NIRMATRELVIR/RITONAVIR (PAXLOVID)TABLET: 3.0000 | ORAL_TABLET | Freq: Two times a day (BID) | ORAL | 0 refills | Status: DC

## 2024-01-04 MED ORDER — MOLNUPIRAVIR EUA 200MG CAPSULE
4.0000 | ORAL_CAPSULE | Freq: Two times a day (BID) | ORAL | 0 refills | Status: AC
  Filled 2024-01-04: qty 40, 5d supply, fill #0

## 2024-01-04 MED ORDER — NIRMATRELVIR/RITONAVIR (PAXLOVID)TABLET
3.0000 | ORAL_TABLET | Freq: Two times a day (BID) | ORAL | 0 refills | Status: AC
  Filled 2024-01-04: qty 30, 5d supply, fill #0

## 2024-01-04 MED ORDER — MOLNUPIRAVIR EUA 200MG CAPSULE: 4.0000 | ORAL_CAPSULE | Freq: Two times a day (BID) | ORAL | 0 refills | Status: DC

## 2024-01-04 NOTE — Progress Notes (Signed)
 Virtual Visit Consent   Zachary Jacobs, you are scheduled for a virtual visit with a [facility] provider today. Just as with appointments in the office, your consent must be obtained to participate. Your consent will be active for this visit and any virtual visit you may have with one of our providers in the next 365 days. If you have a MyChart account, a copy of this consent can be sent to you electronically.  As this is a virtual visit, video technology does not allow for your provider to perform a traditional examination. This may limit your provider's ability to fully assess your condition. If your provider identifies any concerns that need to be evaluated in person or the need to arrange testing (such as labs, EKG, etc.), we will make arrangements to do so. Although advances in technology are sophisticated, we cannot ensure that it will always work on either your end or our end. If the connection with a video visit is poor, the visit may have to be switched to a telephone visit. With either a video or telephone visit, we are not always able to ensure that we have a secure connection.  By engaging in this virtual visit, you consent to the provision of healthcare and authorize for your insurance to be billed (if applicable) for the services provided during this visit. Depending on your insurance coverage, you may receive a charge related to this service.  I need to obtain your verbal consent now. Are you willing to proceed with your visit today? Zachary Jacobs has provided verbal consent on 01/04/2024 for a virtual visit (video or telephone). Delon CHRISTELLA Dickinson, PA-C  Date: 01/04/2024 3:25 PM   Virtual Visit via Video Note   I, Delon CHRISTELLA Dickinson, connected with  Zachary Jacobs  ([id number], 12/05/1955) on 01/04/24 at  3:15 PM EDT by a video-enabled telemedicine application and verified that I am speaking with the correct person using two identifiers.  Location: Patient: Virtual Visit  Location Patient: Home Provider: Virtual Visit Location Provider: Home Office   I discussed the limitations of evaluation and management by telemedicine and the availability of in person appointments. The patient expressed understanding and agreed to proceed.    History of Present Illness: Zachary Jacobs is a 68 y.o. who identifies as a male who was assigned male at birth, and is being seen today for Covid 69.  HPI: URI  This is a new problem. The current episode started yesterday (Tested positive for Covid 19 on at home test today; symptoms started yesterday). The problem has been gradually worsening. The maximum temperature recorded prior to his arrival was 100.4 - 100.9 F (100.5). The fever has been present for Less than 1 day. Associated symptoms include congestion, coughing (mild), headaches (mild), a plugged ear sensation (right-has chronic ETD), rhinorrhea and a sore throat (scratchy). Pertinent negatives include no chest pain, diarrhea, ear pain, nausea, sinus pain, vomiting or wheezing. Associated symptoms comments: Body aches. He has tried nothing for the symptoms. The treatment provided no relief.     Problems:  Patient Active Problem List   Diagnosis Date Noted   Asthma 05/10/2022   Prediabetes 07/12/2020   Unstable angina (HCC) 06/15/2020   Abnormal electrocardiogram during exercise stress test 06/15/2020   Incomplete left bundle branch block 05/31/2020   Lower urinary tract symptoms (LUTS) 03/13/2017   History of kidney stones 03/13/2017   Staghorn renal calculus 03/20/2015   Hydronephrosis     Allergies: No Known Allergies Medications:  Current Outpatient Medications:    nirmatrelvir /ritonavir  (PAXLOVID ) 20 x 150 MG & 10 x 100MG  TABS, Take 3 tablets by mouth 2 (two) times daily for 5 days. (Take nirmatrelvir  150 mg two tablets twice daily for 5 days and ritonavir  100 mg one tablet twice daily for 5 days) Patient GFR is greater than 60, Disp: 30 tablet, Rfl: 0   aspirin   EC 81 MG tablet, Take 81 mg by mouth daily. Swallow whole., Disp: , Rfl:    azelastine  (ASTELIN ) 0.1 % nasal spray, Place 2 sprays into both nostrils 2 (two) times daily., Disp: 30 mL, Rfl: 12   finasteride  (PROSCAR ) 5 MG tablet, Take 1 tablet (5 mg total) by mouth daily., Disp: 90 tablet, Rfl: 3   fluticasone  (FLONASE ) 50 MCG/ACT nasal spray, Place into the nose., Disp: , Rfl:    fluticasone  furoate-vilanterol (BREO ELLIPTA ) 200-25 MCG/ACT AEPB, Inhale 1 puff into the lungs daily., Disp: 60 each, Rfl: 6   ibuprofen  (ADVIL ) 600 MG tablet, Take 1 tablet (600 mg total) by mouth every 6 (six) hours as needed., Disp: 30 tablet, Rfl: 0   levalbuterol  (XOPENEX  HFA) 45 MCG/ACT inhaler, Inhale 1 puff into the lungs every 6 (six) hours as needed for wheezing., Disp: 15 g, Rfl: 11   losartan  (COZAAR ) 25 MG tablet, Take 1 tablet (25 mg total) by mouth daily. (Patient not taking: Reported on 12/09/2023), Disp: 90 tablet, Rfl: 3   pantoprazole  (PROTONIX ) 40 MG tablet, Take 1 tablet (40 mg total) by mouth daily., Disp: 30 tablet, Rfl: 2   rosuvastatin  (CRESTOR ) 5 MG tablet, Take 1 tablet (5 mg total) by mouth daily., Disp: 30 tablet, Rfl: 5   silodosin  (RAPAFLO ) 8 MG CAPS capsule, Take 1 capsule (8 mg total) by mouth daily., Disp: 90 capsule, Rfl: 3   Spacer/Aero-Holding Chambers DEVI, 1 Device by Does not apply route once as needed for up to 1 dose (to use with rescue inhaler)., Disp: 1 Canister, Rfl: 1  Observations/Objective: Patient is well-developed, well-nourished in no acute distress.  Resting comfortably at home.  Head is normocephalic, atraumatic.  No labored breathing.  Speech is clear and coherent with logical content.  Patient is alert and oriented at baseline.    Assessment and Plan: 1. COVID-19 (Primary) - nirmatrelvir /ritonavir  (PAXLOVID ) 20 x 150 MG & 10 x 100MG  TABS; Take 3 tablets by mouth 2 (two) times daily for 5 days. (Take nirmatrelvir  150 mg two tablets twice daily for 5 days and  ritonavir  100 mg one tablet twice daily for 5 days) Patient GFR is greater than 60  Dispense: 30 tablet; Refill: 0  - Continue OTC symptomatic management of choice - Will send OTC vitamins and supplement information through AVS - Paxlovid  prescribed; HOLD Crestor  (rosuvastatin ) - Patient enrolled in MyChart symptom monitoring - Push fluids - Rest as needed - Discussed return precautions and when to seek in-person evaluation, sent via AVS as well  Follow Up Instructions: I discussed the assessment and treatment plan with the patient. The patient was provided an opportunity to ask questions and all were answered. The patient agreed with the plan and demonstrated an understanding of the instructions.  A copy of instructions were sent to the patient via MyChart unless otherwise noted below.     The patient was advised to call back or seek an in-person evaluation if the symptoms worsen or if the condition fails to improve as anticipated.    Delon CHRISTELLA Dickinson, PA-C

## 2024-01-04 NOTE — Patient Instructions (Signed)
 Elspeth LITTIE Shams, thank you for joining Delon CHRISTELLA Dickinson, PA-C for today's virtual visit.  While this provider is not your primary care provider (PCP), if your PCP is located in our provider database this encounter information will be shared with them immediately following your visit.   A [facility] MyChart account gives you access to today's visit and all your visits, tests, and labs performed at [facility]  click here if you don't have a [facility] MyChart account or go to [email or web address]  Consent: (Patient) Elspeth LITTIE Shams provided verbal consent for this virtual visit at the beginning of the encounter.  Current Medications:  Current Outpatient Medications:    nirmatrelvir /ritonavir  (PAXLOVID ) 20 x 150 MG & 10 x 100MG  TABS, Take 3 tablets by mouth 2 (two) times daily for 5 days. (Take nirmatrelvir  150 mg two tablets twice daily for 5 days and ritonavir  100 mg one tablet twice daily for 5 days) Patient GFR is greater than 60, Disp: 30 tablet, Rfl: 0   aspirin  EC 81 MG tablet, Take 81 mg by mouth daily. Swallow whole., Disp: , Rfl:    azelastine  (ASTELIN ) 0.1 % nasal spray, Place 2 sprays into both nostrils 2 (two) times daily., Disp: 30 mL, Rfl: 12   finasteride  (PROSCAR ) 5 MG tablet, Take 1 tablet (5 mg total) by mouth daily., Disp: 90 tablet, Rfl: 3   fluticasone  (FLONASE ) 50 MCG/ACT nasal spray, Place into the nose., Disp: , Rfl:    fluticasone  furoate-vilanterol (BREO ELLIPTA ) 200-25 MCG/ACT AEPB, Inhale 1 puff into the lungs daily., Disp: 60 each, Rfl: 6   ibuprofen  (ADVIL ) 600 MG tablet, Take 1 tablet (600 mg total) by mouth every 6 (six) hours as needed., Disp: 30 tablet, Rfl: 0   levalbuterol  (XOPENEX  HFA) 45 MCG/ACT inhaler, Inhale 1 puff into the lungs every 6 (six) hours as needed for wheezing., Disp: 15 g, Rfl: 11   losartan  (COZAAR ) 25 MG tablet, Take 1 tablet (25 mg total) by mouth daily. (Patient not taking: Reported on 12/09/2023), Disp: 90 tablet,  Rfl: 3   pantoprazole  (PROTONIX ) 40 MG tablet, Take 1 tablet (40 mg total) by mouth daily., Disp: 30 tablet, Rfl: 2   rosuvastatin  (CRESTOR ) 5 MG tablet, Take 1 tablet (5 mg total) by mouth daily., Disp: 30 tablet, Rfl: 5   silodosin  (RAPAFLO ) 8 MG CAPS capsule, Take 1 capsule (8 mg total) by mouth daily., Disp: 90 capsule, Rfl: 3   Spacer/Aero-Holding Chambers DEVI, 1 Device by Does not apply route once as needed for up to 1 dose (to use with rescue inhaler)., Disp: 1 Canister, Rfl: 1   Medications ordered in this encounter:  Meds ordered this encounter  Medications   nirmatrelvir /ritonavir  (PAXLOVID ) 20 x 150 MG & 10 x 100MG  TABS    Sig: Take 3 tablets by mouth 2 (two) times daily for 5 days. (Take nirmatrelvir  150 mg two tablets twice daily for 5 days and ritonavir  100 mg one tablet twice daily for 5 days) Patient GFR is greater than 60    Dispense:  30 tablet    Refill:  0    Supervising Provider:   BLAISE ALEENE KIDD [phone]     *If you need refills on other medications prior to your next appointment, please contact your pharmacy*  Follow-Up: Call back or seek an in-person evaluation if the symptoms worsen or if the condition fails to improve as anticipated.  [facility] [phone]  Care Instructions: Can take to lessen severity (if  able): Vit C 500mg  twice daily Quercertin 250-500mg  twice daily Zinc  75-100mg  daily Melatonin 3-6 mg at bedtime Vit D3 1000-2000 IU daily Aspirin  81 mg daily with food Optional: Famotidine 20mg  daily Also can add tylenol /ibuprofen  as needed for fevers and body aches May add Mucinex  or Mucinex  DM as needed for cough/congestion   Isolation Instructions: You are to isolate at home until you have been fever free for at least 24 hours without a fever-reducing medication, and symptoms have been steadily improving for 24 hours. At that time,  you can end isolation but need to mask for an additional 5 days.   If you must be around  other household members who do not have symptoms, you need to make sure that both you and the family members are masking consistently with a high-quality mask.  If you note any worsening of symptoms despite treatment, please seek an in-person evaluation ASAP. If you note any significant shortness of breath or any chest pain, please seek ER evaluation. Please do not delay care!   COVID-19: What to Do if You Are Sick If you test positive and are an older adult or someone who is at high risk of getting very sick from COVID-19, treatment may be available. Contact a healthcare provider right away after a positive test to determine if you are eligible, even if your symptoms are mild right now. You can also visit a Test to Treat location and, if eligible, receive a prescription from a provider. Don't delay: Treatment must be started within the first few days to be effective. If you have a fever, cough, or other symptoms, you might have COVID-19. Most people have mild illness and are able to recover at home. If you are sick: Keep track of your symptoms. If you have an emergency warning sign (including trouble breathing), call 911. Steps to help prevent the spread of COVID-19 if you are sick If you are sick with COVID-19 or think you might have COVID-19, follow the steps below to care for yourself and to help protect other people in your home and community. Stay home except to get medical care Stay home. Most people with COVID-19 have mild illness and can recover at home without medical care. Do not leave your home, except to get medical care. Do not visit public areas and do not go to places where you are unable to wear a mask. Take care of yourself. Get rest and stay hydrated. Take over-the-counter medicines, such as acetaminophen , to help you feel better. Stay in touch with your doctor. Call before you get medical care. Be sure to get care if you have trouble breathing, or have any other emergency warning  signs, or if you think it is an emergency. Avoid public transportation, ride-sharing, or taxis if possible. Get tested If you have symptoms of COVID-19, get tested. While waiting for test results, stay away from others, including staying apart from those living in your household. Get tested as soon as possible after your symptoms start. Treatments may be available for people with COVID-19 who are at risk for becoming very sick. Don't delay: Treatment must be started early to be effective--some treatments must begin within 5 days of your first symptoms. Contact your healthcare provider right away if your test result is positive to determine if you are eligible. Self-tests are one of several options for testing for the virus that causes COVID-19 and may be more convenient than laboratory-based tests and point-of-care tests. Ask your healthcare provider or your  local health department if you need help interpreting your test results. You can visit your state, tribal, local, and territorial health department's website to look for the latest local information on testing sites. Separate yourself from other people As much as possible, stay in a specific room and away from other people and pets in your home. If possible, you should use a separate bathroom. If you need to be around other people or animals in or outside of the home, wear a well-fitting mask. Tell your close contacts that they may have been exposed to COVID-19. An infected person can spread COVID-19 starting 48 hours (or 2 days) before the person has any symptoms or tests positive. By letting your close contacts know they may have been exposed to COVID-19, you are helping to protect everyone. See COVID-19 and Animals if you have questions about pets. If you are diagnosed with COVID-19, someone from the health department may call you. Answer the call to slow the spread. Monitor your symptoms Symptoms of COVID-19 include fever, cough, or other  symptoms. Follow care instructions from your healthcare provider and local health department. Your local health authorities may give instructions on checking your symptoms and reporting information. When to seek emergency medical attention Look for emergency warning signs* for COVID-19. If someone is showing any of these signs, seek emergency medical care immediately: Trouble breathing Persistent pain or pressure in the chest New confusion Inability to wake or stay awake Pale, gray, or blue-colored skin, lips, or nail beds, depending on skin tone *This list is not all possible symptoms. Please call your medical provider for any other symptoms that are severe or concerning to you. Call 911 or call ahead to your local emergency facility: Notify the operator that you are seeking care for someone who has or may have COVID-19. Call ahead before visiting your doctor Call ahead. Many medical visits for routine care are being postponed or done by phone or telemedicine. If you have a medical appointment that cannot be postponed, call your doctor's office, and tell them you have or may have COVID-19. This will help the office protect themselves and other patients. If you are sick, wear a well-fitting mask You should wear a mask if you must be around other people or animals, including pets (even at home). Wear a mask with the best fit, protection, and comfort for you. You don't need to wear the mask if you are alone. If you can't put on a mask (because of trouble breathing, for example), cover your coughs and sneezes in some other way. Try to stay at least 6 feet away from other people. This will help protect the people around you. Masks should not be placed on young children under age 4 years, anyone who has trouble breathing, or anyone who is not able to remove the mask without help. Cover your coughs and sneezes Cover your mouth and nose with a tissue when you cough or sneeze. Throw away used tissues in  a lined trash can. Immediately wash your hands with soap and water for at least 20 seconds. If soap and water are not available, clean your hands with an alcohol-based hand sanitizer that contains at least 60% alcohol. Clean your hands often Wash your hands often with soap and water for at least 20 seconds. This is especially important after blowing your nose, coughing, or sneezing; going to the bathroom; and before eating or preparing food. Use hand sanitizer if soap and water are not available. Use an alcohol-based  hand sanitizer with at least 60% alcohol, covering all surfaces of your hands and rubbing them together until they feel dry. Soap and water are the best option, especially if hands are visibly dirty. Avoid touching your eyes, nose, and mouth with unwashed hands. Handwashing Tips Avoid sharing personal household items Do not share dishes, drinking glasses, cups, eating utensils, towels, or bedding with other people in your home. Wash these items thoroughly after using them with soap and water or put in the dishwasher. Clean surfaces in your home regularly Clean and disinfect high-touch surfaces (for example, doorknobs, tables, handles, light switches, and countertops) in your sick room and bathroom. In shared spaces, you should clean and disinfect surfaces and items after each use by the person who is ill. If you are sick and cannot clean, a caregiver or other person should only clean and disinfect the area around you (such as your bedroom and bathroom) on an as needed basis. Your caregiver/other person should wait as long as possible (at least several hours) and wear a mask before entering, cleaning, and disinfecting shared spaces that you use. Clean and disinfect areas that may have blood, stool, or body fluids on them. Use household cleaners and disinfectants. Clean visible dirty surfaces with household cleaners containing soap or detergent. Then, use a household disinfectant. Use a  product from [employer] List N: Disinfectants for Coronavirus (COVID-19). Be sure to follow the instructions on the label to ensure safe and effective use of the product. Many products recommend keeping the surface wet with a disinfectant for a certain period of time (look at contact time on the product label). You may also need to wear personal protective equipment, such as gloves, depending on the directions on the product label. Immediately after disinfecting, wash your hands with soap and water for 20 seconds. For completed guidance on cleaning and disinfecting your home, visit Complete Disinfection Guidance. Take steps to improve ventilation at home Improve ventilation (air flow) at home to help prevent from spreading COVID-19 to other people in your household. Clear out COVID-19 virus particles in the air by opening windows, using air filters, and turning on fans in your home. Use this interactive tool to learn how to improve air flow in your home. When you can be around others after being sick with COVID-19 Deciding when you can be around others is different for different situations. Find out when you can safely end home isolation. For any additional questions about your care, contact your healthcare provider or state or local health department. 07/17/2020 Content source: [facility] ([facility]), Division of Viral Diseases This information is not intended to replace advice given to you by your health care provider. Make sure you discuss any questions you have with your health care provider. Document Revised: 08/30/2020 Document Reviewed: 08/30/2020 Elsevier Patient Education  2022 [employer].     If you have been instructed to have an in-person evaluation today at a local Urgent Care facility, please use the link below. It will take you to a list of all of our available [facility], including address, phone number and hours of  operation. Please do not delay care.  [facility]  If you or a family member do not have a primary care provider, use the link below to schedule a visit and establish care. When you choose a [facility] primary care physician or advanced practice provider, you gain a long-term partner in health. Find a Primary  Care Provider  Learn more about [facility]'s in-office and virtual care options: [facility] - Get Care Now

## 2024-01-04 NOTE — Addendum Note (Signed)
 Addended by: VIVIENNE DELON HERO on: 01/04/2024 05:28 PM   Modules accepted: Orders

## 2024-01-04 NOTE — Addendum Note (Signed)
 Addended by: VIVIENNE DELON HERO on: 01/04/2024 05:27 PM   Modules accepted: Orders

## 2024-01-12 ENCOUNTER — Other Ambulatory Visit (HOSPITAL_COMMUNITY): Payer: Self-pay

## 2024-01-12 ENCOUNTER — Other Ambulatory Visit: Payer: Self-pay

## 2024-01-15 ENCOUNTER — Other Ambulatory Visit (HOSPITAL_COMMUNITY): Payer: Self-pay

## 2024-01-18 ENCOUNTER — Other Ambulatory Visit (HOSPITAL_COMMUNITY): Payer: Self-pay

## 2024-02-18 ENCOUNTER — Other Ambulatory Visit: Payer: Self-pay | Admitting: Pulmonary Disease

## 2024-02-19 ENCOUNTER — Other Ambulatory Visit (HOSPITAL_COMMUNITY): Payer: Self-pay

## 2024-02-19 ENCOUNTER — Other Ambulatory Visit: Payer: Self-pay

## 2024-02-19 MED ORDER — PANTOPRAZOLE SODIUM 40 MG PO TBEC
40.0000 mg | DELAYED_RELEASE_TABLET | Freq: Every day | ORAL | 2 refills | Status: AC
  Filled 2024-02-19: qty 30, 30d supply, fill #0
  Filled 2024-03-29: qty 30, 30d supply, fill #1
  Filled 2024-05-13: qty 30, 30d supply, fill #2

## 2024-02-29 ENCOUNTER — Other Ambulatory Visit (HOSPITAL_COMMUNITY): Payer: Self-pay

## 2024-03-09 DIAGNOSIS — R399 Unspecified symptoms and signs involving the genitourinary system: Secondary | ICD-10-CM | POA: Diagnosis not present

## 2024-03-09 DIAGNOSIS — R3914 Feeling of incomplete bladder emptying: Secondary | ICD-10-CM | POA: Diagnosis not present

## 2024-03-09 DIAGNOSIS — N3289 Other specified disorders of bladder: Secondary | ICD-10-CM | POA: Diagnosis not present

## 2024-03-09 DIAGNOSIS — N401 Enlarged prostate with lower urinary tract symptoms: Secondary | ICD-10-CM | POA: Diagnosis not present

## 2024-03-09 DIAGNOSIS — R82998 Other abnormal findings in urine: Secondary | ICD-10-CM | POA: Diagnosis not present

## 2024-03-23 ENCOUNTER — Ambulatory Visit
Admission: RE | Admit: 2024-03-23 | Discharge: 2024-03-23 | Disposition: A | Discharge status: Home / Self Care | Attending: Family Medicine | Admitting: Family Medicine

## 2024-03-23 ENCOUNTER — Other Ambulatory Visit (HOSPITAL_COMMUNITY): Payer: Self-pay

## 2024-03-23 VITALS — BP 120/79 | HR 51 | Temp 98.3°F | Resp 18 | Ht 70.0 in | Wt 250.0 lb

## 2024-03-23 DIAGNOSIS — R300 Dysuria: Secondary | ICD-10-CM

## 2024-03-23 DIAGNOSIS — R3914 Feeling of incomplete bladder emptying: Secondary | ICD-10-CM

## 2024-03-23 DIAGNOSIS — N401 Enlarged prostate with lower urinary tract symptoms: Secondary | ICD-10-CM

## 2024-03-23 LAB — POCT URINALYSIS DIP (MANUAL ENTRY)
Bilirubin, UA: NEGATIVE
Glucose, UA: NEGATIVE mg/dL
Ketones, POC UA: NEGATIVE mg/dL
Nitrite, UA: NEGATIVE
Protein Ur, POC: NEGATIVE mg/dL
Spec Grav, UA: 1.015 (ref 1.010–1.025)
Urobilinogen, UA: 0.2 U/dL
pH, UA: 8 (ref 5.0–8.0)

## 2024-03-23 MED ORDER — CIPROFLOXACIN HCL 500 MG PO TABS
500.0000 mg | ORAL_TABLET | Freq: Two times a day (BID) | ORAL | 0 refills | Status: DC
  Filled 2024-03-23: qty 14, 7d supply, fill #0

## 2024-03-23 NOTE — ED Triage Notes (Signed)
 Patient c/o cloudy urine x 2 days, dysuria, urinary urgency and frequency.  Afebrile.

## 2024-03-23 NOTE — Discharge Instructions (Signed)
 Drink lots of water Take the cipro  2 x a day Follow up with your urologist for problems   GOOD TO SEE YOU AGAIN

## 2024-03-23 NOTE — ED Provider Notes (Signed)
 [name]    CSN: [id number] Arrival date & time: 03/23/24  0801      History   Chief Complaint Chief Complaint  Patient presents with   Urinary Frequency    HPI Zachary Jacobs is a 68 y.o. male.   HPI  Patient has known kidney stones.  Also has known prostatic enlargement and is on Proscar .  He is under the care of urology.  He does have incomplete bladder emptying.  Has had a couple of bladder infections.  He is here because he thinks he has another bladder infection.  Has some cloudy urine, dysuria, and frequency.  No abdominal pain.  No fever or chills.  No nausea or vomiting.  Past Medical History:  Diagnosis Date   Allergy    Anemia    as child   COPD, mild (HCC)    very mild   History of kidney stones    OSA (obstructive sleep apnea)    s/p  surgery 1996   Pneumonia    Pre-diabetes    Renal calculus, left    Renal cyst, right    SIMPLE   Sleep apnea     cpap   dont wear latley    Patient Active Problem List   Diagnosis Date Noted   Asthma 05/10/2022   Prediabetes 07/12/2020   Unstable angina (HCC) 06/15/2020   Abnormal electrocardiogram during exercise stress test 06/15/2020   Incomplete left bundle branch block 05/31/2020   Lower urinary tract symptoms (LUTS) 03/13/2017   History of kidney stones 03/13/2017   Staghorn renal calculus 03/20/2015   Hydronephrosis     Past Surgical History:  Procedure Laterality Date   CYSTO/  RIGHT URETEROSCOPY/  STENT PLACEMENT  10/ 2001   CYSTOSCOPY/URETEROSCOPY/HOLMIUM LASER/STENT PLACEMENT Left 03/30/2015   Procedure: LEFT URETEROSCOPY/HOLMIUM LASER LITHOTRIPSY/STENT PLACEMENT, WITH  STONE BASKET EXTRACTION;  Surgeon: Mark Ottelin, MD;  Location: [facility] ;  Service: Urology;  Laterality: Left;   HOLMIUM LASER APPLICATION Left 03/30/2015   Procedure: HOLMIUM LASER APPLICATION;  Surgeon: Mark Ottelin, MD;  Location: [facility];  Service: Urology;  Laterality: Left;    IR URETERAL STENT LEFT NEW ACCESS W/O SEP NEPHROSTOMY CATH  05/17/2019   LEFT HEART CATH AND CORONARY ANGIOGRAPHY N/A 06/15/2020   Procedure: LEFT HEART CATH AND CORONARY ANGIOGRAPHY;  Surgeon: Claudene Pacific, MD;  Location: [facility];  Service: Cardiovascular;  Laterality: N/A;   NEPHROLITHOTOMY Left 03/20/2015   Procedure: NEPHROLITHOTOMY PERCUTANEOUS;  Surgeon: Mark Ottelin, MD;  Location: [facility];  Service: Urology;  Laterality: Left;   NEPHROLITHOTOMY Left 05/17/2019   Procedure: NEPHROLITHOTOMY PERCUTANEOUS;  Surgeon: Ottelin, Mark, MD;  Location: [facility];  Service: Urology;  Laterality: Left;   TONSILLECTOMY     as child   UVULOPALATOPHARYNGOPLASTY  1996   nasal surgery       Home Medications    Prior to Admission medications   Medication Sig Start Date End Date Taking? Authorizing Provider  aspirin  EC 81 MG tablet Take 81 mg by mouth daily. Swallow whole.   Yes [provider]  azelastine  (ASTELIN ) 0.1 % nasal spray Place 2 sprays into both nostrils 2 (two) times daily. 09/08/23 03/25/24 Yes Tobie Comp B, MD  ciprofloxacin  (CIPRO ) 500 MG tablet Take 1 tablet (500 mg total) by mouth 2 (two) times daily. 03/23/24  Yes Maranda Jamee [name], MD  finasteride  (PROSCAR ) 5 MG tablet Take 1 tablet (5 mg total) by mouth daily. 09/18/22  Yes  fluticasone  (FLONASE ) 50 MCG/ACT nasal spray Place into the nose. 10/12/20  Yes [provider]  fluticasone  furoate-vilanterol (BREO ELLIPTA ) 200-25 MCG/ACT AEPB Inhale 1 puff into the lungs daily. 05/05/23  Yes Hunsucker, Donnice SAUNDERS, MD  ibuprofen  (ADVIL ) 600 MG tablet Take 1 tablet (600 mg total) by mouth every 6 (six) hours as needed. 05/29/22  Yes Lynwood Lenis, PA-C  levalbuterol  (XOPENEX  HFA) 45 MCG/ACT inhaler Inhale 1 puff into the lungs every 6 (six) hours as needed for wheezing. 05/10/22  Yes Kennyth Domino, FNP  pantoprazole  (PROTONIX ) 40 MG tablet Take 1 tablet (40 mg total) by mouth daily. 02/19/24  Yes Hunsucker, Donnice SAUNDERS,  MD  rosuvastatin  (CRESTOR ) 5 MG tablet Take 1 tablet (5 mg total) by mouth daily. 11/17/23  Yes Jaffe, Adam R, DO  silodosin  (RAPAFLO ) 8 MG CAPS capsule Take 1 capsule (8 mg total) by mouth daily. 10/05/23  Yes   Spacer/Aero-Holding Raguel DEVI 1 Device by Does not apply route once as needed for up to 1 dose (to use with rescue inhaler). 05/13/23  Yes Kennyth Domino, FNP    Family History Family History  Problem Relation Age of Onset   Cancer Mother    Heart disease Father    Colon polyps Father    Cancer Other    Obesity Other    Colon cancer Neg Hx    Esophageal cancer Neg Hx    Rectal cancer Neg Hx    Stomach cancer Neg Hx     Social History Social History   Tobacco Use   Smoking status: Former    Current packs/day: 0.00    Average packs/day: 1 pack/day for 20.0 years (20.0 ttl pk-yrs)    Types: Cigarettes    Start date: 03/27/1964    Quit date: 03/27/1984    Years since quitting: 40.0   Smokeless tobacco: Never  Vaping Use   Vaping status: Never Used  Substance Use Topics   Alcohol use: No   Drug use: No     Allergies   Patient has no known allergies.   Review of Systems Review of Systems  See HPI Physical Exam Triage Vital Signs ED Triage Vitals  Encounter Vitals Group     BP 03/23/24 0819 120/79     Girls Systolic BP Percentile --      Girls Diastolic BP Percentile --      Boys Systolic BP Percentile --      Boys Diastolic BP Percentile --      Pulse Rate 03/23/24 0819 (!) 51     Resp 03/23/24 0819 18     Temp 03/23/24 0819 98.3 F (36.8 C)     Temp Source 03/23/24 0819 Oral     SpO2 03/23/24 0819 96 %     Weight 03/23/24 0818 250 lb (113.4 kg)     Height 03/23/24 0818 5' 10 (1.778 m)     Head Circumference --      Peak Flow --      Pain Score 03/23/24 0818 0     Pain Loc --      Pain Education --      Exclude from Growth Chart --    No data found.  Updated Vital Signs BP 120/79 (BP Location: Right Arm)   Pulse (!) 51   Temp 98.3 F  (36.8 C) (Oral)   Resp 18   Ht 5' 10 (1.778 m)   Wt 113.4 kg   SpO2 96%   BMI 35.87 kg/m  Physical Exam Constitutional:      General: He is not in acute distress.    Appearance: He is well-developed.     Comments: Worked overnight.  Tired  HENT:     Head: Normocephalic and atraumatic.  Eyes:     Conjunctiva/sclera: Conjunctivae normal.     Pupils: Pupils are equal, round, and reactive to light.  Cardiovascular:     Rate and Rhythm: Normal rate.  Pulmonary:     Effort: Pulmonary effort is normal. No respiratory distress.  Abdominal:     Tenderness: There is no right CVA tenderness or left CVA tenderness.  Musculoskeletal:        General: Normal range of motion.     Cervical back: Normal range of motion.  Skin:    General: Skin is warm and dry.  Neurological:     Mental Status: He is alert.      UC Treatments / Results  Labs (all labs ordered are listed, but only abnormal results are displayed) Labs Reviewed  POCT URINALYSIS DIP (MANUAL ENTRY) - Abnormal; Notable for the following components:      Result Value   Color, UA other (*)    Clarity, UA cloudy (*)    Blood, UA trace-intact (*)    Leukocytes, UA Small (1+) (*)    All other components within normal limits  URINE CULTURE    EKG   Radiology No results found.  Procedures Procedures (including critical care time)  Medications Ordered in UC Medications - No data to display  Initial Impression / Assessment and Plan / UC Course  I have reviewed the triage vital signs and the nursing notes.  Pertinent labs & imaging results that were available during my care of the patient were reviewed by me and considered in my medical decision making (see chart for details).     Final Clinical Impressions(s) / UC Diagnoses   Final diagnoses:  Dysuria  Benign prostatic hyperplasia with incomplete bladder emptying     Discharge Instructions      Drink lots of water Take the cipro  2 x a day Follow  up with your urologist for problems   GOOD TO SEE YOU AGAIN   ED Prescriptions     Medication Sig Dispense Auth. Provider   ciprofloxacin  (CIPRO ) 500 MG tablet Take 1 tablet (500 mg total) by mouth 2 (two) times daily. 14 tablet Maranda Jamee [name], MD      PDMP not reviewed this encounter.   Maranda Jamee [name], MD 03/23/24 [phone]

## 2024-03-24 LAB — URINE CULTURE
Culture: NO GROWTH
Special Requests: NORMAL

## 2024-03-29 ENCOUNTER — Other Ambulatory Visit (HOSPITAL_COMMUNITY): Payer: Self-pay

## 2024-04-08 IMAGING — CT CT CHEST SUPER D W/O CM
2 of 4 series · 14 of 36 positions shown, 17 images · non-contrast
Comparison: Chest CTA 05/31/2020.

CLINICAL DATA: 65-year-old male with history of pulmonary nodule.
Followup study.

EXAM:
CT CHEST WITHOUT CONTRAST
TECHNIQUE: Multidetector CT imaging of the chest was performed using thin slice
collimation for electromagnetic bronchoscopy planning purposes,
without intravenous contrast.
RADIATION DOSE REDUCTION: This exam was performed according to the
departmental dose-optimization program which includes automated
exposure control, adjustment of the mA and/or kV according to
patient size and/or use of iterative reconstruction technique.

[Series 5: super d · axial · 0.91mm/px · z∈[-334,-51]mm · 11 of 407 slices shown, 14 images]
[im 36/407  mediastinal]
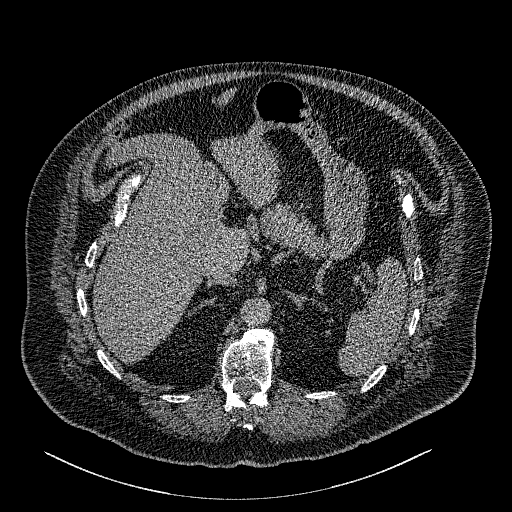
[im 36/407  lung]
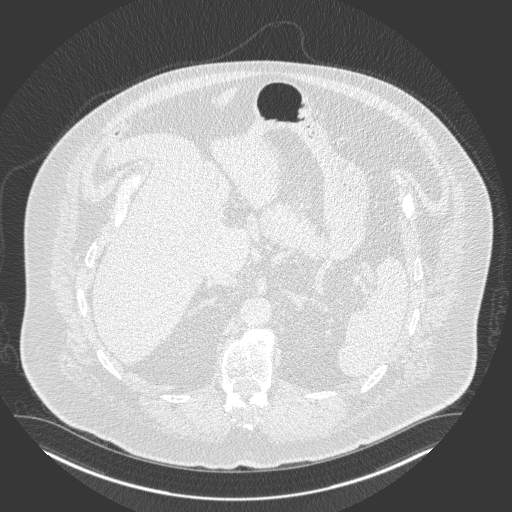
[im 71/407  lung]
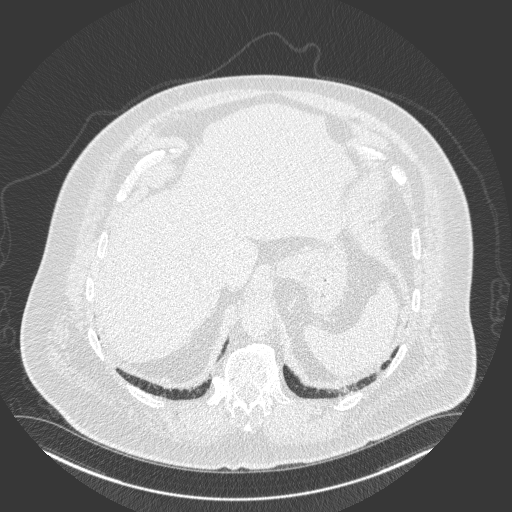
[im 106/407  lung]
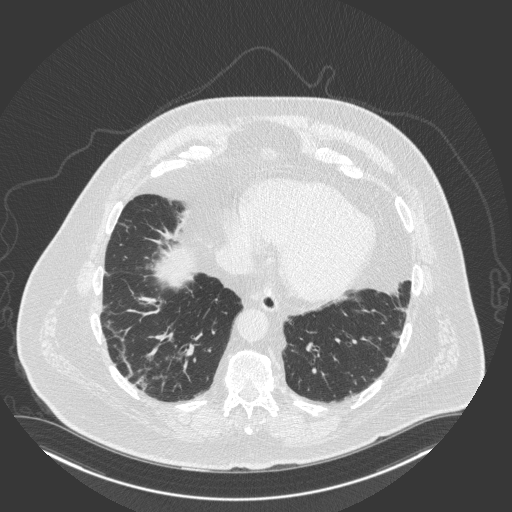
[im 142/407  lung]
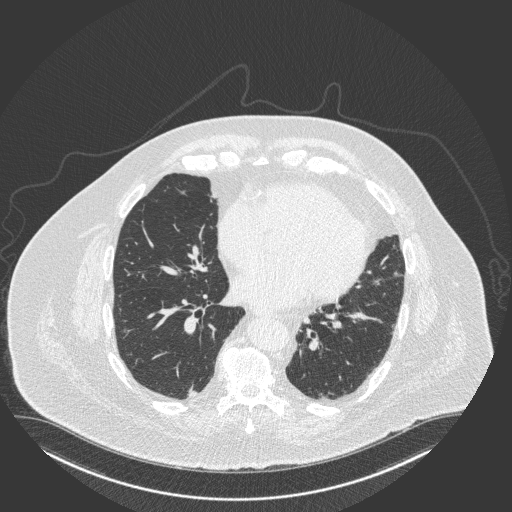
[im 177/407  mediastinal]
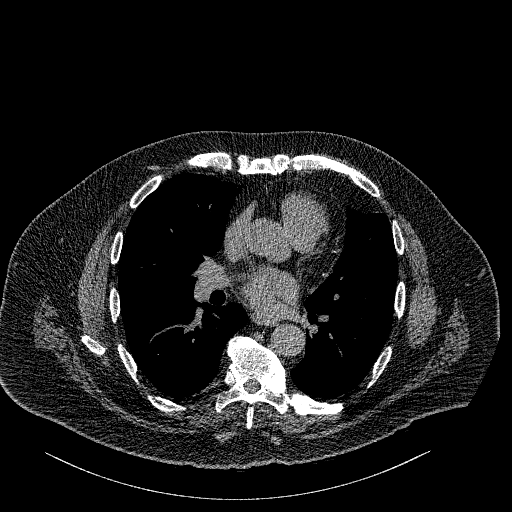
[im 177/407  lung]
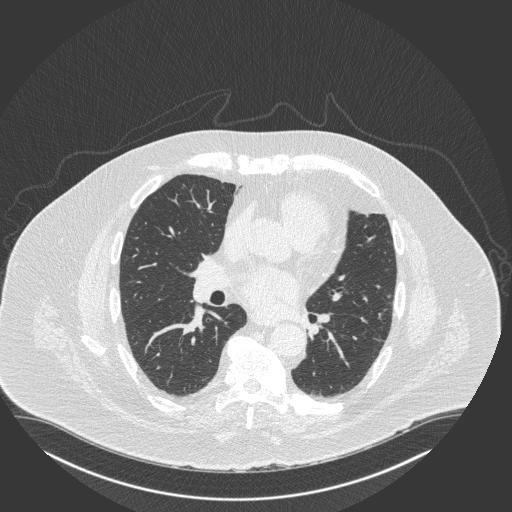
[im 212/407  lung]
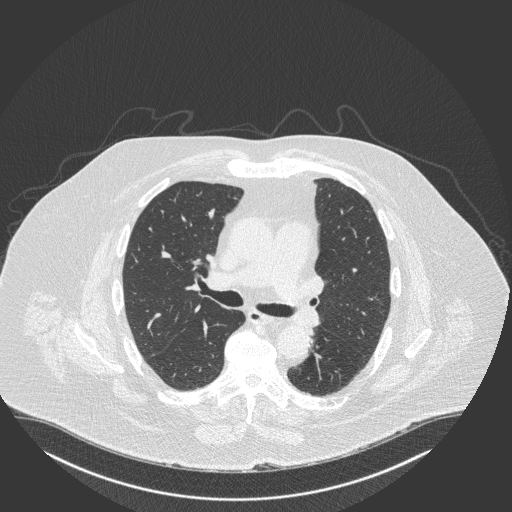
[im 248/407  lung]
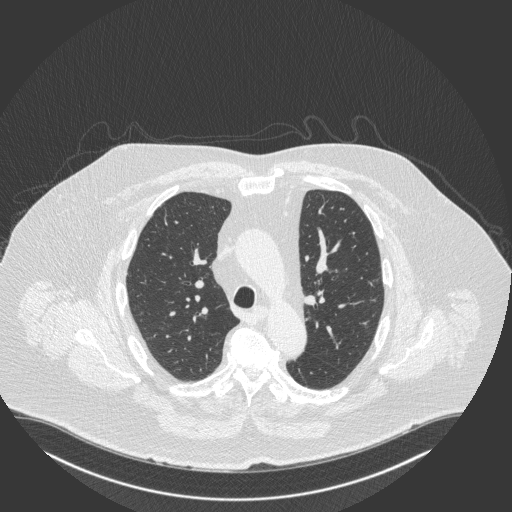
[im 283/407  lung]
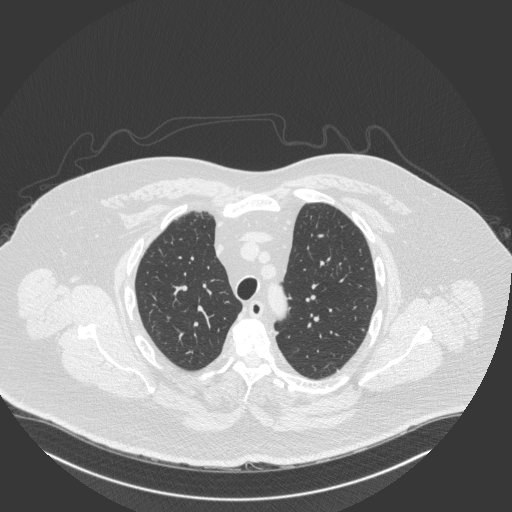
[im 318/407  mediastinal]
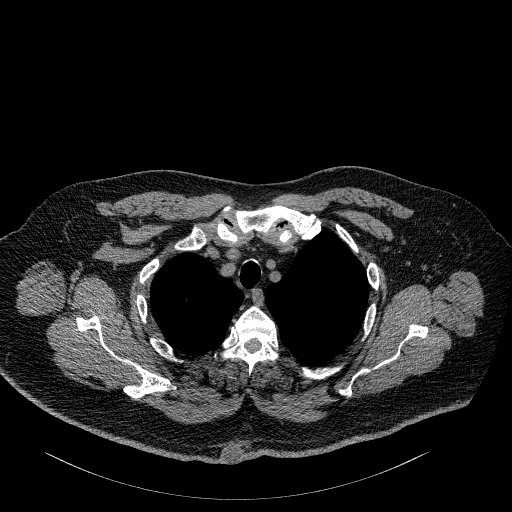
[im 318/407  lung]
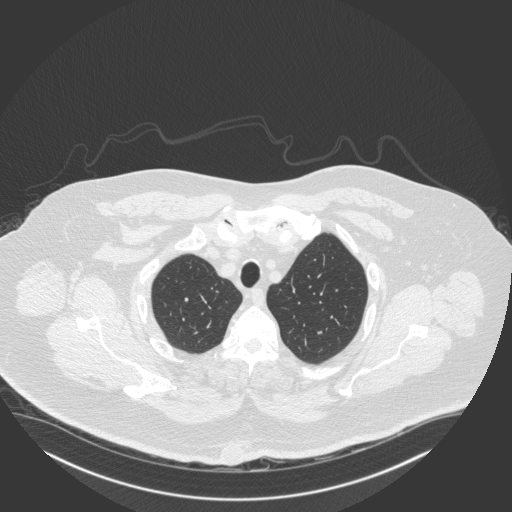
[im 354/407  lung]
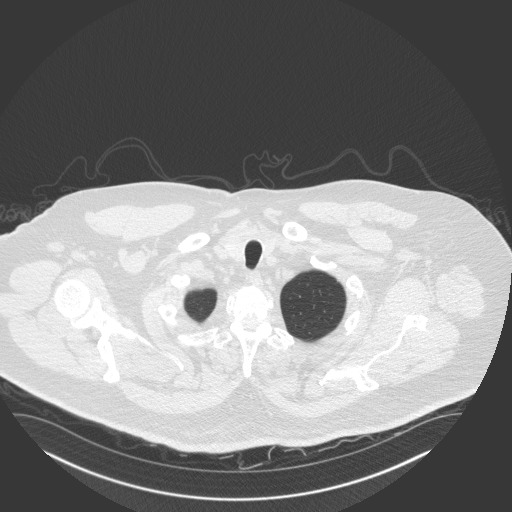
[im 389/407  lung]
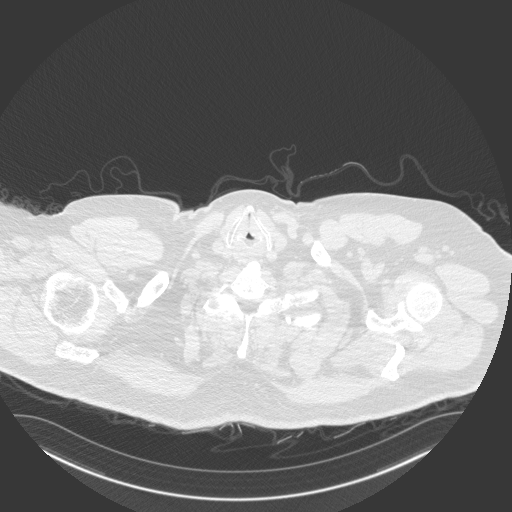

[Series 6: coronal · coronal · 0.66mm/px · 3 of 171 slices shown]
[im 35/171  lung]
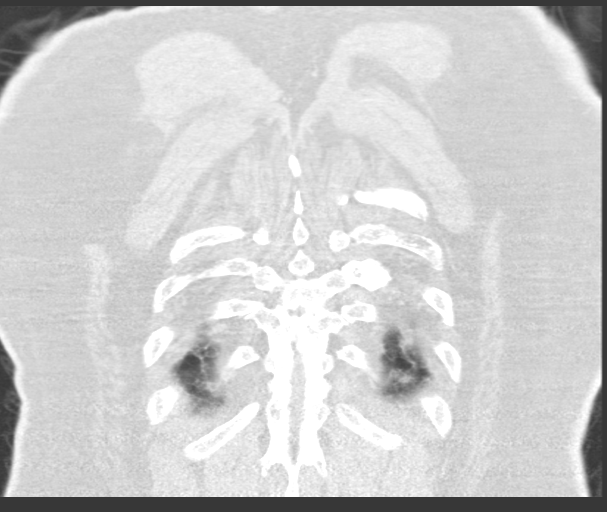
[im 69/171  lung]
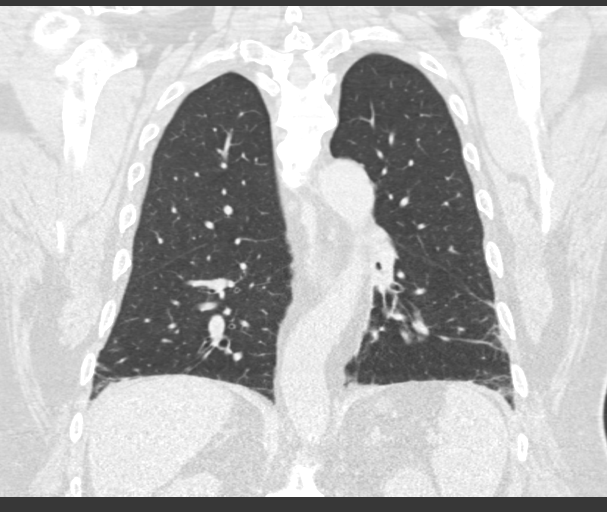
[im 103/171  lung]
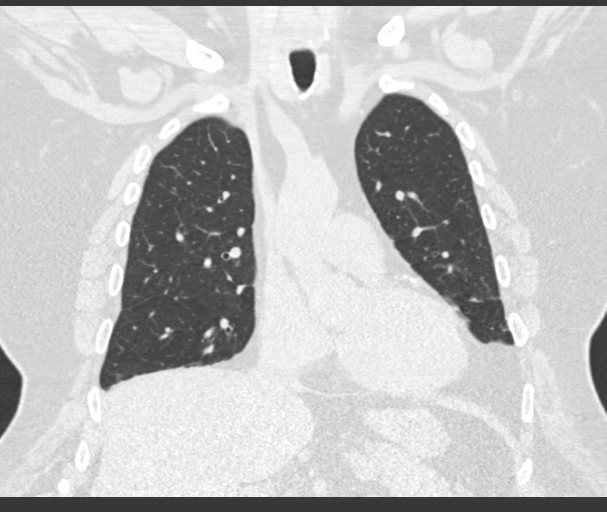

[14 of 36 positions shown; findings below may reference images not displayed]

FINDINGS: Cardiovascular: Heart size is normal. There is no significant
pericardial fluid, thickening or pericardial calcification. There is
aortic atherosclerosis, as well as atherosclerosis of the great
vessels of the mediastinum and the coronary arteries, including
calcified atherosclerotic plaque in the left main, left anterior
descending and right coronary arteries.

Mediastinum/Nodes: No pathologically enlarged mediastinal or hilar
lymph nodes. Please note that accurate exclusion of hilar adenopathy
is limited on noncontrast CT scans. Esophagus is unremarkable in
appearance. No axillary lymphadenopathy.

Lungs/Pleura: 4 mm nodule in the left upper lobe (axial image 18 of
series 3), stable, considered definitively benign. A few other
scattered 2-4 mm pulmonary nodules are noted elsewhere in the lungs,
stable in retrospect to the prior study. No other larger suspicious
pulmonary nodules or masses are noted. No acute consolidative
airspace disease. No pleural effusions. Areas of mild interstitial
prominence are noted throughout the mid to lower lungs bilaterally
where there is also some thickening of the peribronchovascular
interstitium and regional architectural distortion.

Upper Abdomen: Unremarkable.

Musculoskeletal: There are no aggressive appearing lytic or blastic
lesions noted in the visualized portions of the skeleton.
IMPRESSION: 1. Stable pulmonary nodules measuring 4 mm or less in size,
considered definitively benign requiring no future imaging
follow-up.
2. Morphologic changes in the lungs concerning for potential early
or mild interstitial lung disease. Outpatient referral to
Pulmonology for further clinical evaluation is recommended.
Follow-up high-resolution chest CT could be considered in 12 months
to assess for temporal changes in the appearance of the lung
parenchyma if clinically appropriate.
3. Aortic atherosclerosis, in addition to left main and 2 vessel
coronary artery disease. Please note that although the presence of
coronary artery calcium documents the presence of coronary artery
disease, the severity of this disease and any potential stenosis
cannot be assessed on this non-gated CT examination. Assessment for
potential risk factor modification, dietary therapy or pharmacologic
therapy may be warranted, if clinically indicated.

Aortic Atherosclerosis (722BX-[id number].H).

## 2024-04-26 ENCOUNTER — Ambulatory Visit
Admission: EM | Admit: 2024-04-26 | Discharge: 2024-04-26 | Disposition: A | Discharge status: Home / Self Care | Attending: Family Medicine | Admitting: Family Medicine

## 2024-04-26 ENCOUNTER — Other Ambulatory Visit: Payer: Self-pay

## 2024-04-26 DIAGNOSIS — H938X1 Other specified disorders of right ear: Secondary | ICD-10-CM | POA: Diagnosis not present

## 2024-04-26 MED ORDER — PREDNISONE 20 MG PO TABS: ORAL_TABLET | ORAL | 0 refills | Status: DC

## 2024-04-26 NOTE — ED Provider Notes (Signed)
 " Zachary Jacobs    CSN: [id number] Arrival date & time: 04/26/24  1738      History   Chief Complaint Chief Complaint  Patient presents with   Ear Fullness    HPI Zachary Jacobs is a 68 y.o. male.   HPI 68 year old male presents with right ear pressure for 2 days with cracking sound.  PMH significant for morbid obesity, OSA, and renal calculus.  Past Medical History:  Diagnosis Date   Allergy    Anemia    as child   COPD, mild (HCC)    very mild   History of kidney stones    OSA (obstructive sleep apnea)    s/p  surgery 1996   Pneumonia    Pre-diabetes    Renal calculus, left    Renal cyst, right    SIMPLE   Sleep apnea     cpap   dont wear latley    Patient Active Problem List   Diagnosis Date Noted   Asthma 05/10/2022   Prediabetes 07/12/2020   Unstable angina (HCC) 06/15/2020   Abnormal electrocardiogram during exercise stress test 06/15/2020   Incomplete left bundle branch block 05/31/2020   Lower urinary tract symptoms (LUTS) 03/13/2017   History of kidney stones 03/13/2017   Staghorn renal calculus 03/20/2015   Hydronephrosis     Past Surgical History:  Procedure Laterality Date   CYSTO/  RIGHT URETEROSCOPY/  STENT PLACEMENT  10/ 2001   CYSTOSCOPY/URETEROSCOPY/HOLMIUM LASER/STENT PLACEMENT Left 03/30/2015   Procedure: LEFT URETEROSCOPY/HOLMIUM LASER LITHOTRIPSY/STENT PLACEMENT, WITH  STONE BASKET EXTRACTION;  Surgeon: Mark Ottelin, MD;  Location: [facility];  Service: Urology;  Laterality: Left;   HOLMIUM LASER APPLICATION Left 03/30/2015   Procedure: HOLMIUM LASER APPLICATION;  Surgeon: Mark Ottelin, MD;  Location: [facility];  Service: Urology;  Laterality: Left;   IR URETERAL STENT LEFT NEW ACCESS W/O SEP NEPHROSTOMY CATH  05/17/2019   LEFT HEART CATH AND CORONARY ANGIOGRAPHY N/A 06/15/2020   Procedure: LEFT HEART CATH AND CORONARY ANGIOGRAPHY;  Surgeon: Claudene Pacific, MD;  Location: [facility];   Service: Cardiovascular;  Laterality: N/A;   NEPHROLITHOTOMY Left 03/20/2015   Procedure: NEPHROLITHOTOMY PERCUTANEOUS;  Surgeon: Mark Ottelin, MD;  Location: [facility];  Service: Urology;  Laterality: Left;   NEPHROLITHOTOMY Left 05/17/2019   Procedure: NEPHROLITHOTOMY PERCUTANEOUS;  Surgeon: Ottelin, Mark, MD;  Location: [facility];  Service: Urology;  Laterality: Left;   TONSILLECTOMY     as child   UVULOPALATOPHARYNGOPLASTY  1996   nasal surgery       Home Medications    Prior to Admission medications  Medication Sig Start Date End Date Taking? Authorizing Provider  predniSONE  (DELTASONE ) 20 MG tablet Take 3 tabs PO daily x 5 days. 04/26/24  Yes Teddy Sharper, FNP  aspirin  EC 81 MG tablet Take 81 mg by mouth daily. Swallow whole.    [provider]  azelastine  (ASTELIN ) 0.1 % nasal spray Place 2 sprays into both nostrils 2 (two) times daily. 09/08/23 03/25/24  Tobie Eldora NOVAK, MD  ciprofloxacin  (CIPRO ) 500 MG tablet Take 1 tablet (500 mg total) by mouth 2 (two) times daily. 03/23/24   Maranda Jamee [name], MD  finasteride  (PROSCAR ) 5 MG tablet Take 1 tablet (5 mg total) by mouth daily. 09/18/22     fluticasone  (FLONASE ) 50 MCG/ACT nasal spray Place into the nose. 10/12/20   [provider]  fluticasone  furoate-vilanterol (BREO ELLIPTA ) 200-25 MCG/ACT AEPB Inhale 1 puff into the lungs  daily. 05/05/23   Hunsucker, Donnice SAUNDERS, MD  ibuprofen  (ADVIL ) 600 MG tablet Take 1 tablet (600 mg total) by mouth every 6 (six) hours as needed. 05/29/22   Lynwood Lenis, PA-C  levalbuterol  (XOPENEX  HFA) 45 MCG/ACT inhaler Inhale 1 puff into the lungs every 6 (six) hours as needed for wheezing. 05/10/22   Kennyth Domino, FNP  pantoprazole  (PROTONIX ) 40 MG tablet Take 1 tablet (40 mg total) by mouth daily. 02/19/24   Hunsucker, Donnice SAUNDERS, MD  rosuvastatin  (CRESTOR ) 5 MG tablet Take 1 tablet (5 mg total) by mouth daily. 11/17/23   Skeet Juliene SAUNDERS, DO  silodosin  (RAPAFLO ) 8 MG CAPS capsule Take 1 capsule (8 mg  total) by mouth daily. 10/05/23     Spacer/Aero-Holding Chambers DEVI 1 Device by Does not apply route once as needed for up to 1 dose (to use with rescue inhaler). 05/13/23   Kennyth Domino, FNP    Family History Family History  Problem Relation Age of Onset   Cancer Mother    Heart disease Father    Colon polyps Father    Cancer Other    Obesity Other    Colon cancer Neg Hx    Esophageal cancer Neg Hx    Rectal cancer Neg Hx    Stomach cancer Neg Hx     Social History Social History[1]   Allergies   Patient has no known allergies.   Review of Systems Review of Systems  HENT:  Positive for ear pain.      Physical Exam Triage Vital Signs ED Triage Vitals  Encounter Vitals Group     BP      Girls Systolic BP Percentile      Girls Diastolic BP Percentile      Boys Systolic BP Percentile      Boys Diastolic BP Percentile      Pulse      Resp      Temp      Temp src      SpO2      Weight      Height      Head Circumference      Peak Flow      Pain Score      Pain Loc      Pain Education      Exclude from Growth Chart    No data found.  Updated Vital Signs BP 105/69 (BP Location: Right Arm)   Pulse 68   Temp 98.9 F (37.2 C) (Oral)   Resp 18   Ht 5' 10 (1.778 m)   Wt 250 lb (113.4 kg)   SpO2 95%   BMI 35.87 kg/m    Physical Exam Vitals and nursing note reviewed.  Constitutional:      Appearance: Normal appearance. He is normal weight.  HENT:     Head: Normocephalic and atraumatic.     Mouth/Throat:     Mouth: Mucous membranes are moist.     Pharynx: Oropharynx is clear.  Eyes:     Extraocular Movements: Extraocular movements intact.     Conjunctiva/sclera: Conjunctivae normal.     Pupils: Pupils are equal, round, and reactive to light.  Cardiovascular:     Rate and Rhythm: Normal rate and regular rhythm.     Heart sounds: Normal heart sounds.  Pulmonary:     Effort: Pulmonary effort is normal.     Breath sounds: Normal breath sounds. No  wheezing, rhonchi or rales.  Musculoskeletal:  General: Normal range of motion.  Skin:    General: Skin is warm and dry.  Neurological:     General: No focal deficit present.     Mental Status: He is alert and oriented to person, place, and time. Mental status is at baseline.  Psychiatric:        Mood and Affect: Mood normal.        Behavior: Behavior normal.      UC Treatments / Results  Labs (all labs ordered are listed, but only abnormal results are displayed) Labs Reviewed - No data to display  EKG   Radiology No results found.  Procedures Procedures (including critical Jacobs time)  Medications Ordered in UC Medications - No data to display  Initial Impression / Assessment and Plan / UC Course  I have reviewed the triage vital signs and the nursing notes.  Pertinent labs & imaging results that were available during my Jacobs of the patient were reviewed by me and considered in my medical decision making (see chart for details).     MDM: 1.  Ear fullness, right-Rx'd prednisone  20 mg tablet: Take 3 tablets p.o. daily x 5 days. Advised patient take medication as directed with food to completion.  Encouraged increase daily water intake to 64 ounces per day while taking this medication.  Advised if symptoms worsen and/or unresolved please follow-up with your PCP or here for further evaluation.  Patient discharged home, hemodynamically stable. Final Clinical Impressions(s) / UC Diagnoses   Final diagnoses:  Ear fullness, right     Discharge Instructions      Advised patient take medication as directed with food to completion.  Encouraged increase daily water intake to 64 ounces per day while taking this medication.  Advised if symptoms worsen and/or unresolved please follow-up with your PCP or here for further evaluation.     ED Prescriptions     Medication Sig Dispense Auth. Provider   predniSONE  (DELTASONE ) 20 MG tablet Take 3 tabs PO daily x 5 days. 15  tablet Vikram Tillett, FNP      PDMP not reviewed this encounter.    [1]  Social History Tobacco Use   Smoking status: Former    Current packs/day: 0.00    Average packs/day: 1 pack/day for 20.0 years (20.0 ttl pk-yrs)    Types: Cigarettes    Start date: 03/27/1964    Quit date: 03/27/1984    Years since quitting: 40.1   Smokeless tobacco: Never  Vaping Use   Vaping status: Never Used  Substance Use Topics   Alcohol use: No   Drug use: No     Teddy Sharper, FNP 04/26/24 1903  "

## 2024-04-26 NOTE — Discharge Instructions (Addendum)
 Advised patient take medication as directed with food to completion.  Encouraged increase daily water intake to 64 ounces per day while taking this medication.  Advised if symptoms worsen and/or unresolved please follow-up with your PCP or here for further evaluation.

## 2024-04-26 NOTE — ED Triage Notes (Signed)
 Pt presenting with c/o  right ear pressure x 2 days and new onset  one hour ago of crackling occurring when he chews. Pt stated he took a nose spray and Aspirin  two days ago for ear pressure  which was ineffective.

## 2024-05-13 ENCOUNTER — Other Ambulatory Visit (HOSPITAL_COMMUNITY): Payer: Self-pay

## 2024-05-16 ENCOUNTER — Other Ambulatory Visit (HOSPITAL_COMMUNITY): Payer: Self-pay

## 2024-05-17 ENCOUNTER — Ambulatory Visit: Payer: Self-pay | Admitting: Pulmonary Disease

## 2024-05-17 ENCOUNTER — Other Ambulatory Visit: Payer: Self-pay

## 2024-05-17 ENCOUNTER — Ambulatory Visit

## 2024-05-17 ENCOUNTER — Ambulatory Visit
Admission: RE | Admit: 2024-05-17 | Discharge: 2024-05-17 | Disposition: A | Discharge status: Home / Self Care | Attending: Family Medicine | Admitting: Family Medicine

## 2024-05-17 ENCOUNTER — Other Ambulatory Visit (HOSPITAL_COMMUNITY): Payer: Self-pay

## 2024-05-17 VITALS — BP 130/84 | HR 80 | Temp 98.3°F | Resp 17

## 2024-05-17 DIAGNOSIS — M25469 Effusion, unspecified knee: Secondary | ICD-10-CM

## 2024-05-17 DIAGNOSIS — M7989 Other specified soft tissue disorders: Secondary | ICD-10-CM | POA: Diagnosis not present

## 2024-05-17 DIAGNOSIS — I82462 Acute embolism and thrombosis of left calf muscular vein: Secondary | ICD-10-CM

## 2024-05-17 DIAGNOSIS — D235 Other benign neoplasm of skin of trunk: Secondary | ICD-10-CM | POA: Diagnosis not present

## 2024-05-17 MED ORDER — APIXABAN (ELIQUIS) VTE STARTER PACK (10MG AND 5MG)
ORAL_TABLET | ORAL | 0 refills | Status: AC
  Filled 2024-05-17: qty 74, 30d supply, fill #0

## 2024-05-17 NOTE — ED Provider Notes (Signed)
 " Zachary Jacobs    CSN: [id number] Arrival date & time: 05/17/24  1351      History   Chief Complaint Chief Complaint  Patient presents with   Knee Pain    LT    HPI Zachary Jacobs is a 69 y.o. male.    Knee Pain  Patient is here for pain to the back of the calf,. Knee, and lower leg for about a week.  The pain is worsening.  He has noted some swelling to the back of the leg/knee as well.  No known injury.  He was sick the week before and did not do much of anything.  Pain with movement, less pain if still.  No sob noted . No h/o DVT.  He does have a h/o TIA, currently on ASA and statin for this.   He also has a cyst to the upper back that has been worsening.  He has had this removed in the past, but coming back. Painful at times.        Past Medical History:  Diagnosis Date   Allergy    Anemia    as child   COPD, mild (HCC)    very mild   History of kidney stones    OSA (obstructive sleep apnea)    s/p  surgery 1996   Pneumonia    Pre-diabetes    Renal calculus, left    Renal cyst, right    SIMPLE   Sleep apnea     cpap   dont wear latley    Patient Active Problem List   Diagnosis Date Noted   Asthma 05/10/2022   Prediabetes 07/12/2020   Unstable angina (HCC) 06/15/2020   Abnormal electrocardiogram during exercise stress test 06/15/2020   Incomplete left bundle branch block 05/31/2020   Lower urinary tract symptoms (LUTS) 03/13/2017   History of kidney stones 03/13/2017   Staghorn renal calculus 03/20/2015   Hydronephrosis     Past Surgical History:  Procedure Laterality Date   CYSTO/  RIGHT URETEROSCOPY/  STENT PLACEMENT  10/ 2001   CYSTOSCOPY/URETEROSCOPY/HOLMIUM LASER/STENT PLACEMENT Left 03/30/2015   Procedure: LEFT URETEROSCOPY/HOLMIUM LASER LITHOTRIPSY/STENT PLACEMENT, WITH  STONE BASKET EXTRACTION;  Surgeon: Mark Ottelin, MD;  Location: [facility];  Service: Urology;  Laterality: Left;   HOLMIUM LASER  APPLICATION Left 03/30/2015   Procedure: HOLMIUM LASER APPLICATION;  Surgeon: Mark Ottelin, MD;  Location: [facility];  Service: Urology;  Laterality: Left;   IR URETERAL STENT LEFT NEW ACCESS W/O SEP NEPHROSTOMY CATH  05/17/2019   LEFT HEART CATH AND CORONARY ANGIOGRAPHY N/A 06/15/2020   Procedure: LEFT HEART CATH AND CORONARY ANGIOGRAPHY;  Surgeon: Claudene Pacific, MD;  Location: [facility];  Service: Cardiovascular;  Laterality: N/A;   NEPHROLITHOTOMY Left 03/20/2015   Procedure: NEPHROLITHOTOMY PERCUTANEOUS;  Surgeon: Mark Ottelin, MD;  Location: [facility];  Service: Urology;  Laterality: Left;   NEPHROLITHOTOMY Left 05/17/2019   Procedure: NEPHROLITHOTOMY PERCUTANEOUS;  Surgeon: Ottelin, Mark, MD;  Location: [facility];  Service: Urology;  Laterality: Left;   TONSILLECTOMY     as child   UVULOPALATOPHARYNGOPLASTY  1996   nasal surgery       Home Medications    Prior to Admission medications  Medication Sig Start Date End Date Taking? Authorizing Provider  aspirin  EC 81 MG tablet Take 81 mg by mouth daily. Swallow whole.    [provider]  azelastine  (ASTELIN ) 0.1 % nasal spray Place 2 sprays into both nostrils  2 (two) times daily. 09/08/23 06/10/24  Tobie Eldora NOVAK, MD  ciprofloxacin  (CIPRO ) 500 MG tablet Take 1 tablet (500 mg total) by mouth 2 (two) times daily. 03/23/24   Maranda Jamee [name], MD  finasteride  (PROSCAR ) 5 MG tablet Take 1 tablet (5 mg total) by mouth daily. 09/18/22     fluticasone  (FLONASE ) 50 MCG/ACT nasal spray Place into the nose. 10/12/20   [provider]  fluticasone  furoate-vilanterol (BREO ELLIPTA ) 200-25 MCG/ACT AEPB Inhale 1 puff into the lungs daily. 05/05/23   Hunsucker, Donnice SAUNDERS, MD  ibuprofen  (ADVIL ) 600 MG tablet Take 1 tablet (600 mg total) by mouth every 6 (six) hours as needed. 05/29/22   Lynwood Lenis, PA-C  levalbuterol  (XOPENEX  HFA) 45 MCG/ACT inhaler Inhale 1 puff into the lungs every 6 (six) hours as needed for wheezing.  05/10/22   Kennyth Domino, FNP  pantoprazole  (PROTONIX ) 40 MG tablet Take 1 tablet (40 mg total) by mouth daily. 02/19/24   Hunsucker, Donnice SAUNDERS, MD  predniSONE  (DELTASONE ) 20 MG tablet Take 3 tabs PO daily x 5 days. 04/26/24   Ragan, Michael, FNP  rosuvastatin  (CRESTOR ) 5 MG tablet Take 1 tablet (5 mg total) by mouth daily. 11/17/23   Skeet Juliene SAUNDERS, DO  silodosin  (RAPAFLO ) 8 MG CAPS capsule Take 1 capsule (8 mg total) by mouth daily. 10/05/23     Spacer/Aero-Holding Chambers DEVI 1 Device by Does not apply route once as needed for up to 1 dose (to use with rescue inhaler). 05/13/23   Kennyth Domino, FNP    Family History Family History  Problem Relation Age of Onset   Cancer Mother    Heart disease Father    Colon polyps Father    Cancer Other    Obesity Other    Colon cancer Neg Hx    Esophageal cancer Neg Hx    Rectal cancer Neg Hx    Stomach cancer Neg Hx     Social History Social History[1]   Allergies   Patient has no known allergies.   Review of Systems Review of Systems  Constitutional: Negative.   HENT: Negative.    Respiratory: Negative.    Cardiovascular: Negative.   Gastrointestinal: Negative.   Genitourinary: Negative.   Musculoskeletal:  Positive for joint swelling.     Physical Exam Triage Vital Signs ED Triage Vitals  Encounter Vitals Group     BP 05/17/24 1358 130/84     Girls Systolic BP Percentile --      Girls Diastolic BP Percentile --      Boys Systolic BP Percentile --      Boys Diastolic BP Percentile --      Pulse Rate 05/17/24 1358 80     Resp 05/17/24 1358 17     Temp 05/17/24 1358 98.3 F (36.8 C)     Temp Source 05/17/24 1358 Oral     SpO2 05/17/24 1358 96 %     Weight --      Height --      Head Circumference --      Peak Flow --      Pain Score 05/17/24 1359 3     Pain Loc --      Pain Education --      Exclude from Growth Chart --    No data found.  Updated Vital Signs BP 130/84 (BP Location: Right Arm)   Pulse 80   Temp  98.3 F (36.8 C) (Oral)   Resp 17   SpO2 96%  Visual Acuity Right Eye Distance:   Left Eye Distance:   Bilateral Distance:    Right Eye Near:   Left Eye Near:    Bilateral Near:     Physical Exam Constitutional:      Appearance: Normal appearance. He is normal weight.  Cardiovascular:     Rate and Rhythm: Normal rate and regular rhythm.  Pulmonary:     Effort: Pulmonary effort is normal.     Breath sounds: Normal breath sounds.  Musculoskeletal:     Comments: No obvious swelling to the knee;  He does have swelling/fullness to the back of the left knee, as well as the upper calf and lower back of the thigh;  he has tenderness at this site;  pain with flexion/extension of the knee  Skin:    Comments: Moderate sized raised cyst/lump to the upper back on the right.  Tender;  no erythema or fluctuance is noted;   Neurological:     Mental Status: He is alert.      UC Treatments / Results  Labs (all labs ordered are listed, but only abnormal results are displayed) Labs Reviewed - No data to display  EKG   Radiology US  Venous Img Lower Unilateral Left (DVT) Result Date: 05/17/2024 CLINICAL DATA:  Left leg swelling. EXAM: LEFT LOWER EXTREMITY VENOUS DOPPLER ULTRASOUND TECHNIQUE: Gray-scale sonography with graded compression, as well as color Doppler and duplex ultrasound were performed to evaluate the lower extremity deep venous systems from the level of the common femoral vein and including the common femoral, femoral, profunda femoral, popliteal and calf veins including the posterior tibial, peroneal and gastrocnemius veins when visible. Spectral Doppler was utilized to evaluate flow at rest and with distal augmentation maneuvers in the common femoral, femoral and popliteal veins. COMPARISON:  None Available. FINDINGS: Contralateral Common Femoral Vein: Respiratory phasicity is normal and symmetric with the symptomatic side. No evidence of thrombus. Normal compressibility. Common  Femoral Vein: No evidence of thrombus. Normal compressibility, respiratory phasicity and response to augmentation. Saphenofemoral Junction: No evidence of thrombus. Normal compressibility and flow on color Doppler imaging. Profunda Femoral Vein: No evidence of thrombus. Normal compressibility. Femoral Vein: Positive for thrombus. Thrombus in the proximal, mid and distal femoral vein. No significant flow in the femoral vein. Popliteal Vein: Positive for thrombus. Occlusive thrombus in left popliteal vein. Calf Veins: Positive for thrombus. Thrombus involving posterior tibial veins. Limited evaluation of the peroneal veins. Other Findings: Heterogeneous superficial structure in the popliteal fossa measures 4.5 x 1.2 x 2.8 cm. This is not compatible with a simple cyst. Great saphenous vein is compressible without thrombus. Heterogeneous structure in the left groin probably represents a lymph node. IMPRESSION: 1. Positive for deep venous thrombosis in the left lower extremity. Thrombus involving the left femoral vein, popliteal vein and calf veins. 2. Indeterminate heterogeneous superficial structure in the popliteal fossa. This is not compatible with a simple cyst. This could represent a complex fluid collection or focal soft tissue edema. Recommend follow-up imaging with attention to this area. Electronically Signed   By: Juliene Balder M.D.   On: 05/17/2024 15:05    Procedures Procedures (including critical Jacobs time)  Medications Ordered in UC Medications - No data to display  Initial Impression / Assessment and Plan / UC Course  I have reviewed the triage vital signs and the nursing notes.  Pertinent labs & imaging results that were available during my Jacobs of the patient were reviewed by me and considered in my medical decision  making (see chart for details).   Final Clinical Impressions(s) / UC Diagnoses   Final diagnoses:  Acute deep vein thrombosis (DVT) of calf muscle vein of left lower extremity  (HCC)  Dermoid cyst of skin of back  Swelling of knee     Discharge Instructions      You were diagnosed with a DVT of the left leg.  I have sent out medication to start treatment.  I have sent out Eliquis  10mg  bid x 7 days, and then decrease to 5mg  bid thereafter.  I have placed a referral for you to the [facility], located at [address] Floor, [place], [place], [address].  Phone is [phone].  They should be calling you to make an appointment.  If you do not hear from them by end of the week, please reach out to them for an appointment.  STOP THE ASPRIIN at this time.  You do have an area of swelling behind the knee, that seems separate from the DVT.  It is recommended to have further imaging of this area eventually.  Please follow up with your primary Jacobs provider in this regard.  For the cyst on your back, you should follow up with your primary Jacobs, or see a general surgeon for removal of this.     ED Prescriptions     Medication Sig Dispense Auth. Provider   APIXABAN  (ELIQUIS ) VTE STARTER PACK (10MG  AND 5MG ) Take as directed on package: start with two-5mg  tablets twice daily for 7 days. On day 8, switch to one-5mg  tablet twice daily. 74 each Darral Longs, MD      PDMP not reviewed this encounter.     [1]  Social History Tobacco Use   Smoking status: Former    Current packs/day: 0.00    Average packs/day: 1 pack/day for 20.0 years (20.0 ttl pk-yrs)    Types: Cigarettes    Start date: 03/27/1964    Quit date: 03/27/1984    Years since quitting: 40.1   Smokeless tobacco: Never  Vaping Use   Vaping status: Never Used  Substance Use Topics   Alcohol use: No   Drug use: No     Darral Longs, MD 05/17/24 1535  "

## 2024-05-17 NOTE — Telephone Encounter (Signed)
 [email or web address] Pulmonary Triage - Initial Assessment Questions Chief Complaint (e.g., cough, sob, wheezing, fever, chills, sweat or additional symptoms) *Go to specific symptom protocol after initial questions. SOB  How long have symptoms been present? Few weeks  Have you tested for COVID or Flu? Note: If not, ask patient if a home test can be taken. If so, instruct patient to call back for positive results. No  MEDICINES:   Have you used any OTC meds to help with symptoms? No If yes, ask What medications?   Have you used your inhalers/maintenance medication? No If yes, What medications?   If inhaler, ask How many puffs and how often? Note: Review instructions on medication in the chart.   OXYGEN : Do you wear supplemental oxygen ? No If yes, How many liters are you supposed to use?   Do you monitor your oxygen  levels? No If yes, What is your reading (oxygen  level) today?   What is your usual oxygen  saturation reading?  (Note: Pulmonary O2 sats should be 90% or greater)    Reason for Triage: Difficult Breathing, Shortness of breath  Reason for Disposition  [1] MILD difficulty breathing (e.g., minimal/no SOB at rest, SOB with walking, pulse < 100) AND [2] NEW-onset or WORSE than normal  Answer Assessment - Initial Assessment Questions Going to UC today. Office appt made Friday at pt request   1. RESPIRATORY STATUS: Describe your breathing? (e.g., wheezing, shortness of breath, unable to speak, severe coughing)      sob 2. ONSET: When did this breathing problem begin?      Few weeks 3. PATTERN Does the difficult breathing come and go, or has it been constant since it started?      intermittent 4. SEVERITY: How bad is your breathing? (e.g., mild, moderate, severe)      mild 5. RECURRENT SYMPTOM: Have you had difficulty breathing before? If Yes, ask: When was the last time? and What happened that time?      yes 6. CARDIAC HISTORY: Do you have  any history of heart disease? (e.g., heart attack, angina, bypass surgery, angioplasty)       7. LUNG HISTORY: Do you have any history of lung disease?  (e.g., pulmonary embolus, asthma, emphysema)     asthma 8. CAUSE: What do you think is causing the breathing problem?      unknown 9. OTHER SYMPTOMS: Do you have any other symptoms? (e.g., chest pain, cough, dizziness, fever, runny nose)     Denies cough, cp 10. O2 SATURATION MONITOR:  Do you use an oxygen  saturation monitor (pulse oximeter) at home? If Yes, ask: What is your reading (oxygen  level) today? What is your usual oxygen  saturation reading? (e.g., 95%)       Hasn't checked  Protocols used: Breathing Difficulty-A-AH

## 2024-05-17 NOTE — Discharge Instructions (Addendum)
 You were diagnosed with a DVT of the left leg.  I have sent out medication to start treatment.  I have sent out Eliquis  10mg  bid x 7 days, and then decrease to 5mg  bid thereafter.  I have placed a referral for you to the [facility], located at [address], [address] Floor, [address] [employer] [place], [place], [address].  Phone is [phone].  They should be calling you to make an appointment.  If you do not hear from them by end of the week, please reach out to them for an appointment.  STOP THE ASPRIIN at this time.  You do have an area of swelling behind the knee, that seems separate from the DVT.  It is recommended to have further imaging of this area eventually.  Please follow up with your primary care provider in this regard.  For the cyst on your back, you should follow up with your primary care, or see a general surgeon for removal of this.

## 2024-05-17 NOTE — ED Triage Notes (Signed)
 Pt c/o LT knee and calf pain x 1 week. States has noticed some swelling. Denies injury or hx of blood clots. Ibuprofen  prn.

## 2024-05-18 NOTE — Telephone Encounter (Deleted)
 Patient called yesterday with shortness of breath, went to UC had an ultrasound that was positive for DVT,has upcoming appointment with you,okay to wait to evaluate sob after US  or

## 2024-05-18 NOTE — Progress Notes (Unsigned)
 " DVT Clinic Note  Name: Zachary Jacobs     MRN: [id number]     DOB: Feb 28, 1956     Sex: male  PCP: Tanda Bleacher, MD  Today's Visit: Visit Information: Initial Visit  Referred to DVT Clinic by: Urgent Care - Zachary Longs, MD Referred to CPP by: Dr. Sheree Reason for referral:  Chief Complaint  Patient presents with   Med Management - DVT   HISTORY OF PRESENT ILLNESS: Zachary Jacobs is a 69 y.o. male who presents after diagnosis of DVT for medication management. Patient was diagnosed with DVT on 05/17/24 after reporting to urgent care with complaints of pain and swelling in the calf, knee, and lower leg for the past week. The pain waxes and wanes with more pain reported during movement. Patient reported being sick the week prior and was not active. Venous ultrasound was ordered and completed with evidence of DVT in the left lower extremity involving the left femoral vein, popliteal vein and calf veins. Patient was started on Eliquis  Starter Pack. Today, patient is ambulating independently and accompanied by his wife. Reports that the week prior to diagnosis, he was sick with a possible GI bug. Endorses being dehydrated and immobile for a few days. Denies any recent surgery and long travel. Leg and knee pain started after recovering from GI bug. Patient would previously take ibuprofen  for knee pain. He has stopped taking ibuprofen  since starting Eliquis . Patient is taking aspirin  for TIA/mini stroke that happened December 2024. Confirmed that he stopped taking aspirin  as directed at urgent care. Patient has been tolerating Eliquis  so far. Pain symptoms have started to resolve. Denies any abnormal bruising or bleeding.  Positive Thrombotic Risk Factors: Bed rest >72 hours within 3 month, Obesity, Older Age Bleeding Risk Factors: Age >65 years, Antiplatelet therapy, Prior stroke  Negative Thrombotic Risk Factors: Previous VTE, Recent surgery (within 3 months), Recent trauma (within 3 months),  Recent admission to hospital with acute illness (within 3 months), Paralysis, paresis, or recent plaster cast immobilization of lower extremity, Central venous catheterization, Bed rest >72 hours within 3 months, Sedentary journey lasting >8 hours within 4 weeks, Pregnancy, Within 6 weeks postpartum, Recent cesarean section (within 3 months), Estrogen therapy, Testosterone therapy, Erythropoiesis-stimulating agent, Recent COVID diagnosis (within 3 months), Active cancer, Non-malignant, chronic inflammatory condition, Known thrombophilic condition, Smoking  Rx Insurance Coverage: Commercial Rx Affordability: Eliquis : 30 days $64.13, 90 days $192.40  Rx Assistance Provided: Co-pay card - provided with copay card to reduce cost to $10/month Preferred Pharmacy: [facility]  Past Medical History:  Diagnosis Date   Allergy    Anemia    as child   COPD, mild (HCC)    very mild   History of kidney stones    OSA (obstructive sleep apnea)    s/p  surgery 1996   Pneumonia    Pre-diabetes    Renal calculus, left    Renal cyst, right    SIMPLE   Sleep apnea     cpap   dont wear latley    Past Surgical History:  Procedure Laterality Date   CYSTO/  RIGHT URETEROSCOPY/  STENT PLACEMENT  10/ 2001   CYSTOSCOPY/URETEROSCOPY/HOLMIUM LASER/STENT PLACEMENT Left 03/30/2015   Procedure: LEFT URETEROSCOPY/HOLMIUM LASER LITHOTRIPSY/STENT PLACEMENT, WITH  STONE BASKET EXTRACTION;  Surgeon: Mark Ottelin, MD;  Location: [facility];  Service: Urology;  Laterality: Left;   HOLMIUM LASER APPLICATION Left 03/30/2015   Procedure: HOLMIUM LASER APPLICATION;  Surgeon: Mark Ottelin, MD;  Location: [facility];  Service: Urology;  Laterality: Left;   IR URETERAL STENT LEFT NEW ACCESS W/O SEP NEPHROSTOMY CATH  05/17/2019   LEFT HEART CATH AND CORONARY ANGIOGRAPHY N/A 06/15/2020   Procedure: LEFT HEART CATH AND CORONARY ANGIOGRAPHY;  Surgeon: Claudene Pacific, MD;  Location: [facility];  Service: Cardiovascular;  Laterality: N/A;   NEPHROLITHOTOMY Left 03/20/2015   Procedure: NEPHROLITHOTOMY PERCUTANEOUS;  Surgeon: Mark Ottelin, MD;  Location: [facility];  Service: Urology;  Laterality: Left;   NEPHROLITHOTOMY Left 05/17/2019   Procedure: NEPHROLITHOTOMY PERCUTANEOUS;  Surgeon: Ottelin, Mark, MD;  Location: [facility];  Service: Urology;  Laterality: Left;   TONSILLECTOMY     as child   UVULOPALATOPHARYNGOPLASTY  1996   nasal surgery    Social History   Socioeconomic History   Marital status: Married    Spouse name: Not on file   Number of children: Not on file   Years of education: Not on file   Highest education level: Associate degree: occupational, scientist, product/process development, or vocational program  Occupational History   Not on file  Tobacco Use   Smoking status: Former    Current packs/day: 0.00    Average packs/day: 1 pack/day for 20.0 years (20.0 ttl pk-yrs)    Types: Cigarettes    Start date: 03/27/1964    Quit date: 03/27/1984    Years since quitting: 40.1   Smokeless tobacco: Never  Vaping Use   Vaping status: Never Used  Substance and Sexual Activity   Alcohol use: No   Drug use: No   Sexual activity: Not Currently  Other Topics Concern   Not on file  Social History Narrative   Right handed    Drinks caffeine   Lives with wife sara   Currently employed   Two floor home   Social Drivers of Health   Tobacco Use: Medium Risk (05/19/2024)   Patient History    Smoking Tobacco Use: Former    Smokeless Tobacco Use: Never    Passive Exposure: Not on file  Financial Resource Strain: Low Risk (06/30/2023)   Overall Financial Resource Strain (CARDIA)    Difficulty of Paying Living Expenses: Not hard at all  Food Insecurity: No Food Insecurity (06/30/2023)   Hunger Vital Sign    Worried About Running Out of Food in the Last Year: Never true    Ran Out of Food in the Last Year: Never true  Transportation Needs: No Transportation Needs (06/30/2023)   PRAPARE -  Administrator, Civil Service (Medical): No    Lack of Transportation (Non-Medical): No  Physical Activity: Insufficiently Active (06/30/2023)   Exercise Vital Sign    Days of Exercise per Week: 1 day    Minutes of Exercise per Session: 10 min  Stress: Stress Concern Present (06/30/2023)   [employer] of Occupational Health - Occupational Stress Questionnaire    Feeling of Stress : To some extent  Social Connections: Socially Integrated (06/30/2023)   Social Connection and Isolation Panel    Frequency of Communication with Friends and Family: Twice a week    Frequency of Social Gatherings with Friends and Family: Once a week    Attends Religious Services: 1 to 4 times per year    Active Member of [employer] or Organizations: Yes    Attends Banker Meetings: 1 to 4 times per year    Marital Status: Married  Catering Manager Violence: Not on file  Depression (PHQ2-9): Low Risk (07/02/2023)  Depression (PHQ2-9)    PHQ-2 Score: 1  Alcohol Screen: Low Risk (07/02/2023)   Alcohol Screen    Last Alcohol Screening Score (AUDIT): 0  Housing: Low Risk (07/02/2023)   Housing Stability Vital Sign    Unable to Pay for Housing in the Last Year: No    Number of Times Moved in the Last Year: 0    Homeless in the Last Year: No  Utilities: Not on file  Health Literacy: Not on file    Family History  Problem Relation Age of Onset   Cancer Mother    Heart disease Father    Colon polyps Father    Cancer Other    Obesity Other    Colon cancer Neg Hx    Esophageal cancer Neg Hx    Rectal cancer Neg Hx    Stomach cancer Neg Hx     Allergies as of 05/19/2024   (No Known Allergies)    Medications Ordered Prior to Encounter[1] REVIEW OF SYSTEMS:  Review of Systems  Respiratory:  Negative for shortness of breath.   Cardiovascular:  Positive for leg swelling. Negative for chest pain.   PHYSICAL EXAMINATION:  Vitals:   05/19/24 0955  BP: 127/74  Pulse: 83  SpO2: 96%   Weight: 255 lb 8 oz (115.9 kg)    Body mass index is 36.66 kg/m.  Physical Exam Vitals reviewed.  Pulmonary:     Effort: Pulmonary effort is normal.  Musculoskeletal:        General: Swelling present. No tenderness.     Right lower leg: No edema.     Left lower leg: No edema.   Villalta Score for Post-Thrombotic Syndrome: Pain: Absent Cramps: Mild Heaviness: Absent Paresthesia: Absent Pruritus: Absent Pretibial Edema: Absent Skin Induration: Absent Hyperpigmentation: Absent Redness: Absent Venous Ectasia: Absent Pain on calf compression: Absent Villalta Preliminary Score: 1 Is venous ulcer present?: No If venous ulcer is present and score is <15, then 15 points total are assigned: Absent Villalta Total Score: 1  LABS:  CBC     Component Value Date/Time   WBC 10.5 04/08/2023 0141   RBC 4.73 04/08/2023 0141   HGB 14.7 04/08/2023 0141   HGB 15.5 01/10/2023 1258   HCT 43.2 04/08/2023 0141   HCT 46.3 01/10/2023 1258   PLT 280 04/08/2023 0141   PLT 255 01/10/2023 1258   MCV 91.3 04/08/2023 0141   MCV 94 01/10/2023 1258   MCH 31.1 04/08/2023 0141   MCHC 34.0 04/08/2023 0141   RDW 12.4 04/08/2023 0141   RDW 12.6 01/10/2023 1258   LYMPHSABS 2.7 04/08/2023 0141   LYMPHSABS 0.8 01/10/2023 1258   MONOABS 1.1 (H) 04/08/2023 0141   EOSABS 0.6 (H) 04/08/2023 0141   EOSABS 0.1 01/10/2023 1258   BASOSABS 0.0 04/08/2023 0141   BASOSABS 0.0 01/10/2023 1258    Hepatic Function      Component Value Date/Time   PROT 7.3 04/08/2023 0141   PROT 7.7 01/10/2023 1258   ALBUMIN 3.5 04/08/2023 0141   ALBUMIN 4.2 01/10/2023 1258   AST 18 04/08/2023 0141   ALT 18 04/08/2023 0141   ALKPHOS 73 04/08/2023 0141   BILITOT 0.5 04/08/2023 0141   BILITOT 0.4 01/10/2023 1258    Renal Function   Lab Results  Component Value Date   CREATININE 0.99 04/08/2023   CREATININE 1.30 (H) 01/10/2023   CREATININE 1.24 06/15/2020    CrCl cannot be calculated (Patient's most recent lab  result is older than the maximum 21 days  allowed.).   [facility] Vascular Lab Studies:  05/17/24 LEFT LOWER EXTREMITY VENOUS DOPPLER ULTRASOUND  IMPRESSION: 1. Positive for deep venous thrombosis in the left lower extremity.Thrombus involving the left femoral vein, popliteal vein and calf veins. 2. Indeterminate heterogeneous superficial structure in the popliteal fossa. This is not compatible with a simple cyst. This could represent a complex fluid collection or focal soft tissue edema. Recommend follow-up imaging with attention to this area.  ASSESSMENT: Location of DVT: Left femoral vein, Left popliteal vein Cause of DVT: provoked by a transient risk factor  Patient without prior history of DVT who was recently diagnosed with DVT involving the left femoral vein, popliteal vein and calf veins. Patient recently had an acute illness resulting in immobilization for a few days. Denies recent surgery or prolonged travel. Started on Eliquis  Starter Pack at urgent care and has been tolerating well. Symptoms are starting to improve. Given recent acute illness and immobilization, will plan for patient to complete 3 months of anticoagulation for a first provoked DVT. Extensively counseled on Eliquis . Recommended patient use acetaminophen  in place of ibuprofen  is pain arises. He will resume taking aspirin  after Eliquis  treatment is complete. All patient's questions were answered. Provided patient with Eliquis  copay card and refills at [facility]. Measured patient for compression stockings. Provided patient with fitted size for purchase. Patient confirmed understanding of plan.  PLAN: -Continue apixaban  (Eliquis ) 10 mg twice daily for 7 days followed by 5 mg twice daily. -Expected duration of therapy: 3 months. Therapy started on 05/17/24. -Patient educated on purpose, proper use and potential adverse effects of apixaban  (Eliquis ). -Discussed importance of taking medication around the same time every  day. -Advised patient of medications to avoid (NSAIDs, aspirin  doses >100 mg daily). -Educated that Tylenol  (acetaminophen ) is the preferred analgesic to lower the risk of bleeding. -Advised patient to alert all providers of anticoagulation therapy prior to starting a new medication or having a procedure. -Emphasized importance of monitoring for signs and symptoms of bleeding (abnormal bruising, prolonged bleeding, nose bleeds, bleeding from gums, discolored urine, black tarry stools). -Educated patient to present to the ED if emergent signs and symptoms of new thrombosis occur. -Measured patient for compression stockings. -Counseled patient to wear compression stockings daily, removing at night.  Follow up: 3 months with DVT clinic  Jenkins Graces, PharmD PGY1 Pharmacy Resident  Lum Herald, PharmD, BCACP, CPP [facility] [phone]    [1]  Current Outpatient Medications on File Prior to Visit  Medication Sig Dispense Refill   APIXABAN  (ELIQUIS ) VTE STARTER PACK (10MG  AND 5MG ) Take as directed on package: start with two-5mg  tablets twice daily for 7 days. On day 8, switch to one-5mg  tablet twice daily. 74 each 0   azelastine  (ASTELIN ) 0.1 % nasal spray Place 2 sprays into both nostrils 2 (two) times daily. 30 mL 12   fluticasone  (FLONASE ) 50 MCG/ACT nasal spray Place into the nose.     fluticasone  furoate-vilanterol (BREO ELLIPTA ) 200-25 MCG/ACT AEPB Inhale 1 puff into the lungs daily. 60 each 6   ibuprofen  (ADVIL ) 600 MG tablet Take 1 tablet (600 mg total) by mouth every 6 (six) hours as needed. 30 tablet 0   levalbuterol  (XOPENEX  HFA) 45 MCG/ACT inhaler Inhale 1 puff into the lungs every 6 (six) hours as needed for wheezing. 15 g 11   pantoprazole  (PROTONIX ) 40 MG tablet Take 1 tablet (40 mg total) by mouth daily. 30 tablet 2   rosuvastatin  (CRESTOR ) 5 MG  tablet Take 1 tablet (5 mg total) by mouth daily. 30 tablet 5   silodosin  (RAPAFLO ) 8  MG CAPS capsule Take 1 capsule (8 mg total) by mouth daily. 90 capsule 3   Spacer/Aero-Holding Chambers DEVI 1 Device by Does not apply route once as needed for up to 1 dose (to use with rescue inhaler). 1 Canister 1   aspirin  EC 81 MG tablet Take 81 mg by mouth daily. Swallow whole. (Patient not taking: Reported on 05/19/2024)     No current facility-administered medications on file prior to visit.   "

## 2024-05-18 NOTE — Telephone Encounter (Signed)
 Patient is no longer complaining of shortness of breath but he has not moved around to know,advised if he develop any shortness of breath to go the ED,and he will see our office on 05/20/2024.NFN af of now

## 2024-05-19 ENCOUNTER — Telehealth: Payer: Self-pay

## 2024-05-19 ENCOUNTER — Other Ambulatory Visit: Payer: Self-pay

## 2024-05-19 ENCOUNTER — Encounter: Payer: Self-pay | Admitting: Student-PharmD

## 2024-05-19 ENCOUNTER — Other Ambulatory Visit (HOSPITAL_COMMUNITY): Payer: Self-pay

## 2024-05-19 ENCOUNTER — Ambulatory Visit: Attending: Vascular Surgery | Admitting: Student-PharmD

## 2024-05-19 VITALS — BP 127/74 | HR 83 | Wt 255.5 lb

## 2024-05-19 DIAGNOSIS — M7989 Other specified soft tissue disorders: Secondary | ICD-10-CM

## 2024-05-19 DIAGNOSIS — I82412 Acute embolism and thrombosis of left femoral vein: Secondary | ICD-10-CM

## 2024-05-19 MED ORDER — APIXABAN 5 MG PO TABS
5.0000 mg | ORAL_TABLET | Freq: Two times a day (BID) | ORAL | 0 refills | Status: AC
  Filled 2024-05-19: qty 120, 60d supply, fill #0
  Filled 2024-05-19: qty 60, 30d supply, fill #0

## 2024-05-19 NOTE — Telephone Encounter (Signed)
 Patient Advocate Encounter  Test billing for this patient's current coverage ([employer]) returns a $64.13 copay for 30 day supply of Eliquis . This patient is eligible to use a copay assistance card which would reduce the cost of the medication to $10.  This test claim was processed through [facility]- copay amounts may vary at other pharmacies due to [employer], or as the patient moves through the different stages of their insurance plan.  Rachel DEL, CPhT Rx Patient Advocate Phone: [phone]

## 2024-05-19 NOTE — Patient Instructions (Addendum)
-  Continue apixaban  (Eliquis ) 10 mg twice daily for 7 days followed by 5 mg twice daily. -Your refills have been sent to [facility]. You may need to call the pharmacy to ask them to fill this when you start to run low on your current supply.   -It is important to take your medication around the same time every day.  -Avoid NSAIDs like ibuprofen  (Advil , Motrin ) and naproxen (Aleve) as well as aspirin  doses over 100 mg daily. -Tylenol  (acetaminophen ) is the preferred over the counter pain medication to lower the risk of bleeding. -Be sure to alert all of your health care providers that you are taking an anticoagulant prior to starting a new medication or having a procedure. -Monitor for signs and symptoms of bleeding (abnormal bruising, prolonged bleeding, nose bleeds, bleeding from gums, discolored urine, black tarry stools). If you have fallen and hit your head OR if your bleeding is severe or not stopping, seek emergency care.  -Go to the emergency room if emergent signs and symptoms of new clot occur (new or worse swelling and pain in an arm or leg, shortness of breath, chest pain, fast or irregular heartbeats, lightheadedness, dizziness, fainting, coughing up blood) or if you experience a significant color change (pale or blue) in the extremity that has the DVT.  -We recommend you wear compression stockings (20-30 mmHg) as long as you are having swelling or pain. Be sure to purchase the correct size and take them off at night.   If you have any questions or need to reschedule an appointment, please call [phone]. If you are having an emergency, call 911 or present to the nearest emergency room.   What is a DVT?  -Deep vein thrombosis (DVT) is a condition in which a blood clot forms in a vein of the deep venous system which can occur in the lower leg, thigh, pelvis, arm, or neck. This condition is serious and can be life-threatening if the clot travels to the arteries of the lungs and  causing a blockage (pulmonary embolism, PE). A DVT can also damage veins in the leg, which can lead to long-term venous disease, leg pain, swelling, discoloration, and ulcers or sores (post-thrombotic syndrome).  -Treatment may include taking an anticoagulant medication to prevent more clots from forming and the current clot from growing, wearing compression stockings, and/or surgical procedures to remove or dissolve the clot.

## 2024-05-20 ENCOUNTER — Encounter: Payer: Self-pay | Admitting: Pulmonary Disease

## 2024-05-20 ENCOUNTER — Ambulatory Visit: Admitting: Pulmonary Disease

## 2024-05-20 ENCOUNTER — Other Ambulatory Visit: Payer: Self-pay

## 2024-05-20 VITALS — BP 118/76 | HR 63 | Temp 98.8°F | Ht 70.0 in | Wt 259.4 lb

## 2024-05-20 DIAGNOSIS — J4540 Moderate persistent asthma, uncomplicated: Secondary | ICD-10-CM

## 2024-05-20 DIAGNOSIS — Z87891 Personal history of nicotine dependence: Secondary | ICD-10-CM | POA: Diagnosis not present

## 2024-05-20 DIAGNOSIS — J45909 Unspecified asthma, uncomplicated: Secondary | ICD-10-CM

## 2024-05-20 DIAGNOSIS — R918 Other nonspecific abnormal finding of lung field: Secondary | ICD-10-CM
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
## 2024-05-25 ENCOUNTER — Encounter: Payer: Self-pay | Admitting: Pulmonary Disease

## 2024-05-26 ENCOUNTER — Other Ambulatory Visit: Payer: Self-pay

## 2024-05-26 NOTE — Progress Notes (Signed)
 "  Virtual Visit via Video Note:   Consent was obtained for video visit:  Yes.   Answered questions that patient had about telehealth interaction:  Yes.   I discussed the limitations, risks, security and privacy concerns of performing an evaluation and management service by telemedicine. I also discussed with the patient that there may be a patient responsible charge related to this service. The patient expressed understanding and agreed to proceed.  Pt location: Home Physician Location: office Name of referring provider:  Tanda Bleacher, MD I connected with Zachary Jacobs at patients initiation/request on 05/30/2024 at 10:30 AM EST by video enabled telemedicine application and verified that I am speaking with the correct person using two identifiers. Pt MRN:  [id number] Pt DOB:  1955-05-06 Video Participants:  Zachary Jacobs  Assessment/Plan:    Transient ischemic attack involving the left PCA territory  Currently on Eliquis  for DVT.  Once finished, would resume ASA 81mg  daily Secondary stroke prevention as managed by PCP: Rosuvastatin  5mg  daily.  LDL goal less than 70 Normotensive blood pressure Hgb A1c goal less than 7 Advised to follow up to make sure OSA is resolved.   Lifestyle modification:  Mediterranean diet and routine exercise Will order 2 week cardiac event monitor.  If a fib is detected, would remain on Eliquis  indefinitely instead of ASA.   Follow up in 6 months.   Subjective:  Zachary Jacobs is a 69 year old right-handed male with OSA and prediaberes who follows up for TIA.    UPDATE: Current medications:  Eliqius, rosuvastatin  5mg   Continued stroke workup: LDL on 10/27/2023 was 54.  2D echo on 11/16/2023 showed EF 55-60% with no significant valvular disease and negative Bubble study.  Cardiac monitor was not performed because he needs to get a new cardiologist.  Off ASA as he was diagnosed with DVT (due to inactivity and dehydration from being sick) and on  Eliquis  until April.  Has not been using the CPAP.  He needs a new filter.  But he has lost weight and feels that he is sleeping better.   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  Visual deficit (graying) noted by patient when closing either eye (lower nasal OS, upper and lower temporal OD).  Lasted about 15 minutes.  No headache or eye pain.  Blood pressure was in the 150s systolic.  He was seen in the ED where MRI of brain without contrast showed mild chronic small vessel ischemic changes but no acute findings.  CTA head and neck revealed no LVO or hemodynamically significant stenosis.  He was seen by his eye doctor a month prior for vitreous floaters.  May have a mild headache from time to time but no history of migraines or other significant headaches.     Hgb A1c from September 2024 was 6.1.   Past Medical History: Past Medical History:  Diagnosis Date   Allergy    Anemia    as child   COPD, mild (HCC)    very mild   History of kidney stones    OSA (obstructive sleep apnea)    s/p  surgery 1996   Pneumonia    Pre-diabetes    Renal calculus, left    Renal cyst, right    SIMPLE   Sleep apnea     cpap   dont wear latley    Medications: Outpatient Encounter Medications as of  05/30/2024  Medication Sig   apixaban  (ELIQUIS ) 5 MG TABS tablet Take 1 tablet (5 mg total) by mouth 2 (two) times daily. Start taking after completion of starter pack.   APIXABAN  (ELIQUIS ) VTE STARTER PACK (10MG  AND 5MG ) Take as directed on package: start with two-5mg  tablets twice daily for 7 days. On day 8, switch to one-5mg  tablet twice daily.   aspirin  EC 81 MG tablet Take 81 mg by mouth daily. Swallow whole. (Patient not taking: Reported on 05/20/2024)   azelastine  (ASTELIN ) 0.1 % nasal spray Place 2 sprays into both nostrils 2 (two) times daily. (Patient not taking: Reported on 05/20/2024)   fluticasone   (FLONASE ) 50 MCG/ACT nasal spray Place into the nose. (Patient taking differently: Place into the nose. As needed)   fluticasone  furoate-vilanterol (BREO ELLIPTA ) 200-25 MCG/ACT AEPB Inhale 1 puff into the lungs daily.   ibuprofen  (ADVIL ) 600 MG tablet Take 1 tablet (600 mg total) by mouth every 6 (six) hours as needed. (Patient not taking: Reported on 05/20/2024)   levalbuterol  (XOPENEX  HFA) 45 MCG/ACT inhaler Inhale 1 puff into the lungs every 6 (six) hours as needed for wheezing.   pantoprazole  (PROTONIX ) 40 MG tablet Take 1 tablet (40 mg total) by mouth daily.   rosuvastatin  (CRESTOR ) 5 MG tablet Take 1 tablet (5 mg total) by mouth daily.   silodosin  (RAPAFLO ) 8 MG CAPS capsule Take 1 capsule (8 mg total) by mouth daily.   Spacer/Aero-Holding Chambers DEVI 1 Device by Does not apply route once as needed for up to 1 dose (to use with rescue inhaler).   No facility-administered encounter medications on file as of 05/30/2024.    Allergies: Allergies[1]  Family History: Family History  Problem Relation Age of Onset   Cancer Mother    Heart disease Father    Colon polyps Father    Cancer Other    Obesity Other    Colon cancer Neg Hx    Esophageal cancer Neg Hx    Rectal cancer Neg Hx    Stomach cancer Neg Hx     Observations/Objective:   No acute distress.  Alert and oriented.  Speech fluent and not dysarthric.  Language intact.  Eyes orthophoric on primary gaze.  Face symmetric.   Follow up Instructions:      -I discussed the assessment and treatment plan with the patient. The patient was provided an opportunity to ask questions and all were answered. The patient agreed with the plan and demonstrated an understanding of the instructions.   The patient was advised to call back or seek an in-person evaluation if the symptoms worsen or if the condition fails to improve as anticipated.   Juliene Lamar Dunnings, DO  CC: Raguel Blush, MD           [1] No Known Allergies  "

## 2024-05-30 ENCOUNTER — Encounter: Payer: Self-pay | Admitting: Neurology

## 2024-05-30 ENCOUNTER — Telehealth: Admitting: Neurology

## 2024-05-30 DIAGNOSIS — G459 Transient cerebral ischemic attack, unspecified: Secondary | ICD-10-CM

## 2024-05-31 ENCOUNTER — Telehealth: Payer: Self-pay | Admitting: Pulmonary Disease

## 2024-05-31 ENCOUNTER — Ambulatory Visit

## 2024-05-31 DIAGNOSIS — G459 Transient cerebral ischemic attack, unspecified: Secondary | ICD-10-CM

## 2024-05-31 NOTE — Progress Notes (Unsigned)
Enrolled patient for a 14 day Zio AT monitor to be mailed to patients home  DOD to read

## 2024-05-31 NOTE — Addendum Note (Signed)
 Addended by: OZELL JESUSA PARAS on: 05/31/2024 10:34 AM   Modules accepted: Orders

## 2024-06-01 DIAGNOSIS — Z0289 Encounter for other administrative examinations: Secondary | ICD-10-CM

## 2024-06-02 ENCOUNTER — Ambulatory Visit

## 2024-06-02 ENCOUNTER — Other Ambulatory Visit (HOSPITAL_COMMUNITY): Payer: Self-pay

## 2024-06-02 VITALS — BP 112/62 | HR 65 | Ht 70.0 in | Wt 258.8 lb

## 2024-06-02 DIAGNOSIS — I2510 Atherosclerotic heart disease of native coronary artery without angina pectoris: Secondary | ICD-10-CM | POA: Diagnosis not present

## 2024-06-02 DIAGNOSIS — E785 Hyperlipidemia, unspecified: Secondary | ICD-10-CM

## 2024-06-02 DIAGNOSIS — G459 Transient cerebral ischemic attack, unspecified: Secondary | ICD-10-CM

## 2024-06-02 MED ORDER — ROSUVASTATIN CALCIUM 20 MG PO TABS
20.0000 mg | ORAL_TABLET | Freq: Every day | ORAL | 3 refills | Status: AC
  Filled 2024-06-02: qty 90, 90d supply, fill #0

## 2024-06-02 NOTE — Telephone Encounter (Signed)
 FMLA Payment collected via phone from patient.

## 2024-06-02 NOTE — Patient Instructions (Signed)
 Medication Instructions:  Crestor  increased to 20mg  daily.  *If you need a refill on your cardiac medications before your next appointment, please call your pharmacy*  Lab Work: Today, Lipid panel + APO, LDL Direct, Lipoprotein A If you have labs (blood work) drawn today and your tests are completely normal, you will receive your results only by: MyChart Message (if you have MyChart) OR A paper copy in the mail If you have any lab test that is abnormal or we need to change your treatment, we will call you to review the results.  Testing/Procedures: None   Follow-Up: At [facility], you and your health needs are our priority.  As part of our continuing mission to provide you with exceptional heart care, our providers are all part of one team.  This team includes your primary Cardiologist (physician) and Advanced Practice Providers or APPs (Physician Assistants and Nurse Practitioners) who all work together to provide you with the care you need, when you need it.  Your next appointment:   1 year(s)  Provider:   Joelle VEAR Ren Donley, MD    Other Instructions None

## 2024-06-02 NOTE — Telephone Encounter (Signed)
 FMLA has been faxed successfully to [phone] & received confirmation.  Placed completed FMLA in box upfront.

## 2024-06-02 NOTE — Progress Notes (Signed)
 "     Cardiology Office Note Date:  06/02/2024  ID:  Zachary Jacobs, DOB 04/26/1956, MRN [id number] PCP:  Tanda Bleacher, MD  Cardiologist:  Joelle VEAR Ren Donley, MD  Chief Complaint  Patient presents with   Transient Ischemic Attack     Problems CP and severe CAC on CT LHC 2/22: normal; no beta blocker due to bradycardia OSA not compliant with CPAP TIA 12/24 TTE 7/25: 55-60%, negative bubble study; mild RAE, mod RVE Referral for ziopatch to screen for Afib DVT on Eliquis  until 04/26 Former tobacco use 20PY (Quit 1985) MBETHA KEARNS, RN5  Visits  02/25: LP, Lpa, remind to stop ASA, RN to 20, F/u with ziopatch    Discussed the use of AI scribe software for clinical note transcription with the patient, who gave verbal consent to proceed.  History of Present Illness Zachary Jacobs is a 69 year old male with a history of TIA and arrhythmia who presents for cardiovascular evaluation and monitoring. He was referred by Dr. Skeet as part of a TIA workup.  He is currently on Eliquis  and has discontinued aspirin  and ibuprofen . He is scheduled to continue Eliquis  until at least April. Due to insurance changes, he does not currently have a cardiologist.  He has a history of arrhythmia, specifically an incomplete left bundle branch block. No history of chest pain is reported, but he occasionally experiences shortness of breath, which he attributes to asthma. A maintenance inhaler with a long-acting bronchodilator helps alleviate his shortness of breath.  His family history includes heart disease; his father had a significant heart attack in his mid-fifties. His mother died early from breast cancer.  He has a past history of smoking, having quit in 1985 or 1986 after smoking one pack per day for ten years.  No chest pain, but he reports occasional shortness of breath, which he attributes to asthma.   ROS: Please see the history of present illness. All other systems are reviewed and  negative.   Past Medical History:  Diagnosis Date   Allergy    Anemia    as child   COPD, mild (HCC)    very mild   History of kidney stones    OSA (obstructive sleep apnea)    s/p  surgery 1996   Pneumonia    Pre-diabetes    Renal calculus, left    Renal cyst, right    SIMPLE   Sleep apnea     cpap   dont wear latley    Past Surgical History:  Procedure Laterality Date   CYSTO/  RIGHT URETEROSCOPY/  STENT PLACEMENT  10/ 2001   CYSTOSCOPY/URETEROSCOPY/HOLMIUM LASER/STENT PLACEMENT Left 03/30/2015   Procedure: LEFT URETEROSCOPY/HOLMIUM LASER LITHOTRIPSY/STENT PLACEMENT, WITH  STONE BASKET EXTRACTION;  Surgeon: Mark Ottelin, MD;  Location: [facility] [place];  Service: Urology;  Laterality: Left;   HOLMIUM LASER APPLICATION Left 03/30/2015   Procedure: HOLMIUM LASER APPLICATION;  Surgeon: Mark Ottelin, MD;  Location: [facility];  Service: Urology;  Laterality: Left;   IR URETERAL STENT LEFT NEW ACCESS W/O SEP NEPHROSTOMY CATH  05/17/2019   LEFT HEART CATH AND CORONARY ANGIOGRAPHY N/A 06/15/2020   Procedure: LEFT HEART CATH AND CORONARY ANGIOGRAPHY;  Surgeon: Claudene Pacific, MD;  Location: [facility];  Service: Cardiovascular;  Laterality: N/A;   NEPHROLITHOTOMY Left 03/20/2015   Procedure: NEPHROLITHOTOMY PERCUTANEOUS;  Surgeon: Mark Ottelin, MD;  Location: [facility];  Service: Urology;  Laterality: Left;   NEPHROLITHOTOMY Left 05/17/2019  Procedure: NEPHROLITHOTOMY PERCUTANEOUS;  Surgeon: Ottelin, Mark, MD;  Location: [facility];  Service: Urology;  Laterality: Left;   TONSILLECTOMY     as child   UVULOPALATOPHARYNGOPLASTY  1996   nasal surgery    Current Outpatient Medications  Medication Sig Dispense Refill   apixaban  (ELIQUIS ) 5 MG TABS tablet Take 1 tablet (5 mg total) by mouth 2 (two) times daily. Start taking after completion of starter pack. 120 tablet 0   APIXABAN  (ELIQUIS ) VTE STARTER PACK (10MG  AND 5MG ) Take as directed on package: start with  two-5mg  tablets twice daily for 7 days. On day 8, switch to one-5mg  tablet twice daily. 74 each 0   fluticasone  (FLONASE ) 50 MCG/ACT nasal spray Place into the nose.     fluticasone  furoate-vilanterol (BREO ELLIPTA ) 200-25 MCG/ACT AEPB Inhale 1 puff into the lungs daily. 60 each 6   levalbuterol  (XOPENEX  HFA) 45 MCG/ACT inhaler Inhale 1 puff into the lungs every 6 (six) hours as needed for wheezing. 15 g 11   pantoprazole  (PROTONIX ) 40 MG tablet Take 1 tablet (40 mg total) by mouth daily. 30 tablet 2   rosuvastatin  (CRESTOR ) 5 MG tablet Take 1 tablet (5 mg total) by mouth daily. 30 tablet 5   silodosin  (RAPAFLO ) 8 MG CAPS capsule Take 1 capsule (8 mg total) by mouth daily. 90 capsule 3   Spacer/Aero-Holding Chambers DEVI 1 Device by Does not apply route once as needed for up to 1 dose (to use with rescue inhaler). 1 Canister 1   azelastine  (ASTELIN ) 0.1 % nasal spray Place 2 sprays into both nostrils 2 (two) times daily. (Patient not taking: Reported on 06/02/2024) 30 mL 12   No current facility-administered medications for this visit.    Allergies:   Patient has no known allergies.   Social History:  see above  Family History:  see above  PHYSICAL EXAM: VS:  BP 112/62 (BP Location: Left Arm, Patient Position: Sitting, Cuff Size: Large)   Pulse 65   Ht 5' 10 (1.778 m)   Wt 258 lb 12.8 oz (117.4 kg)   SpO2 97%   BMI 37.13 kg/m  , BMI Body mass index is 37.13 kg/m. GEN: Well nourished, well developed, in no acute distress HEENT: normal Neck: no JVD, carotid bruits, or masses Cardiac: RRR; no murmurs, rubs, or gallops,no edema  Respiratory:  CTAB bilaterally, normal work of breathing GI: soft, nontender, nondistended, + BS Extremities: No LE edema Skin: warm and dry, no rash Neuro:  Strength and sensation are intact  EKG: NSR  Recent Labs: Reviewed  Studies: Reviewed  Assessment & Plan  Transient ischemic attack Screened for atrial fibrillation and PFO. Previous bubble  study negative for PFO. - Ordered Zio patch to screen for atrial fibrillation. - Continue Eliquis  until at least April.  Hyperlipidemia Higher dose of cholesterol medication indicated due to stroke risk. - Ordered cholesterol check. - Increased dose of cholesterol medication.  Pulmonary embolism Increased stroke risk, but PFO unlikely due to negative bubble study.  Asthma Shortness of breath controlled with current medication. - Continue current asthma management with maintenance inhaler.   Signed, Joelle VEAR Ren Donley, MD  06/02/2024 2:05 PM    [facility] "

## 2024-06-03 LAB — LIPID PANEL+APOB
Apolipoprotein B: 70 mg/dL
Cholesterol, Total: 134 mg/dL (ref 100–199)
HDL-C: 44 mg/dL
LDL-C (NIH Calc): 55 mg/dL (ref 0–99)
Non-HDL Cholesterol: 90 mg/dL (ref 0–129)
Triglycerides: 219 mg/dL — ABNORMAL HIGH (ref 0–149)

## 2024-06-03 LAB — LIPOPROTEIN A (LPA)

## 2024-06-03 LAB — LDL CHOLESTEROL, DIRECT: LDL Direct: 56 mg/dL (ref 0–99)

## 2024-08-15 ENCOUNTER — Ambulatory Visit: Admitting: Student-PharmD

## 2024-12-02 ENCOUNTER — Ambulatory Visit: Payer: Self-pay | Admitting: Neurology
